# Patient Record
Sex: Male | Born: 1955 | Race: White | Hispanic: No | Marital: Single | Smoking: Former smoker
Health system: Southern US, Community
[De-identification: ages and names within clinical notes are randomized; demographics above are authoritative.]

## PROBLEM LIST (undated history)

## (undated) DIAGNOSIS — I219 Acute myocardial infarction, unspecified: Secondary | ICD-10-CM

## (undated) DIAGNOSIS — I639 Cerebral infarction, unspecified: Secondary | ICD-10-CM

## (undated) DIAGNOSIS — I10 Essential (primary) hypertension: Secondary | ICD-10-CM

## (undated) DIAGNOSIS — I6529 Occlusion and stenosis of unspecified carotid artery: Secondary | ICD-10-CM

## (undated) DIAGNOSIS — E785 Hyperlipidemia, unspecified: Secondary | ICD-10-CM

## (undated) DIAGNOSIS — I2510 Atherosclerotic heart disease of native coronary artery without angina pectoris: Secondary | ICD-10-CM

## (undated) DIAGNOSIS — I5022 Chronic systolic (congestive) heart failure: Secondary | ICD-10-CM

## (undated) DIAGNOSIS — I739 Peripheral vascular disease, unspecified: Secondary | ICD-10-CM

## (undated) DIAGNOSIS — E119 Type 2 diabetes mellitus without complications: Secondary | ICD-10-CM

## (undated) HISTORY — DX: Chronic systolic (congestive) heart failure: I50.22

## (undated) HISTORY — DX: Occlusion and stenosis of unspecified carotid artery: I65.29

## (undated) HISTORY — PX: ANKLE SURGERY: SHX546

## (undated) HISTORY — DX: Essential (primary) hypertension: I10

## (undated) HISTORY — DX: Hyperlipidemia, unspecified: E78.5

## (undated) HISTORY — DX: Type 2 diabetes mellitus without complications: E11.9

## (undated) HISTORY — DX: Atherosclerotic heart disease of native coronary artery without angina pectoris: I25.10

## (undated) HISTORY — DX: Acute myocardial infarction, unspecified: I21.9

## (undated) HISTORY — PX: OTHER SURGICAL HISTORY: SHX169

## (undated) HISTORY — DX: Cerebral infarction, unspecified: I63.9

## (undated) HISTORY — DX: Peripheral vascular disease, unspecified: I73.9

---

## 2000-02-04 ENCOUNTER — Inpatient Hospital Stay (HOSPITAL_COMMUNITY): Admission: AD | Admit: 2000-02-04 | Discharge: 2000-02-07 | Payer: Self-pay | Admitting: Cardiology

## 2000-04-19 ENCOUNTER — Encounter: Payer: Self-pay | Admitting: *Deleted

## 2000-04-19 ENCOUNTER — Inpatient Hospital Stay (HOSPITAL_COMMUNITY): Admission: EM | Admit: 2000-04-19 | Discharge: 2000-04-22 | Payer: Self-pay | Admitting: Emergency Medicine

## 2004-06-01 ENCOUNTER — Ambulatory Visit: Payer: Self-pay | Admitting: Family Medicine

## 2004-08-06 ENCOUNTER — Ambulatory Visit: Payer: Self-pay | Admitting: Family Medicine

## 2004-10-14 ENCOUNTER — Ambulatory Visit: Payer: Self-pay | Admitting: Family Medicine

## 2005-01-21 ENCOUNTER — Ambulatory Visit: Payer: Self-pay | Admitting: Family Medicine

## 2005-05-12 ENCOUNTER — Ambulatory Visit: Payer: Self-pay | Admitting: Family Medicine

## 2006-03-09 ENCOUNTER — Ambulatory Visit: Payer: Self-pay | Admitting: Family Medicine

## 2006-06-22 ENCOUNTER — Ambulatory Visit: Payer: Self-pay | Admitting: Family Medicine

## 2010-04-15 ENCOUNTER — Encounter: Payer: Self-pay | Admitting: Gastroenterology

## 2010-05-11 ENCOUNTER — Encounter (INDEPENDENT_AMBULATORY_CARE_PROVIDER_SITE_OTHER): Payer: Self-pay | Admitting: *Deleted

## 2010-05-13 ENCOUNTER — Ambulatory Visit
Admission: RE | Admit: 2010-05-13 | Discharge: 2010-05-13 | Payer: Self-pay | Source: Home / Self Care | Attending: Gastroenterology | Admitting: Gastroenterology

## 2010-05-13 ENCOUNTER — Encounter (INDEPENDENT_AMBULATORY_CARE_PROVIDER_SITE_OTHER): Payer: Self-pay | Admitting: *Deleted

## 2010-05-25 ENCOUNTER — Telehealth (INDEPENDENT_AMBULATORY_CARE_PROVIDER_SITE_OTHER): Payer: Self-pay | Admitting: *Deleted

## 2010-05-27 ENCOUNTER — Ambulatory Visit: Admit: 2010-05-27 | Payer: Self-pay | Admitting: Gastroenterology

## 2010-05-27 ENCOUNTER — Other Ambulatory Visit: Payer: Self-pay | Admitting: Gastroenterology

## 2010-05-27 ENCOUNTER — Other Ambulatory Visit (AMBULATORY_SURGERY_CENTER): Payer: BC Managed Care – PPO | Admitting: Gastroenterology

## 2010-05-27 DIAGNOSIS — Z1211 Encounter for screening for malignant neoplasm of colon: Secondary | ICD-10-CM

## 2010-05-27 DIAGNOSIS — K648 Other hemorrhoids: Secondary | ICD-10-CM

## 2010-05-27 DIAGNOSIS — D126 Benign neoplasm of colon, unspecified: Secondary | ICD-10-CM

## 2010-05-27 DIAGNOSIS — Z800 Family history of malignant neoplasm of digestive organs: Secondary | ICD-10-CM

## 2010-05-28 NOTE — Miscellaneous (Signed)
Summary: [facility] previsit  Clinical Lists Changes  Medications: Added new medication of MOVIPREP 100 GM  SOLR (PEG-KCL-NACL-NASULF-NA ASC-C) As per prep instructions. - Signed Rx of MOVIPREP 100 GM  SOLR (PEG-KCL-NACL-NASULF-NA ASC-C) As per prep instructions.;  #1 x 0;  Signed;  Entered by: Karl Bales RN;  Authorized by: Meryl Dare MD [facility];  Method used: Electronically to [facility] [id number]*, [address] [place] [place], [place]  [address], Ph: [phone], Fax: [phone] Observations: Added new observation of NKA: T (05/13/2010 11:59)    Prescriptions: MOVIPREP 100 GM  SOLR (PEG-KCL-NACL-NASULF-NA ASC-C) As per prep instructions.  #1 x 0   Entered by:   Karl Bales RN   Authorized by:   Meryl Dare MD [facility]   Signed by:   Karl Bales RN on 05/13/2010   Method used:   Electronically to        [facility] [phone]* (retail)       [address] [place].       [place], [place]  [address]       Ph: [phone]       Fax: [phone]   RxID:   [id number]

## 2010-05-28 NOTE — Letter (Signed)
Summary: [facility] Instructions  [facility]  [address] [place], [place] [address]   Phone: [phone]  Fax: [phone]       DARNELLE CORP    1955-10-30    MRN: [id number]        Procedure Day Dorna Bloom:  Wednesday 05/27/2010     Arrival Time:  9:30 am     Procedure Time: 10:30 am     Location of Procedure:                    _ x_  [facility] (4th Floor)                        PREPARATION FOR COLONOSCOPY WITH MOVIPREP   Starting 5 days prior to your procedure Friday 1/27 do not eat nuts, seeds, popcorn, corn, beans, peas,  salads, or any raw vegetables.  Do not take any fiber supplements (e.g. Metamucil, Citrucel, and Benefiber).  THE DAY BEFORE YOUR PROCEDURE         DATE: Tuesday 1/31  1.  Drink clear liquids the entire day-NO SOLID FOOD  2.  Do not drink anything colored red or purple.  Avoid juices with pulp.  No orange juice.  3.  Drink at least 64 oz. (8 glasses) of fluid/clear liquids during the day to prevent dehydration and help the prep work efficiently.  CLEAR LIQUIDS INCLUDE: Water Jello Ice Popsicles Tea (sugar ok, no milk/cream) Powdered fruit flavored drinks Coffee (sugar ok, no milk/cream) Gatorade Juice: apple, white grape, white cranberry  Lemonade Clear bullion, consomm, broth Carbonated beverages (any kind) Strained chicken noodle soup Hard Candy                             4.  In the morning, mix first dose of MoviPrep solution:    Empty 1 Pouch A and 1 Pouch B into the disposable container    Add lukewarm drinking water to the top line of the container. Mix to dissolve    Refrigerate (mixed solution should be used within 24 hrs)  5.  Begin drinking the prep at 5:00 p.m. The MoviPrep container is divided by 4 marks.   Every 15 minutes drink the solution down to the next mark (approximately 8 oz) until the full liter is complete.   6.  Follow completed prep with 16 oz of clear liquid of your choice (Nothing  red or purple).  Continue to drink clear liquids until bedtime.  7.  Before going to bed, mix second dose of MoviPrep solution:    Empty 1 Pouch A and 1 Pouch B into the disposable container    Add lukewarm drinking water to the top line of the container. Mix to dissolve    Refrigerate  THE DAY OF YOUR PROCEDURE      DATE: Wednesday 2/1  Beginning at 5:30 a.m. (5 hours before procedure):         1. Every 15 minutes, drink the solution down to the next mark (approx 8 oz) until the full liter is complete.  2. Follow completed prep with 16 oz. of clear liquid of your choice.    3. You may drink clear liquids until 8:30 am (2 HOURS BEFORE PROCEDURE).   MEDICATION INSTRUCTIONS  Unless otherwise instructed, you should take regular prescription medications with a small sip of water   as early as possible the morning of  your procedure.  Diabetic patients - see separate instructions.           OTHER INSTRUCTIONS  You will need a responsible adult at least 56 years of age to accompany you and drive you home.   This person must remain in the waiting room during your procedure.  Wear loose fitting clothing that is easily removed.  Leave jewelry and other valuables at home.  However, you may wish to bring a book to read or  an iPod/MP3 player to listen to music as you wait for your procedure to start.  Remove all body piercing jewelry and leave at home.  Total time from sign-in until discharge is approximately 2-3 hours.  You should go home directly after your procedure and rest.  You can resume normal activities the  day after your procedure.  The day of your procedure you should not:   Drive   Make legal decisions   Operate machinery   Drink alcohol   Return to work  You will receive specific instructions about eating, activities and medications before you leave.    The above instructions have been reviewed and explained to me by   Karl Bales RN  May 13, 2010 12:40 PM    I fully understand and can verbalize these instructions _____________________________ Date _________

## 2010-05-28 NOTE — Letter (Signed)
Summary: Diabetic Instructions  [facility]  [address]   [place], [place] [address]   Phone: [phone]  Fax: [phone]    Grant Moran [birth year] MRN: [id number]    x   ORAL DIABETIC MEDICATION INSTRUCTIONS  The day before your procedure:   Take your diabetic pill as you do normally  The day of your procedure:   Do not take your diabetic pill    We will check your blood sugar levels during the admission process and again in Recovery before discharging you home  ________________________________________________________________________

## 2010-05-28 NOTE — Letter (Signed)
Summary: Pre Visit Letter Revised  [facility]  [address] [place], [place] [address]   Phone: [phone]  Fax: [phone]        04/15/2010 MRN: [id number]  Grant Moran [address] [place], [place]  [address]             Procedure Date:  05-27-10 10:30am           Dr Russella Dar - Direct Colon      Welcome to the Gastroenterology Division at [facility].    You are scheduled to see a nurse for your pre-procedure visit on 05-13-2010 at 1pm on the 3rd floor at [facility], [address]  We ask that you try to arrive at our office 15 minutes prior to your appointment time to allow for check-in.  Please take a minute to review the attached form.  If you answer "Yes" to one or more of the questions on the first page, we ask that you call the person listed at your earliest opportunity.  If you answer "No" to all of the questions, please complete the rest of the form and bring it to your appointment.    Your nurse visit will consist of discussing your medical and surgical history, your immediate family medical history, and your medications.   If you are unable to list all of your medications on the form, please bring the medication bottles to your appointment and we will list them.  We will need to be aware of both prescribed and over the counter drugs.  We will need to know exact dosage information as well.    Please be prepared to read and sign documents such as consent forms, a financial agreement, and acknowledgement forms.  If necessary, and with your consent, a friend or relative is welcome to sit-in on the nurse visit with you.  Please bring your insurance card so that we may make a copy of it.  If your insurance requires a referral to see a specialist, please bring your referral form from your primary care physician.  No co-pay is required for this nurse visit.     If you cannot keep your appointment, please call [phone] to cancel or reschedule prior to  your appointment date.  This allows [place] the opportunity to schedule an appointment for another patient in need of care.    Thank you for choosing [facility] for your medical needs.  We appreciate the opportunity to care for you.  Please visit [place] at our website  to learn more about our practice.  Sincerely, The Gastroenterology Division

## 2010-06-01 ENCOUNTER — Encounter: Payer: Self-pay | Admitting: Gastroenterology

## 2010-06-01 LAB — GLUCOSE, CAPILLARY
Glucose-Capillary: 215 mg/dL — ABNORMAL HIGH (ref 70–99)
Glucose-Capillary: 150 mg/dL — ABNORMAL HIGH (ref 70–99)

## 2010-06-03 NOTE — Procedures (Addendum)
Summary: Colonoscopy  Patient: Grant Moran Note: All result statuses are Final unless otherwise noted.  Tests: (1) Colonoscopy (COL)   COL Colonoscopy           DONE (C)     [facility]     [address]     [place], [place]  [address]           COLONOSCOPY PROCEDURE REPORT     PATIENT:  Grant Moran, Grant Moran  MR#:  [id number]     BIRTHDATE:  July 04, 1955, 54 yrs. old  GENDER:  male     ENDOSCOPIST:  Judie Petit T. Russella Dar, MD, [facility]     Referred by:  Joette Catching, M.D.     PROCEDURE DATE:  05/27/2010     PROCEDURE:  Colonoscopy with snare polypectomy     ASA CLASS:  Class II     INDICATIONS:  1) Routine Risk Screening 2) Family history of colon     cancer: mother age 24 and MGM age 52.     MEDICATIONS:   Fentanyl 75 mcg IV, Versed 7 mg IV     DESCRIPTION OF PROCEDURE:   After the risks benefits and     alternatives of the procedure were thoroughly explained, informed     consent was obtained.  Digital rectal exam was performed and     revealed no abnormalities.   The LB [id number]-[id number] endoscope     was introduced through the anus and advanced to the cecum, which     was identified by both the appendix and ileocecal valve, without     limitations.  The quality of the prep was good, using MoviPrep.     The instrument was then slowly withdrawn as the colon was fully     examined.     <<PROCEDUREIMAGES>>     FINDINGS:  A sessile polyp was found in the mid transverse colon.     It was 10 mm in size. Polyp was snared, then cauterized with     monopolar cautery. Retrieval was successful. A normal appearing     cecum, ileocecal valve, and appendiceal orifice were identified.     The ascending, hepatic flexure, splenic flexure, descending,     sigmoid colon, and rectum appeared unremarkable. Retroflexed views     in the rectum revealed internal hemorrhoids, small. The time to     cecum =  1.33  minutes. The scope was then withdrawn (time =  9.5     min) from the patient and the procedure  completed.           COMPLICATIONS:  None           ENDOSCOPIC IMPRESSION:     1) 10 mm sessile polyp in the mid transverse colon     2) Internal hemorrhoids           RECOMMENDATIONS:     1) Avoid all NSAIDS for the next 2 weeks.     2) Await pathology results     3) If the polyp is adenomatous (pre-cancerous), colonoscopy in 3     years. Otherwise follow colorectal cancer screening guidelines for     "elevated risk" patients with a colonoscopy in 5 years.           Venita Lick. Russella Dar, MD, [facility]           n.     REVISED:  05/27/2010 11:35 AM     eSIGNED:   Venita Lick. Russella Dar at 05/27/2010  11:35 AM           Grant Moran, Grant Moran, [id number]  Note: An exclamation mark (!) indicates a result that was not dispersed into the flowsheet. Document Creation Date: 05/27/2010 11:36 AM _______________________________________________________________________  (1) Order result status: Final Collection or observation date-time: 05/27/2010 11:23 Requested date-time:  Receipt date-time:  Reported date-time:  Referring Physician:   Ordering Physician: Claudette Head [id number]) Specimen Source:  Source: Launa Grill Order Number: [id number] Lab site:   Appended Document: Colonoscopy     Procedures Next Due Date:    Colonoscopy: 05/2013

## 2010-06-03 NOTE — Progress Notes (Signed)
Summary: Prep  Phone Note Call from Patient Call back at Home Phone [phone]   Caller: Patient Call For: Dr. Russella Dar Reason for Call: Talk to Nurse Summary of Call: Pt needs [facility] Initial call taken by: [place] Johnson,  May 25, 2010 9:08 AM  Follow-up for Phone Call        [facility] MoviPrep [facility] [place], [place].  Pt. notified. Follow-up by: Ezra Sites RN,  May 25, 2010 9:27 AM

## 2010-06-11 NOTE — Letter (Signed)
Summary: Patient Notice- Polyp Results  [facility]  [address] [place], [place] [address]   Phone: [phone]  Fax: [phone]        June 01, 2010 MRN: [id number]    Grant Moran [address] [place], [place]  [address]    Dear Grant Moran,  I am pleased to inform you that the colon polyp(s) removed during your recent colonoscopy was (were) found to be benign (no cancer detected) upon pathologic examination.  I recommend you have a repeat colonoscopy examination in 3 years to look for recurrent polyps, as having colon polyps increases your risk for having recurrent polyps or even colon cancer in the future.  Should you develop new or worsening symptoms of abdominal pain, bowel habit changes or bleeding from the rectum or bowels, please schedule an evaluation with either your primary care physician or with me.  Continue treatment plan as outlined the day of your exam.  Please call us if you are having persistent problems or have questions about your condition that have not been fully answered at this time.  Sincerely,  Meryl Dare MD [facility]  This letter has been electronically signed by your physician.  Appended Document: Patient Notice- Polyp Results LETTER MAILED

## 2010-09-11 NOTE — Cardiovascular Report (Signed)
[facility]  Patient:    Grant Moran, Grant Moran                        MRN: [id number] Proc. Date: 02/04/00 Adm. Date:  [id number] Attending:  Learta Codding CC:         Luis Abed, M.D. [facility]  Delaney Meigs, M.D.   Cardiac Catheterization  PROCEDURES PERFORMED: 1. Left heart catheterization. 2. Left ventriculogram. 3. Selective coronary angiography. 4. Percutaneous transluminal coronary angioplasty and stenting of the    right coronary artery.  DIAGNOSES: 1. Two-vessel coronary artery disease. 2. Mild left ventricular systolic dysfunction. 3. Acute inferior wall myocardial infarction.  INDICATIONS:  Mr. Choyce is a 55 year old white male with multiple cardiac risks factors who presented to [facility] with substernal chest discomfort.  The patient awoke with discomfort that persisted throughout the day.  He presented to the emergency room early afternoon and was found to have ST elevation of the inferior leads consistent with an acute inferior wall myocardial infarction.  Cardiac enzymes were elevated as well.  The patient was given Integrilin and heparin and transferred to [facility] for urgent left heart catheterization.  TECHNIQUE:  After informed consent was obtained, the patient was brought to the cardiac catheterization lab where both groins were sterilely prepped and draped.  Lidocaine 1% was used to infiltrate the right groin.  The 5 French venous and 7 French arterial sheaths placed using modified Seldinger technique.  The 6 [place] and JR4 catheters were then used to engage the left and right coronary arteries, and selective angiography performed in various projections using manual injections of contrast.  A 6 French pigtail catheter was then advanced to the left ventricle and the left ventriculogram performed using power injections of contrast.  FINDINGS:  Initial findings are as follows: 1. Left main trunk:  The  left main trunk is a large caliber vessel with    mild irregularities. 2. Left anterior descending:  This is a large caliber vessel that provides two    diagonal branches.  The LAD has a high-grade narrowing of 70% in the    proximal segment. 3. Left circumflex artery:  This is a medium caliber vessel that provides    four marginal branches.  The left circumflex system has mild diffuse    disease with narrowings of 30% after the second marginal branch. 4. Ramus intermedius:  A medium caliber vessel with mild irregularities. 5. Right coronary artery:  A large caliber vessel that provides the posterior    descending artery and several posterior ventricular branches in its    terminal segment.  The right coronary artery has a long segment of severe    diffuse disease in the mid section with narrowings up to 95%.  This is an    eccentric and ulcerated looking lesion.  In addition there was further    narrowing of 70% in the distal RCA prior to the bifurcation with the PDA    and posterior ventricular branches of 70% with significant stepdown in the    vessel and possible intrinsic dissection.  LEFT VENTRICULOGRAM:  Normal end-systolic and end-diastolic dimensions. Overall left ventricular function is mildly impaired.  Ejection fraction approximately 40%.  Severe hypokinesis of the inferior wall.  No mitral regurgitation.  LV pressure is 113/10, aortic is 113/70.  LVEDP equals 25.  With these findings we elected to proceed with percutaneous intervention of the right coronary artery.  A 7 [place] JR4 guide catheter was used to engage the right coronary artery and a .014 inch Patriot wire advanced in the distal RCA.  A 3.5 x 20 mm Quantum balloon was introduced.  A single inflation was performed in the distal section of the lesion at 8 atmospheres for 30 seconds. Two additional inflations were performed in the mid and proximal segments of the lesion in the mid RCA at 12 atmospheres for 30  seconds.  Repeat angiography showed some improvement in vessel diameter.  However, there was evidence of dissection along the entire length of the mid RCA.  A 5.0 x 47 mm wall stent was introduced and carefully positioned in the mid RCA to cover the length of the lesion as well as the dissection deployed.  A 4.0 x 30 mm Maverick balloon was introduced and used to dilate the distal edge of the stent at 6 atmospheres for 30 seconds.  The mid section had 10 atmospheres for 30 seconds and the proximal segment had 12 atmospheres for 60 seconds.  There remained waste in the proximal mid section of the stent and a 4.0 x 15 mm Quantum Ranger balloon was introduced.  This was used to dilate the distal sections of the stent at 14 atmospheres x 30 seconds for two inflations in the proximal segment of the stent at 14 atmospheres for 30 seconds and within the mid section of the stent at up to 18 atmospheres for 30 seconds.  Repeat angiography showed an excellent result with only mild residual narrowing of less than 10% and full apposition of the stent.  Attention was then turned to the distal RCA lesion.  A second Patriot wire was advanced into the posterior descending artery in order to protect the bifurcation.  A 3.5 x 20 mm Quantum Ranger balloon was introduced and used to dilate the distal RCA prior to the bifurcation at 6 atmospheres for 120 seconds.  Repeat angiography showed mild improvement in vessel diameter.  However, there is persistence of vessel defect and it is felt that stenting would be necessary.  A 3.5 x 12 mm NIR stent was carefully positioned in the distal RCA and deployed just prior to the bifurcation with the PDA and posterior ventricular branch at 12 atmospheres for 45 seconds.  Repeat angiography showed vessel spasm and a diseased segment of the posterior ventricular branch just distal to the takeoff from the posterior descending artery.  A 3.5 x 20 mm Quantum balloon  was  positioned across the bifurcation into the posterior ventricular branch and used to dilate this segment at 4 atmospheres for 60 seconds.  Repeat angiography after administration of intracoronary nitroglycerin showed an excellent result with no residual stenosis in the distal RCA and excellent flow into the posterior descending artery and posterior ventricular branch with only mild residual narrowing.  Final angiography was performed in various projections showing TIMI-3 flow throughout the right coronary artery.  No residual vessel damage and only mild residual stenosis at the site of severe disease in the mid section of the RCA.  The guide catheter was then removed and the sheaths secured into position.  The patient tolerated the procedure well and was transferred to the ward in stable condition.  The sheath will be removed when the ACT returns to normal.  FINAL RESULTS: 1. Successful percutaneous transluminal coronary angioplasty and stenting    of the mid right coronary artery with reduction of a 95% narrowing    to less than 10% with placement  of a 5.0 x 47 mm wall stent dilated    to 4 mm. 2. Successful percutaneous transluminal coronary angioplasty and stenting    of the right coronary artery with reduction of 70% narrowing to 0%    with placement of a 3.5 x 12 mm NIR Elite stent.  ASSESSMENT AND PLAN:  Mr. Sanor is a 55 year old gentleman with severe two-vessel coronary artery disease, who has suffered an acute inferior wall myocardial infarction.  At this point he has been revascularized in the right coronary artery territory.  Further clinical assessment of the left anterior descending lesion will be made once the patient recovers from this procedure. DD:  02/04/00 TD:  02/05/00 Job: [id number]

## 2010-09-11 NOTE — Cardiovascular Report (Signed)
[facility]  Patient:    Grant Moran, Grant Moran                        MRN: [id number] Proc. Date: 04/20/00 Adm. Date:  [id number] Attending:  Rollene Rotunda CC:         Delaney Meigs, M.D.             Veneda Melter, M.D. [facility]             Rollene Rotunda, M.D. [facility]             [facility]                        Cardiac Catheterization  INDICATIONS:  [name] is a pleasant 55 year old who presents with recurrent unstable angina.  He has previously had a stent placed in his proximal right coronary artery over a long area.  He is also diabetic.  The current study was done to assess recurrent angina.  PROCEDURE 1. Left heart catheterization. 2. Selective coronary arteriography. 3. Selective left ventriculography. 4. Subclavian angiography.  DESCRIPTION OF PROCEDURE:  The procedure was performed from the right femoral artery using 6-French catheters.  He tolerated the procedure without complication.  He was taken to the holding area in satisfactory clinical condition.  Importantly, it was our feeling that he needed repeat dilatation of the right coronary stent because of long in-stent restenosis; however, the patient needs brachytherapy of the right coronary artery, and this is scheduled for Thursday afternoon on scheduled brachytherapy cases.  HEMODYNAMIC DATA:  Central aortic pressure 123/73.  LV pressure 125/2.  No gradient on pullback across the aortic valve.  ANGIOGRAPHIC DATA:  The left main coronary artery is short but free of critical disease.  The LAD demonstrates a long area of narrowing; it is probably from 50% to 70% in terms of luminal reduction.  This was seen on the previous angiographic study.  The distal vessel was a large-caliber vessel and free of critical disease and suitable for grafting.  The circumflex provides two marginal branches and terminates as an A-V circumflex.  There is perhaps 30-40% narrowing in the A-V  circumflex beyond the second marginal takeoff.  There is mild plaquing of the proximal second marginal without significant luminal reduction.  The right coronary artery demonstrates a long stent.  There is a long area of in-stent restenosis.  The stenosis is nearly subtotal and involves the area just proximal to the stent.  There is 80% in-stent restenosis in the proximal half of the stent.  The distal stent is widely patent.  There is also some bifurcational disease with decreased density at the bifurcation of the distal A-V portion of the right coronary artery and the posterior descending takeoff. The PDA takeoff and the A-V takeoff both have about 30% to 30-40% luminal reduction.  There is about 50% mid-PDA disease.  The subclavian and internal mammary appear to be widely patent.  Ventriculography in the RAO projection reveals inferobasal hypokinesis. Ejection fraction was 55%.  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  DISPOSITION:  The patient will be scheduled for repeat balloon dilatation with brachytherapy with Dr. Veneda Melter tomorrow.  We have added Plavix to his regimen. DD:  04/20/00 TD:  04/20/00 Job: [id number]

## 2010-09-11 NOTE — Cardiovascular Report (Signed)
[facility]  Patient:    Grant Moran, Grant Moran                        MRN: [id number] Proc. Date: 04/21/00 Adm. Date:  [id number] Attending:  Rollene Rotunda CC:         Rollene Rotunda, M.D. [facility]  Delaney Meigs, M.D.   Cardiac Catheterization  PROCEDURES PERFORMED: 1. Selective coronary angiography. 2. Percutaneous transluminal coronary angioplasty of the mid    right coronary artery. 3. Administration of brachytherapy of the right coronary artery.  DIAGNOSES: 1. Severe single-vessel coronary artery disease. 2. 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  INDICATIONS:  [name] is a 55 year old white male with advanced coronary artery disease, who had suffered an inferior wall myocardial infarction on February 04, 2000.  At that time he had acute intervention of the right coronary artery with placement of a stent in the mid section.  He recently presented with recurrence of chest discomfort and on catheterization April 20, 2000, was found to have severe narrowings of 97% in the proximal segment of the stent consistent with in-stent re-stenosis.  He presents now for percutaneous intervention.  TECHNIQUE:  After informed consent was obtained from the patient, and he was brought to the cardiac catheterization lab.  An 8 French sheath was placed in the right femoral artery.  An 8 [place] JR4 guide catheter was used as was a long BMW wire.  The patient received heparin and Integrilin on a weight-adjusted basis.  He had previously been started on aspirin and Plavix. A 4.0 x 10 mm Cutting Balloon was then introduced and multiple inflations performed in the mid RCA in the area of severe narrowing and up to 10 atmospheres for 60 seconds.  Repeat angiography showed significant improvement in vessel lumen with only mild residual narrowing of 20% and no evidence of vessel damage.  Preparations were then made for brachytherapy and the delivery catheter positioned across the  stenosis.  Beta-radiation was then delivered using the Novoste system for dual time of four minutes.  Repeat angiography showed no evidence of vessel damage and TIMI-3 flow throughout the right coronary artery.  The guide catheter was then removed and the sheaths secured into position.  The patient tolerated the procedure well and was transferred to the floor in stable condition.  FINAL RESULTS:  Successful percutaneous transluminal coronary angioplasty of the mid right coronary artery with reduction of 90% in-stent re-stenosis to less than 20% with a 4 mm Cutting Balloon and adjuvant administration of beta-radiation brachytherapy. DD:  04/21/00 TD:  04/21/00 Job: [id number]

## 2010-09-11 NOTE — Discharge Summary (Signed)
. [facility]  Patient:    Grant Moran, PAVON                        MRN: [id number] Adm. Date:  [id number] Disc. Date: 04/22/00 Attending:  Rollene Rotunda Dictator:   Joellyn Rued, P.A.-C. CC:         Dr. Sheila Oats - [address], [place], [place] [address]                           Discharge Summary  DATE OF BIRTH:  1956/02/18  HISTORY OF PRESENT ILLNESS:  [name] is a 55 year old white male who presented to [facility] Emergency Room, complaining of chest discomfort.  He developed right-sided anterior chest sharp pains, radiating into the right arm as numbing.  He found it similar to his inferior myocardial infarction in October 2001, but "this is not as bad."  He said that the discomfort started yesterday, and lasted for only 10 minutes.  He had taken Maalox; however, the discomfort recurred at around 5:30 a.m. while eating breakfast.  He took more Maalox and Tums.  Throughout the day it has been "coming and going" all day.  Throughout the day the discomfort has been anywhere from a 0 to a 5 on a scale of 0/10.  The discomfort is worse with exertion, better with rest.  He did use one sublingual nitroglycerin today, but without relief.  He states that he is reluctant to use nitroglycerin. Associated with this is some nausea.  He has had some slight coughing, and some shortness of breath.  He denies any diaphoresis or vomiting.  PAST MEDICAL HISTORY: 1. Notable for remote tobacco abuse, with a prior three-pack-per-day-history    for 20 years.  He has not smoked since 1992. 2. Notable for non-insulin-dependent diabetes mellitus. 3. Hypertension. 4. Hyperlipidemia.  The last cholesterol checked was in October.  Total    cholesterol 132, triglycerides 229, HDL 22, LDL 64. 5. Inferior myocardial infarction in October 2001.  A cardiac    catheterization showed a long mid-95% RCA, a 70% distal PDA, a  70%    proximal LAD, a 30% lesion, post the OM-II, with an ejection fraction of    40%, and inferior hypokinesis.  He underwent an angioplasty and stenting    of the RCA and distal RCA.  LABORATORY DATA:  On presentation to the emergency room the CPK was 169 with MB of 4.5, relative index of 2.7, troponin 0.09.  CPKs rose to a peak of 150, MB 9.3, relative index 6.2, and troponin 0.56, approximately eight hours after admission.  Fasting lipids showed a total cholesterol of 134, triglycerides of 205, HDL 31, LDL 62.  H&H was 14.5 and 39.0, normal MCV.  The MCHC was slightly elevated at 37.2, platelets 268, wbcs 9.0.  PT 11.9, PTT 68.  Sodium in the emergency room was 138, potassium 4.3, BUN 11, creatinine 1.0.  Electrocardiogram on admission showed normal sinus rhythm, some nonspecific ST-T wave changes.  An electrocardiogram on April 20, 2000, showed the development of Q-waves in lead III and aVF.  Lead II had 1.0 to 2.0 mm inferior ST segment elevation.  There was T-wave inversion in lead III and aVF.  Further electrocardiograms showed persistent Q-waves in lead III and aVF and T-wave inversion.  A chest x-ray showed poor inspiration, no active disease.  HOSPITAL COURSE:  Mr. [name] was admitted to the hospital.  He was placed on IV nitroglycerin and heparin.  His home medications were continued except for his GlucoVance.  On April 19, 2000, it was noted that he complained of chest discomfort, and his nitroglycerin was increased, with a resolution of his discomfort.  On April 20, 2000, his enzymes and electrocardiograms were positive for an inferior myocardial infarction.  The cardiac catheterization on April 20, 2000, by Dr. Arturo Morton. Stuckey.  According to his progress note, this showed a 99% in-stent restenosis of the proximal RCA.  He had a distal 30% PDA and a 30%-40% posterolateral, a distal 50% PDA.  His proximal lesion in the LAD was estimated as between 50%-70%,  and the distal circumflex was 30%-50%.  The ejection fraction was 55% with inferior hypokinesis.  Films were reviewed, and it was felt that he should undergo radiation treatment for his RCA stent restenosis.  Radiology/oncology saw him on April 21, 2000. That afternoon Dr. Veneda Melter performed an angioplasty to the RCA in-stent restenosis, reducing this from 90% to 20%.  Beta radiation was also performed with a four-minute dwell time.  DISPOSITION:  By April 22, 2000, he was ambulating without difficulty.  The cardiac catheterization site was intact.  Dr. Chales Abrahams reviewed the chart, and felt that he could be discharged home.  DISCHARGE DIAGNOSES: 1. Inferior myocardial infarction.  He had a small enzyme peak; however,    his electrocardiogram did develop inferior Q-waves, status post    angioplasty of the in-stent restenosis of the right coronary artery,    associated with beta radiation. 2. Hyperlipidemia. 3. History as previously.  DISCHARGE MEDICATIONS: 1. Plavix 75 mg q.d. for six months. 2. Niacin was increased to 1000 mg b.i.d. 3. He was asked to resume his GlucoVance 5/500 mg two tablets q.a.m.,    one tablet q.p.m. on April 23, 2000. 4. He is asked to continue his coated aspirin 325 mg q.d. 5. Monopril 20 mg q.d. 6. Toprol XL 100 mg q.d. 7. Sublingual nitroglycerin p.r.n.  INSTRUCTIONS:  He was advised no lifting, driving, sexual activity, or heavy exertion for two days, and he was given permission to return to work on Monday.  If he has any problems with his cardiac catheterization site, he is instructed to call.  DIET:  He is to maintain a low-salt, low-fat, low-cholesterol, ADA diet.  FOLLOWUP:  He will see Dr. Andee Lineman in the [facility] on May 16, 2000, at 2 p.m. DD:  04/22/00 TD:  04/22/00 Job: [id number]

## 2011-03-26 ENCOUNTER — Encounter: Payer: Self-pay | Admitting: Cardiovascular Disease

## 2011-11-16 ENCOUNTER — Ambulatory Visit (INDEPENDENT_AMBULATORY_CARE_PROVIDER_SITE_OTHER): Payer: BC Managed Care – PPO | Admitting: Cardiovascular Disease

## 2011-11-16 ENCOUNTER — Encounter: Payer: Self-pay | Admitting: Cardiovascular Disease

## 2011-11-16 VITALS — BP 112/74 | HR 84 | Resp 12 | Ht 64.0 in | Wt 203.8 lb

## 2011-11-16 DIAGNOSIS — I2510 Atherosclerotic heart disease of native coronary artery without angina pectoris: Secondary | ICD-10-CM | POA: Insufficient documentation

## 2011-11-16 HISTORY — DX: Atherosclerotic heart disease of native coronary artery without angina pectoris: I25.10

## 2011-11-16 NOTE — Patient Instructions (Addendum)
Your physician wants you to follow-up in: 1 year. You will receive a reminder letter in the mail two months in advance. If you don't receive a letter, please call our office to schedule the follow-up appointment.  

## 2011-11-16 NOTE — Progress Notes (Signed)
History of Present Illness: 56 yo male with history of CAD, HLD, HTN who is here today as a new patient for cardiac evaluation. He was seen by Dr. Antoine Poche 10 years ago but has not been seen by cardiology since. His cardiac history dates back to 2001 when he presented with an inferior MI. His RCA had an ulcerated lesion treated with a bare metal stent. He was also noted to have a 70% proximal LAD lesion, 30% OM2, 70% PDA, 70% PLA, 70% distal RCA. He was readmitted 04/21/2000 with chest pain and had severe restenosis RCA stent treated with angioplasty and brachytherapy. Lost to cardiology follow up since then with no workup. He has not been taking ASA due to ulcers and off of Plavix, statin and Ace-inh.   He feels well. No chest pain, SOB or palpitations. ROS o/w negative. He works in Education officer, environmental and rides horses for fun. Not married. Former smoker. He does not wish to have any cardiac testing.   Primary Care Physician: [facility]   Past Medical History  Diagnosis Date  . Hypertension   . Hyperlipidemia   . Myocardial infarction     OCT 2001-He under went angioplasty  and  stenting  of the RCA and distal RCA   . Diabetes mellitus   . CAD (coronary artery disease)     Past Surgical History  Procedure Date  . Ankle surgery     Trauma    Current Outpatient Prescriptions on File Prior to Visit  Medication Sig Dispense Refill  . glyBURIDE-metformin (GLUCOVANCE) 5-500 MG per tablet Take 1 tablet by mouth 2 (two) times daily with a meal.      . metoprolol succinate (TOPROL-XL) 100 MG 24 hr tablet Take 100 mg by mouth daily. Take with or immediately following a meal.      . nitroGLYCERIN (NITROSTAT) 0.4 MG SL tablet Place 0.4 mg under the tongue every 5 (five) minutes as needed.                                       No Known Allergies  History   Social History  . Marital Status: Single    Spouse Name: N/A    Number of Children: 0  . Years of Education: N/A   Occupational History    . Textile plant    Social History Main Topics  . Smoking status: Former Smoker -- 2.0 packs/day for 20 years    Types: Cigarettes    Quit date: 06/18/1990  . Smokeless tobacco: Not on file  . Alcohol Use: No  . Drug Use: No  . Sexually Active: Not on file   Other Topics Concern  . Not on file   Social History Narrative   Notable for remote tobacco abuse, with a prior three-pack per - day - history  For 20 years . He  Has not smoked since 1992.    Family History  Problem Relation Age of Onset  . Cancer Mother     Colon  . Heart attack Father     CABG  . Heart attack Sister     Review of Systems:  As stated in the HPI and otherwise negative.   BP 112/74  Pulse 84  Resp 12  Ht 5\' 4"  (1.626 m)  Wt 203 lb 12.8 oz (92.443 kg)  BMI 34.98 kg/m2  Physical Examination: General: Well developed, well nourished, NAD HEENT:  OP clear, mucus membranes moist SKIN: warm, dry. No rashes. Neuro: No focal deficits Musculoskeletal: Muscle strength 5/5 all ext Psychiatric: Mood and affect normal Neck: No JVD, no carotid bruits, no thyromegaly, no lymphadenopathy. Lungs:Clear bilaterally, no wheezes, rhonci, crackles Cardiovascular: Regular rate and rhythm. No murmurs, gallops or rubs. Abdomen:Soft. Bowel sounds present. Non-tender.  Extremities: No lower extremity edema. Pulses are 2 + in the bilateral DP/PT.   EKG: NSR, rate 76 bpm.

## 2011-11-16 NOTE — Assessment & Plan Note (Signed)
He has multi-vessel CAD by cath in 2001 with 70% LAD lesion, stent restenosis in mid RCA with 70% lesions PDA and PLA, 30% OM2. He is taking Toprol but not ASA secondary to "ulcers". He does not wish to start a statin. I have discussed arranging a cardiac stress test given his level of disease with no workup in 12 years. He is asymptomatic and refuses any cardiac workup including echo, stress test or cath. I have asked him to f/u in one year or sooner if needed. He says he may not as "doctors are just out to get my money". I have given him my card.

## 2013-05-01 ENCOUNTER — Encounter: Payer: Self-pay | Admitting: Gastroenterology

## 2013-11-08 ENCOUNTER — Encounter: Payer: Self-pay | Admitting: Gastroenterology

## 2015-05-01 ENCOUNTER — Emergency Department (HOSPITAL_COMMUNITY): Payer: BLUE CROSS/BLUE SHIELD

## 2015-05-01 ENCOUNTER — Inpatient Hospital Stay (HOSPITAL_COMMUNITY): Payer: BLUE CROSS/BLUE SHIELD

## 2015-05-01 ENCOUNTER — Encounter (HOSPITAL_COMMUNITY): Payer: Self-pay

## 2015-05-01 ENCOUNTER — Inpatient Hospital Stay (HOSPITAL_COMMUNITY)
Admission: EM | Admit: 2015-05-01 | Discharge: 2015-05-03 | DRG: 065 | Disposition: A | Discharge status: Home / Self Care | Payer: BLUE CROSS/BLUE SHIELD | Attending: Internal Medicine | Admitting: Internal Medicine

## 2015-05-01 DIAGNOSIS — I6340 Cerebral infarction due to embolism of unspecified cerebral artery: Secondary | ICD-10-CM | POA: Diagnosis not present

## 2015-05-01 DIAGNOSIS — Z87891 Personal history of nicotine dependence: Secondary | ICD-10-CM | POA: Diagnosis not present

## 2015-05-01 DIAGNOSIS — I5022 Chronic systolic (congestive) heart failure: Secondary | ICD-10-CM | POA: Diagnosis present

## 2015-05-01 DIAGNOSIS — I25119 Atherosclerotic heart disease of native coronary artery with unspecified angina pectoris: Secondary | ICD-10-CM | POA: Diagnosis not present

## 2015-05-01 DIAGNOSIS — Z79899 Other long term (current) drug therapy: Secondary | ICD-10-CM | POA: Diagnosis not present

## 2015-05-01 DIAGNOSIS — Z7984 Long term (current) use of oral hypoglycemic drugs: Secondary | ICD-10-CM | POA: Diagnosis not present

## 2015-05-01 DIAGNOSIS — E119 Type 2 diabetes mellitus without complications: Secondary | ICD-10-CM

## 2015-05-01 DIAGNOSIS — H4920 Sixth [abducent] nerve palsy, unspecified eye: Secondary | ICD-10-CM | POA: Insufficient documentation

## 2015-05-01 DIAGNOSIS — I63412 Cerebral infarction due to embolism of left middle cerebral artery: Secondary | ICD-10-CM | POA: Diagnosis not present

## 2015-05-01 DIAGNOSIS — E1141 Type 2 diabetes mellitus with diabetic mononeuropathy: Secondary | ICD-10-CM | POA: Diagnosis present

## 2015-05-01 DIAGNOSIS — I252 Old myocardial infarction: Secondary | ICD-10-CM

## 2015-05-01 DIAGNOSIS — I2510 Atherosclerotic heart disease of native coronary artery without angina pectoris: Secondary | ICD-10-CM | POA: Diagnosis present

## 2015-05-01 DIAGNOSIS — I639 Cerebral infarction, unspecified: Secondary | ICD-10-CM | POA: Diagnosis present

## 2015-05-01 DIAGNOSIS — H4921 Sixth [abducent] nerve palsy, right eye: Secondary | ICD-10-CM | POA: Diagnosis present

## 2015-05-01 DIAGNOSIS — I6789 Other cerebrovascular disease: Secondary | ICD-10-CM | POA: Diagnosis not present

## 2015-05-01 DIAGNOSIS — Z955 Presence of coronary angioplasty implant and graft: Secondary | ICD-10-CM | POA: Diagnosis not present

## 2015-05-01 DIAGNOSIS — I10 Essential (primary) hypertension: Secondary | ICD-10-CM

## 2015-05-01 DIAGNOSIS — H532 Diplopia: Secondary | ICD-10-CM | POA: Diagnosis present

## 2015-05-01 DIAGNOSIS — E785 Hyperlipidemia, unspecified: Secondary | ICD-10-CM | POA: Diagnosis present

## 2015-05-01 DIAGNOSIS — Z8673 Personal history of transient ischemic attack (TIA), and cerebral infarction without residual deficits: Secondary | ICD-10-CM | POA: Insufficient documentation

## 2015-05-01 DIAGNOSIS — I6349 Cerebral infarction due to embolism of other cerebral artery: Secondary | ICD-10-CM | POA: Diagnosis not present

## 2015-05-01 DIAGNOSIS — I6300 Cerebral infarction due to thrombosis of unspecified precerebral artery: Secondary | ICD-10-CM | POA: Diagnosis not present

## 2015-05-01 LAB — URINALYSIS, ROUTINE W REFLEX MICROSCOPIC
Bilirubin Urine: NEGATIVE
HGB URINE DIPSTICK: NEGATIVE
KETONES UR: 15 mg/dL — AB
LEUKOCYTES UA: NEGATIVE
Nitrite: NEGATIVE
PROTEIN: NEGATIVE mg/dL
Specific Gravity, Urine: 1.02 (ref 1.005–1.030)
pH: 5 (ref 5.0–8.0)

## 2015-05-01 LAB — PROTIME-INR
INR: 0.93 (ref 0.00–1.49)
Prothrombin Time: 12.7 seconds (ref 11.6–15.2)

## 2015-05-01 LAB — I-STAT CHEM 8, ED
BUN: 15 mg/dL (ref 6–20)
Calcium, Ion: 0.95 mmol/L — ABNORMAL LOW (ref 1.13–1.30)
Chloride: 106 mmol/L (ref 101–111)
Creatinine, Ser: 0.6 mg/dL — ABNORMAL LOW (ref 0.61–1.24)
GLUCOSE: 254 mg/dL — AB (ref 65–99)
HEMATOCRIT: 39 % (ref 39.0–52.0)
Hemoglobin: 13.3 g/dL (ref 13.0–17.0)
POTASSIUM: 3.7 mmol/L (ref 3.5–5.1)
SODIUM: 136 mmol/L (ref 135–145)
TCO2: 18 mmol/L (ref 0–100)

## 2015-05-01 LAB — COMPREHENSIVE METABOLIC PANEL
ALBUMIN: 4 g/dL (ref 3.5–5.0)
ALK PHOS: 104 U/L (ref 38–126)
ALT: 24 U/L (ref 17–63)
AST: 18 U/L (ref 15–41)
Anion gap: 14 (ref 5–15)
BILIRUBIN TOTAL: 0.7 mg/dL (ref 0.3–1.2)
BUN: 15 mg/dL (ref 6–20)
CO2: 21 mmol/L — ABNORMAL LOW (ref 22–32)
CREATININE: 0.68 mg/dL (ref 0.61–1.24)
Calcium: 9.2 mg/dL (ref 8.9–10.3)
Chloride: 103 mmol/L (ref 101–111)
GFR calc Af Amer: >60 mL/min (ref >60)
GLUCOSE: 227 mg/dL — AB (ref 65–99)
POTASSIUM: 3.6 mmol/L (ref 3.5–5.1)
Sodium: 138 mmol/L (ref 135–145)
TOTAL PROTEIN: 7.4 g/dL (ref 6.5–8.1)

## 2015-05-01 LAB — RAPID URINE DRUG SCREEN, HOSP PERFORMED
Amphetamines: NOT DETECTED
BARBITURATES: NOT DETECTED
Benzodiazepines: NOT DETECTED
COCAINE: NOT DETECTED
Opiates: NOT DETECTED
TETRAHYDROCANNABINOL: NOT DETECTED

## 2015-05-01 LAB — URINE MICROSCOPIC-ADD ON: RBC / HPF: NONE SEEN RBC/hpf (ref 0–5)

## 2015-05-01 LAB — DIFFERENTIAL
BASOS ABS: 0 10*3/uL (ref 0.0–0.1)
Basophils Relative: 1 %
EOS ABS: 0.2 10*3/uL (ref 0.0–0.7)
Eosinophils Relative: 3 %
LYMPHS ABS: 2 10*3/uL (ref 0.7–4.0)
LYMPHS PCT: 30 %
MONOS PCT: 12 %
Monocytes Absolute: 0.8 10*3/uL (ref 0.1–1.0)
NEUTROS ABS: 3.6 10*3/uL (ref 1.7–7.7)
NEUTROS PCT: 54 %

## 2015-05-01 LAB — CBC
HEMATOCRIT: 39.6 % (ref 39.0–52.0)
HEMOGLOBIN: 14.1 g/dL (ref 13.0–17.0)
MCH: 31.1 pg (ref 26.0–34.0)
MCHC: 35.6 g/dL (ref 30.0–36.0)
MCV: 87.4 fL (ref 78.0–100.0)
Platelets: 258 10*3/uL (ref 150–400)
RBC: 4.53 MIL/uL (ref 4.22–5.81)
RDW: 13.3 % (ref 11.5–15.5)
WBC: 6.6 10*3/uL (ref 4.0–10.5)

## 2015-05-01 LAB — GLUCOSE, CAPILLARY: GLUCOSE-CAPILLARY: 306 mg/dL — AB (ref 65–99)

## 2015-05-01 LAB — I-STAT TROPONIN, ED: Troponin i, poc: 0.01 ng/mL (ref 0.00–0.08)

## 2015-05-01 LAB — TROPONIN I

## 2015-05-01 LAB — APTT: APTT: 24 s (ref 24–37)

## 2015-05-01 LAB — ETHANOL: Alcohol, Ethyl (B): <5 mg/dL (ref <5)

## 2015-05-01 MED ORDER — METOPROLOL SUCCINATE ER 100 MG PO TB24
100.0000 mg | ORAL_TABLET | Freq: Every day | ORAL | Status: DC
  Administered 2015-05-02 – 2015-05-03 (×2): 100 mg via ORAL
  Filled 2015-05-01 (×2): qty 1

## 2015-05-01 MED ORDER — METFORMIN HCL 500 MG PO TABS: 500.0000 mg | ORAL_TABLET | Freq: Two times a day (BID) | ORAL | Status: DC

## 2015-05-01 MED ORDER — INSULIN ASPART 100 UNIT/ML ~~LOC~~ SOLN
0.0000 [IU] | Freq: Every day | SUBCUTANEOUS | Status: DC
  Administered 2015-05-01: 4 [IU] via SUBCUTANEOUS

## 2015-05-01 MED ORDER — GLYBURIDE-METFORMIN 5-500 MG PO TABS: 1.0000 | ORAL_TABLET | Freq: Two times a day (BID) | ORAL | Status: DC

## 2015-05-01 MED ORDER — ENOXAPARIN SODIUM 40 MG/0.4ML ~~LOC~~ SOLN
40.0000 mg | SUBCUTANEOUS | Status: DC
  Administered 2015-05-02: 40 mg via SUBCUTANEOUS
  Filled 2015-05-01: qty 0.4

## 2015-05-01 MED ORDER — ASPIRIN 81 MG PO CHEW
324.0000 mg | CHEWABLE_TABLET | Freq: Once | ORAL | Status: AC
  Administered 2015-05-01: 324 mg via ORAL
  Filled 2015-05-01: qty 4

## 2015-05-01 MED ORDER — CANAGLIFLOZIN 300 MG PO TABS
300.0000 mg | ORAL_TABLET | Freq: Every day | ORAL | Status: DC
  Filled 2015-05-01: qty 300

## 2015-05-01 MED ORDER — SODIUM CHLORIDE 0.9 % IV SOLN: INTRAVENOUS | Status: DC

## 2015-05-01 MED ORDER — STROKE: EARLY STAGES OF RECOVERY BOOK
Freq: Once | Status: AC
  Administered 2015-05-01: 22:00:00
  Filled 2015-05-01: qty 1

## 2015-05-01 MED ORDER — ASPIRIN 325 MG PO TABS
325.0000 mg | ORAL_TABLET | Freq: Every day | ORAL | Status: DC
  Administered 2015-05-02 – 2015-05-03 (×2): 325 mg via ORAL
  Filled 2015-05-01 (×2): qty 1

## 2015-05-01 MED ORDER — NITROGLYCERIN 0.4 MG SL SUBL: 0.4000 mg | SUBLINGUAL_TABLET | SUBLINGUAL | Status: DC | PRN

## 2015-05-01 MED ORDER — GLYBURIDE 5 MG PO TABS
5.0000 mg | ORAL_TABLET | Freq: Two times a day (BID) | ORAL | Status: DC
  Administered 2015-05-02 – 2015-05-03 (×3): 5 mg via ORAL
  Filled 2015-05-01 (×5): qty 1

## 2015-05-01 MED ORDER — FENOFIBRATE 160 MG PO TABS
160.0000 mg | ORAL_TABLET | Freq: Every day | ORAL | Status: DC
  Administered 2015-05-02 – 2015-05-03 (×2): 160 mg via ORAL
  Filled 2015-05-01 (×2): qty 1

## 2015-05-01 MED ORDER — INSULIN ASPART 100 UNIT/ML ~~LOC~~ SOLN
0.0000 [IU] | Freq: Three times a day (TID) | SUBCUTANEOUS | Status: DC
  Administered 2015-05-02 (×2): 3 [IU] via SUBCUTANEOUS
  Administered 2015-05-02 – 2015-05-03 (×2): 8 [IU] via SUBCUTANEOUS
  Administered 2015-05-03: 5 [IU] via SUBCUTANEOUS

## 2015-05-01 NOTE — ED Provider Notes (Signed)
Discussed patient's MRI results and physical findings with on-call neuro hospitalists at [facility]. That is Dr. Silverio Decamp.   Neuro hospitalists is more comfortable with the patient being admitted to the hospitalist service at [facility]. Discussed with her hospitalists here they will see the patient and arrange the transfer. Patient's symptoms have improved.  MRI also shows evidence of remote strokes. Patient's symptoms were predominantly right eye double vision nurse at work noted drifting of the right eye. Patient had to cover the right eye to drive. With the right eye [place] vision was gone. Patient denies any other symptoms. He symptoms been present since Tuesday morning.  Fredia Sorrow, MD 05/01/15 1731

## 2015-05-01 NOTE — Consult Note (Signed)
Referring Physician: [facility]    Chief Complaint: blurred vision, double vision, stroke on MRI  HPI:                                                                                                                                         Grant Moran is an 60 y.o. male with a past medical history significant for HTN, HLD, DM, CAD s/p stent deployment, MI, transferred to [facility] for further stroke evaluation-management. He initially presented to [facility] with complains of blurred and double vision x 2 days. Never had similar symptoms before. Patient tells me that last Tuesday he woke up feeling well, ate breakfast, and while driving to work developed sudden onset blurred vision as well as " seeing double every time I moved my head, but with disappearance of double vision by closing an eye". Recalls having forehead pain at that time, but denies associated vertigo, difficulty swallowing, slurred speech, unsteadiness, focal weakness or numbness, confusion, or language impairment. Grant Moran said that he took his blood sugar at home and was above 400. Overall, he believes that his vision is improving. MRI brain revealed 2 tiny areas of small infarctions left frontal lobe as well as an acute small linear infarct left occipital lobe. MRA with intracranial atherosclerotic changes of branch vessels posterior and anterior circulation. Reviewed available serologies, UA, UDS, and ETOH level are unremarkable.  Patient started on aspirin.  Date last known well: 04/29/15 Time last known well: 8 am tPA Given: no, out of the window   Past Medical History  Diagnosis Date  . Hypertension   . Hyperlipidemia   . Myocardial infarction ([place])     OCT 2001-He under went angioplasty  and  stenting  of the RCA and distal RCA   . Diabetes mellitus ([place])   . CAD (coronary artery disease)     Past Surgical History  Procedure Laterality Date  . Ankle surgery      Trauma  . Stent sx    . Fx left arm      Family History   Problem Relation Age of Onset  . Cancer Mother     Colon  . Heart attack Father     CABG  . Heart attack Sister    Social History:  reports that he quit smoking about 24 years ago. His smoking use included Cigarettes. He has a 40 pack-year smoking history. He does not have any smokeless tobacco history on file. He reports that he does not drink alcohol or use illicit drugs. Family history: no MS, brain tumor, or brain aneurysm Allergies: No Known Allergies  Medications:  Scheduled: . aspirin  325 mg Oral Daily  . [START ON 05/02/2015] canagliflozin  300 mg Oral Daily  . enoxaparin (LOVENOX) injection  40 mg Subcutaneous Q24H  . [START ON 05/02/2015] fenofibrate  160 mg Oral Daily  . [START ON 05/02/2015] glyBURIDE  5 mg Oral BID WC   And  . [START ON 05/02/2015] metFORMIN  500 mg Oral BID WC  . [START ON 05/02/2015] insulin aspart  0-15 Units Subcutaneous TID WC  . insulin aspart  0-5 Units Subcutaneous QHS  . [START ON 05/02/2015] metoprolol succinate  100 mg Oral Daily    ROS:                                                                                                                                       History obtained from chart review and the patient  General ROS: negative for - chills, fatigue, fever, night sweats, or weight loss Psychological ROS: negative for - behavioral disorder, hallucinations, memory difficulties, mood swings or suicidal ideation Ophthalmic ROS: negative for - eye pain or loss of vision ENT ROS: negative for - epistaxis, nasal discharge, oral lesions, sore throat, tinnitus or vertigo Allergy and Immunology ROS: negative for - hives or itchy/watery eyes Hematological and Lymphatic ROS: negative for - bleeding problems, bruising or swollen lymph nodes Endocrine ROS: negative for - galactorrhea, hair pattern changes, polydipsia/polyuria or  temperature intolerance Respiratory ROS: negative for - cough, hemoptysis, shortness of breath or wheezing Cardiovascular ROS: negative for - chest pain, dyspnea on exertion, edema or irregular heartbeat Gastrointestinal ROS: negative for - abdominal pain, diarrhea, hematemesis, nausea/vomiting or stool incontinence Genito-Urinary ROS: negative for - dysuria, hematuria, incontinence or urinary frequency/urgency Musculoskeletal ROS: negative for - joint swelling or muscular weakness Neurological ROS: as noted in HPI Dermatological ROS: negative for rash and skin lesion changes    Physical exam:  Constitutional: well developed, pleasant male in no apparent distress. Blood pressure 127/80, pulse 88, temperature 97.9 F (36.6 C), temperature source Oral, resp. rate 18, height 5' 3"  (1.6 m), weight 81.194 kg (179 lb), SpO2 97 %. Eyes: no jaundice or exophthalmos.  Head: normocephalic. Neck: supple, no bruits, no JVD. Cardiac: no murmurs. Lungs: clear. Abdomen: soft, no tender, no mass. Extremities: no edema, clubbing, or cyanosis.  Skin: no rash  Neurologic Examination:                                                                                                      General:  NAD Mental Status: Alert, oriented, thought content appropriate.  Speech fluent without evidence of aphasia.  Able to follow 3 step commands without difficulty. Cranial Nerves: II: Discs flat bilaterally; Visual fields grossly normal, pupils equal, round, reactive to light and accommodation III,IV, VI: ptosis not present, extra-ocular motions intact bilaterally V,VII: smile symmetric, facial light touch sensation normal bilaterally VIII: hearing normal bilaterally IX,X: uvula rises symmetrically XI: bilateral shoulder shrug XII: midline tongue extension without atrophy or fasciculations Motor: Right : Upper extremity   5/5    Left:     Upper extremity   5/5  Lower extremity   5/5     Lower extremity   5/5 Tone  and bulk:normal tone throughout; no atrophy noted Sensory: Pinprick and light touch intact throughout, bilaterally Deep Tendon Reflexes:  Right: Upper Extremity   Left: Upper extremity   biceps (C-5 to C-6) 2/4   biceps (C-5 to C-6) 2/4 tricep (C7) 2/4    triceps (C7) 2/4 Brachioradialis (C6) 2/4  Brachioradialis (C6) 2/4  Lower Extremity Lower Extremity  quadriceps (L-2 to L-4) 2/4   quadriceps (L-2 to L-4) 2/4 Achilles (S1) 2/4   Achilles (S1) 2/4  Plantars: Right: downgoing   Left: downgoing Cerebellar: normal finger-to-nose,  normal heel-to-shin test Gait:  No tested due to multiple leads      Results for orders placed or performed during the hospital encounter of 05/01/15 (from the past 48 hour(s))  I-stat chem 8, ed     Status: Abnormal   Collection Time: 05/01/15  3:43 PM  Result Value Ref Range   Sodium 136 135 - 145 mmol/L   Potassium 3.7 3.5 - 5.1 mmol/L   Chloride 106 101 - 111 mmol/L   BUN 15 6 - 20 mg/dL   Creatinine, Ser 0.60 (L) 0.61 - 1.24 mg/dL   Glucose, Bld 254 (H) 65 - 99 mg/dL   Calcium, Ion 0.95 (L) 1.13 - 1.30 mmol/L   TCO2 18 0 - 100 mmol/L   Hemoglobin 13.3 13.0 - 17.0 g/dL   HCT 39.0 39.0 - 52.0 %  Protime-INR     Status: None   Collection Time: 05/01/15  3:47 PM  Result Value Ref Range   Prothrombin Time 12.7 11.6 - 15.2 seconds   INR 0.93 0.00 - 1.49  APTT     Status: None   Collection Time: 05/01/15  3:47 PM  Result Value Ref Range   aPTT 24 24 - 37 seconds  CBC     Status: None   Collection Time: 05/01/15  3:47 PM  Result Value Ref Range   WBC 6.6 4.0 - 10.5 K/uL   RBC 4.53 4.22 - 5.81 MIL/uL   Hemoglobin 14.1 13.0 - 17.0 g/dL   HCT 39.6 39.0 - 52.0 %   MCV 87.4 78.0 - 100.0 fL   MCH 31.1 26.0 - 34.0 pg   MCHC 35.6 30.0 - 36.0 g/dL   RDW 13.3 11.5 - 15.5 %   Platelets 258 150 - 400 K/uL  Differential     Status: None   Collection Time: 05/01/15  3:47 PM  Result Value Ref Range   Neutrophils Relative % 54 %   Neutro Abs 3.6  1.7 - 7.7 K/uL   Lymphocytes Relative 30 %   Lymphs Abs 2.0 0.7 - 4.0 K/uL   Monocytes Relative 12 %   Monocytes Absolute 0.8 0.1 - 1.0 K/uL   Eosinophils Relative 3 %   Eosinophils Absolute 0.2 0.0 - 0.7 K/uL  Basophils Relative 1 %   Basophils Absolute 0.0 0.0 - 0.1 K/uL  Comprehensive metabolic panel     Status: Abnormal   Collection Time: 05/01/15  3:47 PM  Result Value Ref Range   Sodium 138 135 - 145 mmol/L   Potassium 3.6 3.5 - 5.1 mmol/L   Chloride 103 101 - 111 mmol/L   CO2 21 (L) 22 - 32 mmol/L   Glucose, Bld 227 (H) 65 - 99 mg/dL   BUN 15 6 - 20 mg/dL   Creatinine, Ser 0.68 0.61 - 1.24 mg/dL   Calcium 9.2 8.9 - 10.3 mg/dL   Total Protein 7.4 6.5 - 8.1 g/dL   Albumin 4.0 3.5 - 5.0 g/dL   AST 18 15 - 41 U/L   ALT 24 17 - 63 U/L   Alkaline Phosphatase 104 38 - 126 U/L   Total Bilirubin 0.7 0.3 - 1.2 mg/dL   GFR calc non Af Amer >60 >60 mL/min   GFR calc Af Amer >60 >60 mL/min    Comment: (NOTE) The eGFR has been calculated using the CKD EPI equation. This calculation has not been validated in all clinical situations. eGFR's persistently <60 mL/min signify possible Chronic Kidney Disease.    Anion gap 14 5 - 15  Troponin I     Status: None   Collection Time: 05/01/15  3:47 PM  Result Value Ref Range   Troponin I <0.03 <0.031 ng/mL    Comment:        NO INDICATION OF MYOCARDIAL INJURY.   Ethanol     Status: None   Collection Time: 05/01/15  3:57 PM  Result Value Ref Range   Alcohol, Ethyl (B) <5 <5 mg/dL    Comment:        LOWEST DETECTABLE LIMIT FOR SERUM ALCOHOL IS 5 mg/dL FOR MEDICAL PURPOSES ONLY   Urine rapid drug screen (hosp performed)not at [facility]     Status: None   Collection Time: 05/01/15  4:36 PM  Result Value Ref Range   Opiates NONE DETECTED NONE DETECTED   Cocaine NONE DETECTED NONE DETECTED   Benzodiazepines NONE DETECTED NONE DETECTED   Amphetamines NONE DETECTED NONE DETECTED   Tetrahydrocannabinol NONE DETECTED NONE DETECTED    Barbiturates NONE DETECTED NONE DETECTED    Comment:        DRUG SCREEN FOR MEDICAL PURPOSES ONLY.  IF CONFIRMATION IS NEEDED FOR ANY PURPOSE, NOTIFY LAB WITHIN 5 DAYS.        LOWEST DETECTABLE LIMITS FOR URINE DRUG SCREEN Drug Class       Cutoff (ng/mL) Amphetamine      1000 Barbiturate      200 Benzodiazepine   329 Tricyclics       518 Opiates          300 Cocaine          300 THC              50   Urinalysis, Routine w reflex microscopic (not at [facility])     Status: Abnormal   Collection Time: 05/01/15  4:36 PM  Result Value Ref Range   Color, Urine YELLOW YELLOW   APPearance CLEAR CLEAR   Specific Gravity, Urine 1.020 1.005 - 1.030   pH 5.0 5.0 - 8.0   Glucose, UA >1000 (A) NEGATIVE mg/dL   Hgb urine dipstick NEGATIVE NEGATIVE   Bilirubin Urine NEGATIVE NEGATIVE   Ketones, ur 15 (A) NEGATIVE mg/dL   Protein, ur NEGATIVE NEGATIVE mg/dL   Nitrite  NEGATIVE NEGATIVE   Leukocytes, UA NEGATIVE NEGATIVE  Urine microscopic-add on     Status: Abnormal   Collection Time: 05/01/15  4:36 PM  Result Value Ref Range   Squamous Epithelial / LPF 0-5 (A) NONE SEEN   WBC, UA 0-5 0 - 5 WBC/hpf   RBC / HPF NONE SEEN 0 - 5 RBC/hpf   Bacteria, UA RARE (A) NONE SEEN  I-stat troponin, ED (not at [facility])     Status: None   Collection Time: 05/01/15  4:40 PM  Result Value Ref Range   Troponin i, poc 0.01 0.00 - 0.08 ng/mL   Comment 3            Comment: Due to the release kinetics of cTnI, a negative result within the first hours of the onset of symptoms does not rule out myocardial infarction with certainty. If myocardial infarction is still suspected, repeat the test at appropriate intervals.   Glucose, capillary     Status: Abnormal   Collection Time: 05/01/15  9:36 PM  Result Value Ref Range   Glucose-Capillary 306 (H) 65 - 99 mg/dL   Comment 1 Notify RN    Comment 2 Document in Chart    Dg Chest 2 View  05/01/2015  CLINICAL DATA:  History of hypertension and diabetes. Blurred  vision. EXAM: CHEST  2 VIEW COMPARISON:  Patient's prior chest x-ray from December 2001 is not available for comparison FINDINGS: The heart size and mediastinal contours are within normal limits. There is no focal infiltrate, pulmonary edema, or pleural effusion. There is chronic elevation of right hemi The visualized skeletal structures are unremarkable. IMPRESSION: No active cardiopulmonary disease. Electronically Signed   By: Abelardo Diesel M.D.   On: 05/01/2015 18:32   Mr Jodene Nam Head Wo Contrast  05/01/2015  CLINICAL DATA:  60 year old hypertensive diabetic male with visual changes. Subsequent encounter. EXAM: MRA HEAD WITHOUT CONTRAST TECHNIQUE: Angiographic images of the Circle of Willis were obtained using MRA technique without intravenous contrast. COMPARISON:  05/01/2015 brain MR. FINDINGS: Minimal narrowing of the cavernous segment of the internal carotid artery bilaterally. No significant stenosis of the carotid terminus. No significant stenosis M1 segment or middle cerebral artery bifurcation either side. Middle cerebral artery branch vessel irregularity with regions of mild to moderate narrowing bilaterally. Hypoplastic A1 segment left anterior cerebral artery. Fetal contribution to the left posterior cerebral artery. Ectatic vertebral arteries bilaterally with ectasia more notable on the right. Mild narrowing irregularity of portions of the distal left vertebral artery. Posterior cerebral artery distal branch vessel narrowing and irregularity bilaterally. Mildly ectatic basilar artery without significant stenosis. Nonvisualized anterior inferior cerebellar arteries. Narrowing of the right superior cerebellar artery. Posterior cerebral artery distal branches with moderate narrowing and irregularity bilaterally. No aneurysm noted. IMPRESSION: Intracranial atherosclerotic type changes most notable involving branch vessels as detailed above. No proximal major vessel significant stenosis or occlusion.  Hypoplastic A1 segment left anterior cerebral artery. Prominent ectasia vertebral arteries with mild ectasia and basilar artery. Electronically Signed   By: Genia Del M.D.   On: 05/01/2015 21:08   Mr Brain Wo Contrast  05/01/2015  CLINICAL DATA:  60 year old hypertensive diabetic male with diplopia and right-sided headache. No known injury. Initial encounter. EXAM: MRI HEAD WITHOUT CONTRAST TECHNIQUE: Multiplanar, multiecho pulse sequences of the brain and surrounding structures were obtained without intravenous contrast. COMPARISON:  None. FINDINGS: Small linear infarct medial aspect left occipital lobe may explain the patient's visual disturbance. Two tiny infarcts left frontal lobe. Although the  left frontal lobe infarcts appear nodular, in the present clinical setting, metastatic lesions causing restricted motion is felt unlikely. Remote left lenticular nucleus and right caudate head infarct. Mild chronic small vessel disease type changes. No intracranial hemorrhage. No intracranial mass lesion noted on this unenhanced exam. No hydrocephalus. Major intracranial vascular structures are patent. Cervical medullary junction, pituitary region, pineal region and orbital structures unremarkable. Minimal partial opacification right mastoid air cells. IMPRESSION: Small linear infarct medial aspect left occipital lobe may explain the patient's visual disturbance. Two tiny infarcts left frontal lobe. Remote left lenticular nucleus and right caudate head infarct. Mild chronic small vessel disease type changes. No intracranial hemorrhage. Electronically Signed   By: Genia Del M.D.   On: 05/01/2015 15:38    Assessment: 61 y.o. male with visual complains x 2 days and MRI revealing tiny acute infarctions left frontal and left occipital lobe respectively that could be embolic. Neuro-exam is unimpressive. Patient did not received iv tPA due to late presentation. Complete stroke work up. Aspirin pending results  stroke work up. Stroke team will resume care tomorrow.  Stroke Risk Factors -  HTN, HLD, DM, CAD   Plan: 1. HgbA1c, fasting lipid panel 2. MRI, MRA  of the brain without contrast 3. Echocardiogram 4. Carotid dopplers 5. Prophylactic therapy-aspirin 6. Risk factor modification 7. Telemetry monitoring 8. Frequent neuro checks 9. PT/OT SLP 10. NPO   Dorian Pod, MD [facility] [phone]  05/01/2015, 10:12 PM

## 2015-05-01 NOTE — Progress Notes (Signed)
Pt arrived to room 5M07 via [facility]link. Pt is alert and oriented.  Safety measures in place.  Will continue to monitor.  Fredrich Romans, RN

## 2015-05-01 NOTE — ED Provider Notes (Signed)
CSN: [id number]     Arrival date & time 05/01/15  1414 History   First MD Initiated Contact with Patient 05/01/15 1431     Chief Complaint  Patient presents with  . Blurred Vision     (Consider location/radiation/quality/duration/timing/severity/associated sxs/prior Treatment) HPI Complains of mildly blurred vision onset 2 days ago and reports diplopia when looking from side to side onset 2 days ago. He had a mild ache over right eyebrow earlier today, no headache presently. Denies pain presently. His vision normalized since driving here. He is presently asymptomatic. No treatment prior to coming Past Medical History  Diagnosis Date  . Hypertension   . Hyperlipidemia   . Myocardial infarction ([place])     OCT 2001-He under went angioplasty  and  stenting  of the RCA and distal RCA   . Diabetes mellitus ([place])   . CAD (coronary artery disease)    Past Surgical History  Procedure Laterality Date  . Ankle surgery      Trauma  . Stent sx    . Fx left arm     Family History  Problem Relation Age of Onset  . Cancer Mother     Colon  . Heart attack Father     CABG  . Heart attack Sister    Social History  Substance Use Topics  . Smoking status: Former Smoker -- 2.00 packs/day for 20 years    Types: Cigarettes    Quit date: 06/18/1990  . Smokeless tobacco: None  . Alcohol Use: No    Review of Systems  Constitutional: Negative.   Eyes: Positive for visual disturbance.  Respiratory: Negative.   Cardiovascular: Negative.   Gastrointestinal: Negative.   Musculoskeletal: Negative.   Skin: Negative.   Allergic/Immunologic: Positive for immunocompromised state.       Diabetic  Neurological: Positive for headaches.       Mild headache at right eyebrow which has since resolved  Psychiatric/Behavioral: Negative.   All other systems reviewed and are negative.     Allergies  Review of patient's allergies indicates no known allergies.  Home Medications   Prior to Admission  medications   Medication Sig Start Date End Date Taking? Authorizing Provider  aspirin 325 MG tablet Take 325 mg by mouth daily.    Historical Provider, MD  clopidogrel (PLAVIX) 75 MG tablet Take 75 mg by mouth daily.    Historical Provider, MD  fosinopril (MONOPRIL) 20 MG tablet Take 20 mg by mouth daily.    Historical Provider, MD  glyBURIDE-metformin (GLUCOVANCE) 5-500 MG per tablet Take 1 tablet by mouth 2 (two) times daily with a meal.    Historical Provider, MD  metoprolol succinate (TOPROL-XL) 100 MG 24 hr tablet Take 100 mg by mouth daily. Take with or immediately following a meal.    Historical Provider, MD  niacin (NIASPAN) 1000 MG CR tablet Take 1,000 mg by mouth at bedtime.    Historical Provider, MD  nitroGLYCERIN (NITROSTAT) 0.4 MG SL tablet Place 0.4 mg under the tongue every 5 (five) minutes as needed.    Historical Provider, MD   BP 160/76 mmHg  Pulse 93  Temp(Src) 97.8 F (36.6 C)  Resp 20  Ht 5\' 3"  (1.6 m)  Wt 179 lb (81.194 kg)  BMI 31.72 kg/m2  SpO2 98% Physical Exam  Constitutional: He is oriented to person, place, and time. He appears well-developed and well-nourished.  HENT:  Head: Normocephalic and atraumatic.  Eyes: Conjunctivae are normal. Pupils are equal, round, and reactive to  light.  No some conjunctival erythema  Neck: Neck supple. No tracheal deviation present. No thyromegaly present.  Cardiovascular: Normal rate and regular rhythm.   No murmur heard. Pulmonary/Chest: Effort normal and breath sounds normal.  Abdominal: Soft. Bowel sounds are normal. He exhibits no distension. There is no tenderness.  Musculoskeletal: Normal range of motion. He exhibits no edema or tenderness.  Neurological: He is alert and oriented to person, place, and time. He displays normal reflexes. No cranial nerve deficit. Coordination normal.  Gait normal motor strength 5 over 5 overall  Skin: Skin is warm and dry. No rash noted.  Psychiatric: He has a normal mood and affect.   Nursing note and vitals reviewed.   ED Course  Procedures (including critical care time) Labs Review Labs Reviewed - No data to display  Imaging Review No results found. I have personally reviewed and evaluated these images and lab results as part of my medical decision-making.   EKG Interpretation   Date/Time:  Thursday May 01 2015 16:11:46 EST Ventricular Rate:  83 PR Interval:  167 QRS Duration: 110 QT Interval:  373 QTC Calculation: 438 R Axis:   37 Text Interpretation:  Sinus rhythm Borderline T abnormalities, lateral  leads Minimal ST elevation, anterior leads lateral twave chenges New since  previous tracing Confirmed by Winfred Leeds  MD, Satoya Feeley [phone]) on 05/01/2015  4:14:12 PM     Results for orders placed or performed during the hospital encounter of 05/01/15  CBC  Result Value Ref Range   WBC 6.6 4.0 - 10.5 K/uL   RBC 4.53 4.22 - 5.81 MIL/uL   Hemoglobin 14.1 13.0 - 17.0 g/dL   HCT 39.6 39.0 - 52.0 %   MCV 87.4 78.0 - 100.0 fL   MCH 31.1 26.0 - 34.0 pg   MCHC 35.6 30.0 - 36.0 g/dL   RDW 13.3 11.5 - 15.5 %   Platelets 258 150 - 400 K/uL  Differential  Result Value Ref Range   Neutrophils Relative % 54 %   Neutro Abs 3.6 1.7 - 7.7 K/uL   Lymphocytes Relative 30 %   Lymphs Abs 2.0 0.7 - 4.0 K/uL   Monocytes Relative 12 %   Monocytes Absolute 0.8 0.1 - 1.0 K/uL   Eosinophils Relative 3 %   Eosinophils Absolute 0.2 0.0 - 0.7 K/uL   Basophils Relative 1 %   Basophils Absolute 0.0 0.0 - 0.1 K/uL  I-stat chem 8, ed  Result Value Ref Range   Sodium 136 135 - 145 mmol/L   Potassium 3.7 3.5 - 5.1 mmol/L   Chloride 106 101 - 111 mmol/L   BUN 15 6 - 20 mg/dL   Creatinine, Ser 0.60 (L) 0.61 - 1.24 mg/dL   Glucose, Bld 254 (H) 65 - 99 mg/dL   Calcium, Ion 0.95 (L) 1.13 - 1.30 mmol/L   TCO2 18 0 - 100 mmol/L   Hemoglobin 13.3 13.0 - 17.0 g/dL   HCT 39.0 39.0 - 52.0 %   Mr Brain Wo Contrast  05/01/2015  CLINICAL DATA:  60 year old hypertensive diabetic male  with diplopia and right-sided headache. No known injury. Initial encounter. EXAM: MRI HEAD WITHOUT CONTRAST TECHNIQUE: Multiplanar, multiecho pulse sequences of the brain and surrounding structures were obtained without intravenous contrast. COMPARISON:  None. FINDINGS: Small linear infarct medial aspect left occipital lobe may explain the patient's visual disturbance. Two tiny infarcts left frontal lobe. Although the left frontal lobe infarcts appear nodular, in the present clinical setting, metastatic lesions causing restricted motion is  felt unlikely. Remote left lenticular nucleus and right caudate head infarct. Mild chronic small vessel disease type changes. No intracranial hemorrhage. No intracranial mass lesion noted on this unenhanced exam. No hydrocephalus. Major intracranial vascular structures are patent. Cervical medullary junction, pituitary region, pineal region and orbital structures unremarkable. Minimal partial opacification right mastoid air cells. IMPRESSION: Small linear infarct medial aspect left occipital lobe may explain the patient's visual disturbance. Two tiny infarcts left frontal lobe. Remote left lenticular nucleus and right caudate head infarct. Mild chronic small vessel disease type changes. No intracranial hemorrhage. Electronically Signed   By: Genia Del M.D.   On: 05/01/2015 15:38    MDM  Patient likely with subacute stroke taste on physical complaint and MRI scan. Aspirin ordered  patient's passes swallow screen patient signed out to Dr.Zackowski 4 PM Final diagnoses:  None  Dx#1 subacute stroke #2 hyperglycemia      Orlie Dakin, MD 05/01/15 1614

## 2015-05-01 NOTE — H&P (Signed)
[facility] History and Physical  Grant Moran [id number] DOB: 06-Mar-1956 DOA: 05/01/2015  Referring physician: ER PCP: Sherrie Mustache, MD   Chief Complaint: Blurred vision  HPI: Grant Moran is a 60 y.o. male  This is a 60 year old man who has a history of coronary artery disease, diabetes who now presents with a 2 day history of episodes of blurred vision and double vision. No headache or nausea or vomiting.Difficulties. He did not have any limb difficulties or gait difficulties either. He also has hyperlipidemia and hypertension. Evaluation in the emergency room shows him to have multiple strokes on the MRI, in particular the left septal lobe, which may explain his symptoms. He is now being admitted for further investigation and management.   Review of Systems:  Apart from symptoms above, all systems are negative.  Past Medical History  Diagnosis Date  . Hypertension   . Hyperlipidemia   . Myocardial infarction ([place])     OCT 2001-He under went angioplasty  and  stenting  of the RCA and distal RCA   . Diabetes mellitus ([place])   . CAD (coronary artery disease)    Past Surgical History  Procedure Laterality Date  . Ankle surgery      Trauma  . Stent sx    . Fx left arm     Social History:  reports that he quit smoking about 24 years ago. His smoking use included Cigarettes. He has a 40 pack-year smoking history. He does not have any smokeless tobacco history on file. He reports that he does not drink alcohol or use illicit drugs.  No Known Allergies  Family History  Problem Relation Age of Onset  . Cancer Mother     Colon  . Heart attack Father     CABG  . Heart attack Sister     Prior to Admission medications   Medication Sig Start Date End Date Taking? Authorizing Provider  fenofibrate 160 MG tablet Take 160 mg by mouth daily. 04/02/15 04/01/16 Yes Historical Provider, MD  glyBURIDE-metformin (GLUCOVANCE) 5-500 MG tablet Take 1 tablet by mouth 2 (two)  times daily.   Yes Historical Provider, MD  INVOKANA 300 MG TABS tablet Take 300 mg by mouth daily. 04/16/15  Yes Historical Provider, MD  metoprolol succinate (TOPROL-XL) 100 MG 24 hr tablet Take 100 mg by mouth daily. Take with or immediately following a meal.   Yes Historical Provider, MD  nitroGLYCERIN (NITROSTAT) 0.4 MG SL tablet Place 0.4 mg under the tongue every 5 (five) minutes as needed for chest pain.     Historical Provider, MD   Physical Exam: Filed Vitals:   05/01/15 1612 05/01/15 1624 05/01/15 1700 05/01/15 1730  BP:  133/84 135/82 146/78  Pulse:  85 83 87  Temp: 97.8 F (36.6 C) 97.8 F (36.6 C)    TempSrc:  Oral    Resp:  18 14 15   Height:      Weight:      SpO2:  96% 95% 96%    Wt Readings from Last 3 Encounters:  05/01/15 81.194 kg (179 lb)  05/01/15 81.194 kg (179 lb)  11/16/11 92.443 kg (203 lb 12.8 oz)    General:  Appears calm and comfortable. He is alert and oriented. Eyes: PERRL, normal lids, irises & conjunctiva ENT: grossly normal hearing, lips & tongue Neck: no LAD, masses or thyromegaly Cardiovascular: RRR, no m/r/g. No LE edema. Telemetry: SR, no arrhythmias  Respiratory: CTA bilaterally, no w/r/r. Normal respiratory effort. Abdomen: soft,  ntnd Skin: no rash or induration seen on limited exam Musculoskeletal: grossly normal tone BUE/BLE Psychiatric: grossly normal mood and affect, speech fluent and appropriate Neurologic: grossly non-focal. no obvious neurological signs except the left eye does not seem to be center.           Labs on Admission:  Basic Metabolic Panel:  Recent Labs Lab 05/01/15 1543 05/01/15 1547  NA 136 138  K 3.7 3.6  CL 106 103  CO2  --  21*  GLUCOSE 254* 227*  BUN 15 15  CREATININE 0.60* 0.68  CALCIUM  --  9.2   Liver Function Tests:  Recent Labs Lab 05/01/15 1547  AST 18  ALT 24  ALKPHOS 104  BILITOT 0.7  PROT 7.4  ALBUMIN 4.0   No results for input(s): LIPASE, AMYLASE in the last 168 hours. No  results for input(s): AMMONIA in the last 168 hours. CBC:  Recent Labs Lab 05/01/15 1543 05/01/15 1547  WBC  --  6.6  NEUTROABS  --  3.6  HGB 13.3 14.1  HCT 39.0 39.6  MCV  --  87.4  PLT  --  258   Cardiac Enzymes:  Recent Labs Lab 05/01/15 1547  TROPONINI <0.03    BNP (last 3 results) No results for input(s): BNP in the last 8760 hours.  ProBNP (last 3 results) No results for input(s): PROBNP in the last 8760 hours.  CBG: No results for input(s): GLUCAP in the last 168 hours.  Radiological Exams on Admission: Mr Herby Abraham Contrast  05/01/2015  CLINICAL DATA:  60 year old hypertensive diabetic male with diplopia and right-sided headache. No known injury. Initial encounter. EXAM: MRI HEAD WITHOUT CONTRAST TECHNIQUE: Multiplanar, multiecho pulse sequences of the brain and surrounding structures were obtained without intravenous contrast. COMPARISON:  None. FINDINGS: Small linear infarct medial aspect left occipital lobe may explain the patient's visual disturbance. Two tiny infarcts left frontal lobe. Although the left frontal lobe infarcts appear nodular, in the present clinical setting, metastatic lesions causing restricted motion is felt unlikely. Remote left lenticular nucleus and right caudate head infarct. Mild chronic small vessel disease type changes. No intracranial hemorrhage. No intracranial mass lesion noted on this unenhanced exam. No hydrocephalus. Major intracranial vascular structures are patent. Cervical medullary junction, pituitary region, pineal region and orbital structures unremarkable. Minimal partial opacification right mastoid air cells. IMPRESSION: Small linear infarct medial aspect left occipital lobe may explain the patient's visual disturbance. Two tiny infarcts left frontal lobe. Remote left lenticular nucleus and right caudate head infarct. Mild chronic small vessel disease type changes. No intracranial hemorrhage. Electronically Signed   By: Genia Del  M.D.   On: 05/01/2015 15:38    EKG:  Sinus rhythm without any acute ST-T wave changes.  Assessment/Plan   1. CVA. He appears to have multiple infarcts, as seen on the MRI above. We will obtain a stroke workup. Neurology consultation. Neurologist felt that the patient should be transferred to [facility] for further investigation there and we will arrange this. 2. Diabetes. Continue with home medications and sliding scale insulin. 3. Hypertension. Stable. Continue with home medications.  He will be admitted to [facility] in the telemetry bed. Further recommendations will depend on patient's hospital progress.   Code Status: Full code.  DVT Prophylaxis: Lovenox.  Family Communication: I discussed the plan with the patient at the bedside.   Disposition Plan: Home when medically stable.  Time spent: 60 minutes.  Doree Albee [facility] Pager [phone].

## 2015-05-01 NOTE — ED Notes (Signed)
Pt taken to MR before i stat could be drawn.

## 2015-05-01 NOTE — ED Notes (Signed)
Pt reports that he noticed blurred vision and double vision on Tuesday prior to going to work. Slight HA over right eye. Nurse at his employment noticed that right eye was crossed so he was sent here

## 2015-05-02 ENCOUNTER — Inpatient Hospital Stay (HOSPITAL_COMMUNITY): Payer: BLUE CROSS/BLUE SHIELD

## 2015-05-02 ENCOUNTER — Other Ambulatory Visit: Payer: Self-pay | Admitting: Cardiology

## 2015-05-02 DIAGNOSIS — I639 Cerebral infarction, unspecified: Secondary | ICD-10-CM

## 2015-05-02 DIAGNOSIS — I2510 Atherosclerotic heart disease of native coronary artery without angina pectoris: Secondary | ICD-10-CM

## 2015-05-02 DIAGNOSIS — I63412 Cerebral infarction due to embolism of left middle cerebral artery: Secondary | ICD-10-CM

## 2015-05-02 DIAGNOSIS — I6789 Other cerebrovascular disease: Secondary | ICD-10-CM

## 2015-05-02 DIAGNOSIS — H4920 Sixth [abducent] nerve palsy, unspecified eye: Secondary | ICD-10-CM | POA: Insufficient documentation

## 2015-05-02 DIAGNOSIS — I25119 Atherosclerotic heart disease of native coronary artery with unspecified angina pectoris: Secondary | ICD-10-CM

## 2015-05-02 DIAGNOSIS — I5022 Chronic systolic (congestive) heart failure: Secondary | ICD-10-CM

## 2015-05-02 LAB — GLUCOSE, CAPILLARY
GLUCOSE-CAPILLARY: 188 mg/dL — AB (ref 65–99)
Glucose-Capillary: 195 mg/dL — ABNORMAL HIGH (ref 65–99)
Glucose-Capillary: 264 mg/dL — ABNORMAL HIGH (ref 65–99)
Glucose-Capillary: 174 mg/dL — ABNORMAL HIGH (ref 65–99)

## 2015-05-02 MED ORDER — PERFLUTREN LIPID MICROSPHERE
1.0000 mL | INTRAVENOUS | Status: AC | PRN
  Administered 2015-05-02: 3 mL via INTRAVENOUS
  Filled 2015-05-02: qty 10

## 2015-05-02 MED ORDER — LOSARTAN POTASSIUM 50 MG PO TABS
50.0000 mg | ORAL_TABLET | Freq: Every day | ORAL | Status: DC
  Administered 2015-05-02 – 2015-05-03 (×2): 50 mg via ORAL
  Filled 2015-05-02: qty 1

## 2015-05-02 NOTE — Progress Notes (Signed)
  Echocardiogram 2D Echocardiogram has been performed.  Grant Moran 05/02/2015, 10:18 AM

## 2015-05-02 NOTE — Progress Notes (Addendum)
STROKE TEAM PROGRESS NOTE   HISTORY YUNIOR BOXX is an 60 y.o. male with a past medical history significant for HTN, HLD, DM, CAD s/p stent deployment, MI, transferred to [facility] for further stroke evaluation-management. He initially presented to [facility] with complains of blurred and double vision x 2 days. Never had similar symptoms before. Patient tells me that last Tuesday he woke up feeling well, ate breakfast, and while driving to work developed sudden onset blurred vision as well as " seeing double every time I moved my head, but with disappearance of double vision by closing an eye". Recalls having forehead pain at that time, but denies associated vertigo, difficulty swallowing, slurred speech, unsteadiness, focal weakness or numbness, confusion, or language impairment. Mr. Wragg said that he took his blood sugar at home and was above 400. Overall, he believes that his vision is improving. MRI brain revealed 2 tiny areas of small infarctions left frontal lobe as well as an acute small linear infarct left occipital lobe. MRA with intracranial atherosclerotic changes of branch vessels posterior and anterior circulation. Reviewed available serologies, UA, UDS, and ETOH level are unremarkable. Patient started on aspirin. He was last known well 04/29/15 at 8 am. Patient was not administered TPA secondary to out of the window. He was admitted for further evaluation and treatment.   SUBJECTIVE (INTERVAL HISTORY) His son is at the bedside.  Overall he feels his condition is gradually improving. Denies previous hx of the same. 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   OBJECTIVE Temp:  [97.7 F (36.5 C)-98.5 F (36.9 C)] 97.7 F (36.5 C) (01/06 1020) Pulse Rate:  [80-93] 80 (01/06 1020) Cardiac Rhythm:  [-] Normal sinus rhythm (01/06 0806) Resp:  [9-20] 16 (01/06 1020) BP: (116-160)/(68-94) 142/70 mmHg (01/06 1020) SpO2:  [94 %-98 %] 94 % (01/06 1020) Weight:  [81.194  kg (179 lb)] 81.194 kg (179 lb) (01/05 1459)  CBC:   Recent Labs Lab 05/01/15 1543 05/01/15 1547  WBC  --  6.6  NEUTROABS  --  3.6  HGB 13.3 14.1  HCT 39.0 39.6  MCV  --  87.4  PLT  --  0000000    Basic Metabolic Panel:   Recent Labs Lab 05/01/15 1543 05/01/15 1547  NA 136 138  K 3.7 3.6  CL 106 103  CO2  --  21*  GLUCOSE 254* 227*  BUN 15 15  CREATININE 0.60* 0.68  CALCIUM  --  9.2    Lipid Panel: No results found for: CHOL, TRIG, HDL, CHOLHDL, VLDL, LDLCALC HgbA1c: No results found for: HGBA1C Urine Drug Screen:     Component Value Date/Time   LABOPIA NONE DETECTED 05/01/2015 1636   COCAINSCRNUR NONE DETECTED 05/01/2015 1636   LABBENZ NONE DETECTED 05/01/2015 1636   AMPHETMU NONE DETECTED 05/01/2015 1636   THCU NONE DETECTED 05/01/2015 1636   LABBARB NONE DETECTED 05/01/2015 1636      IMAGING I have personally reviewed the radiological images below and agree with the radiology interpretations.  Dg Chest 2 View 05/01/2015  No active cardiopulmonary disease.   Mr Jodene Nam Head Wo Contrast 05/01/2015  Intracranial atherosclerotic type changes most notable involving branch vessels as detailed above. No proximal major vessel significant stenosis or occlusion. Hypoplastic A1 segment left anterior cerebral artery. Prominent ectasia vertebral arteries with mild ectasia and basilar artery.   Mr Brain Wo Contrast 05/01/2015  Small linear infarct medial aspect left occipital lobe may explain the patient's visual disturbance.  Two tiny infarcts left frontal lobe. Remote left lenticular nucleus and right caudate head infarct. Mild chronic small vessel disease type changes. No intracranial hemorrhage.   Carotid Doppler   There is 1-39% bilateral ICA stenosis. Vertebral artery flow is antegrade.    2D Echocardiogram  - Left ventricle: Inferior and posterior lateral hypokinesis Cannotr/o posterior lateral wall aneurysm. Recommend f/u echo withcontrast or cardiac MRI to  furtherevaluate. The cavity size was moderately dilated. Wall thicknesswas normal. Systolic function was moderately reduced. Theestimated ejection fraction was in the range of 35% to 40%.Doppler parameters are consistent with abnormal left ventricularrelaxation (grade 1 diastolic dysfunction). - Left atrium: The atrium was mildly dilated. - Atrial septum: No defect or patent foramen ovale was identified.   PHYSICAL EXAM  Temp:  [97.4 F (36.3 C)-98.5 F (36.9 C)] 97.4 F (36.3 C) (01/06 1458) Pulse Rate:  [80-89] 83 (01/06 1458) Resp:  [9-18] 18 (01/06 1458) BP: (116-160)/(68-94) 148/71 mmHg (01/06 1458) SpO2:  [94 %-98 %] 98 % (01/06 1458)  General - Well nourished, well developed, in no apparent distress.  Ophthalmologic - Fundi not visualized due to eye movement.  Cardiovascular - Regular rate and rhythm with no murmur.  Mental Status -  Level of arousal and orientation to time, place, and person were intact. Language including expression, naming, repetition, comprehension was assessed and found intact. Fund of Knowledge was assessed and was intact.  Cranial Nerves II - XII - II - Visual field intact OU. III, IV, VI - Extraocular movements intact except right CN VI palsy. V - Facial sensation intact bilaterally. VII - Facial movement intact bilaterally. VIII - Hearing & vestibular intact bilaterally. X - Palate elevates symmetrically. XI - Chin turning & shoulder shrug intact bilaterally. XII - Tongue protrusion intact.  Motor Strength - The patient's strength was normal in all extremities and pronator drift was absent.  Bulk was normal and fasciculations were absent.   Motor Tone - Muscle tone was assessed at the neck and appendages and was normal.  Reflexes - The patient's reflexes were 1+ in all extremities and he had no pathological reflexes.  Sensory - Light touch, temperature/pinprick were assessed and were symmetrical.    Coordination - The patient had  normal movements in the hands and feet with no ataxia or dysmetria.  Tremor was absent.  Gait and Station - deferred due to safety concerns.   ASSESSMENT/PLAN Mr. YIFAN BRANCACCIO is a 60 y.o. male with history of HTN, HLD, DM, CAD s/p stent deployment, MI presenting to [facility] with blurred vision, double vision x 2 days. He did not receive IV t-PA due to delay in arrival.   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 DWI-negative pontine stroke felt less likely as no other brainstem deficit  etiology of double vision  Likely will resolve in 1-3 months  Eye patch if needed  DM and HTN control   Stroke: Incidental findings, small linear left occipital and 2 tiny L frontal lobe infarcts, likely embolic secondary to unknown source  Resultant  Double vision not related to infarcts seen on MRI.   MRI  Small left linear occipital infarct, 2 tiny L frontal lobe infarcts.   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   Patient counseled to be compliant with his antithrombotic medications  Ongoing aggressive stroke risk factor management  Therapy recommendations:  pending   Disposition:  Anticipate return home  Follow up at [facility] with Dr. Erlinda Hong in about 2 months.  Cardiomyopathy  EF 35-40%, no prior echo to compare  Concerning for posterior wall aneurysm  Cardiology on board  Agree with TEE and MRI heart  Recommend 30 day cardiac event monitoring to rule out afib  Hypertension  Stable  Permissive  hypertension (OK if < 220/120) but gradually normalize in 5-7 days  Diabetes  HgbA1c pending, goal < 7.0  Uncontrolled  Resumed home meds with glyburide and metformin  Needs close PCP follow up for better glucose control  SSI  Other Stroke Risk Factors  Former Cigarette smoker, quit smoking years ago   Obesity, Body mass index is 31.72 kg/(m^2).   Coronary artery disease - MI w/ stents [facility] day # 1    Rosalin Hawking, MD PhD Stroke Neurology 05/02/2015 5:14 PM      To contact Stroke Continuity provider, please refer to [email or web address] After hours, contact General Neurology

## 2015-05-02 NOTE — Progress Notes (Signed)
*  PRELIMINARY RESULTS* Vascular Ultrasound Carotid Duplex (Doppler) has been completed.   Findings suggest upper range 1-39% internal carotid artery stenosis bilaterally. Vertebral arteries are patent with antegrade flow.  05/02/2015 10:04 AM Maudry Mayhew, RVT, RDCS, RDMS

## 2015-05-02 NOTE — Care Management Note (Signed)
Case Management Note  Patient Details  Name: Grant Moran MRN: [id number] Date of Birth: 1955-07-05  Subjective/Objective:    Patient admitted with CVA. Patient is from home alone.                 Action/Plan: Awaiting PT/OT recommendations. CM will continue to follow for discharge needs.    Expected Discharge Date:                  Expected Discharge Plan:     In-House Referral:     Discharge planning Services     Post Acute Care Choice:    Choice offered to:     DME Arranged:    DME Agency:     HH Arranged:    HH Agency:     Status of Service:  In process, will continue to follow  Medicare Important Message Given:    Date Medicare IM Given:    Medicare IM give by:    Date Additional Medicare IM Given:    Additional Medicare Important Message give by:     If discussed at [place] of Stay Meetings, dates discussed:    Additional Comments:  Pollie Friar, RN 05/02/2015, 10:54 AM

## 2015-05-02 NOTE — Progress Notes (Signed)
  An order for a 30 day monitor to r/o atrial arhythmias has been placed. Our office will call the patient next week to arrange time for pick-up.   Conswella Bruney

## 2015-05-02 NOTE — Consult Note (Signed)
Patient ID: KIRKLIN BARNO MRN: [id number], DOB/AGE: [birth year]   Admit date: 05/01/2015   Primary Physician: Sherrie Mustache, MD Primary Cardiologist: Dr. Angelena Form  Pt. 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 Also with incidental finding of small L occipital and 2 tiny L frontal lobe infarcts on brain MRI, with unknown embolic source. Carotid dopplers with 1-39% ICA stenosis. 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 Cardiology consulted for recommendations regarding TEE vs cardiac MRI to assess for cardiac embolic source/ further investigate questionable aneurysm and to assist with arranging OP cardiac monitoring to assess for atrial fibrillation/flutter.     Problem List  Past Medical History  Diagnosis Date  . Hypertension   . Hyperlipidemia   . Myocardial infarction ([place])     OCT 2001-He under went angioplasty  and  stenting  of the RCA and distal RCA   . Diabetes mellitus ([place])   . CAD (coronary artery disease)     Past Surgical History  Procedure Laterality Date  . Ankle surgery      Trauma  . Stent sx    . Fx left arm       Allergies  No Known Allergies  HPI  60 yo male with history of CAD, HLD, DM and HTN. He was followed in the past by Dr. Percival Spanish but lost to follow-up for 10 years. He re-established cardiac care with Dr. Angelena Form in 2013 but was only seen once and again lost to follow-up. Per records, he has a h/o inferior MI. His RCA had an ulcerated lesion treated with a bare metal stent. He was also noted to have a 70% proximal LAD lesion, 30% OM2, 70% PDA, 70% PLA, 70% distal RCA. He was readmitted 04/21/2000 with chest pain and had severe restenosis within the RCA stent which was treated with angioplasty and brachytherapy. Per office note by  Dr. Angelena Form 11/16/11, the patient admitted to being noncompliant with medications. He was taking his metoprolol but was not taking ASA due to a reported h/o ulcers and refused to take statin medications. Dr. Angelena Form had recommended he undergo a cardiac stress test given his level of disease with no w/u in a 12 year period, however the patient refused as he was asymptomatic. He was instructed to f/u in 1 year but failed to do so.   He presented to [facility] on 04/[facility]14 with a 2 day h/o blurred and double vision. No other neurological deficits. Brain MRI revealed 2 tiny areas of small infarctions in the left frontal lobe as well as an acute small linear infarct in the left occipital lobe. MRA with intracranial atherosclerotic changes of branch vessels posterior and anterior circulation. He was last known well 04/29/15 at 8 am. Patient was not administered TPA secondary to out of the window time frame. He was admitted by internal medicine. Carotid doppler showed 1-39% bilateral ICA stenosis. 2D echo showed reduced LV systolic dysfunction. EF 35-40%. Inferior and posterior lateral hypokinesis was noted with ? Posterior lateral wall aneurysm. Cardiology consulted for recommendations regarding TEE vs cardiac MRI to evaluate for cardiac source of embolus. ? If posterior lateral wall aneurysm is a potential source. 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 Outpatient cardiac monitor also requested.  The patient reports that his vision has improved. He has no other complaints. He denies CP or dyspnea. No palpitations, syncope/ near syncope.     Home Medications  Prior to Admission medications   Medication Sig Start Date End Date Taking? Authorizing Provider  fenofibrate 160 MG tablet Take 160 mg by mouth daily. 04/02/15 04/01/16 Yes Historical Provider, MD  glyBURIDE-metformin (GLUCOVANCE)  5-500 MG tablet Take 1 tablet by mouth 2 (two) times daily.   Yes Historical Provider, MD  INVOKANA 300 MG TABS tablet Take 300 mg by mouth daily. 04/16/15  Yes Historical Provider, MD  metoprolol succinate (TOPROL-XL) 100 MG 24 hr tablet Take 100 mg by mouth daily. Take with or immediately following a meal.   Yes Historical Provider, MD  nitroGLYCERIN (NITROSTAT) 0.4 MG SL tablet Place 0.4 mg under the tongue every 5 (five) minutes as needed for chest pain.     Historical Provider, MD    Family History  Family History  Problem Relation Age of Onset  . Cancer Mother     Colon  . Heart attack Father     CABG  . Heart attack Sister     Social History  Social History   Social History  . Marital Status: Single    Spouse Name: N/A  . Number of Children: 0  . Years of Education: N/A   Occupational History  . Textile plant    Social History Main Topics  . Smoking status: Former Smoker -- 2.00 packs/day for 20 years    Types: Cigarettes    Quit date: 06/18/1990  . Smokeless tobacco: Not on file  . Alcohol Use: No  . Drug Use: No  . Sexual Activity: Not on file   Other Topics Concern  . Not on file   Social History Narrative   Notable for remote tobacco abuse, with a prior three-pack per - day - history  For 20 years . He  Has not smoked since 1992.     Review of Systems General:  No chills, fever, night sweats or weight changes.  Cardiovascular:  No chest pain, dyspnea on exertion, edema, orthopnea, palpitations, paroxysmal nocturnal dyspnea. Dermatological: No rash, lesions/masses Respiratory: No cough, dyspnea Urologic: No hematuria, dysuria Abdominal:   No nausea, vomiting, diarrhea, bright red blood per rectum, melena, or hematemesis Neurologic:  No visual changes, wkns, changes in mental status. All other systems reviewed and are otherwise negative except as noted above.  Physical Exam  Blood pressure 142/70, pulse 80, temperature 97.7 F (36.5 C), temperature  source Oral, resp. rate 16, height 5\' 3"  (1.6 m), weight 179 lb (81.194 kg), SpO2 94 %.  General: Pleasant, NAD Psych: Normal affect. Neuro: Alert and oriented X 3. Moves all extremities spontaneously. HEENT: Normal  Neck: Supple without bruits or JVD. Lungs:  Resp regular and unlabored, CTA. Heart: RRR no s3, s4, or murmurs. Abdomen: Soft, non-tender, non-distended, BS + x 4.  Extremities: No clubbing, cyanosis or edema. DP/PT/Radials 2+ and equal bilaterally.  Labs  Troponin [facility] of Care Test)  Recent Labs  05/01/15 1640  TROPIPOC 0.01    Recent Labs  05/01/15 1547  TROPONINI <0.03   Lab Results  Component Value Date   WBC 6.6 05/01/2015   HGB 14.1 05/01/2015   HCT 39.6 05/01/2015   MCV 87.4 05/01/2015   PLT 258 05/01/2015    Recent Labs Lab 05/01/15 1547  NA 138  K 3.6  CL 103  CO2 21*  BUN 15  CREATININE 0.68  CALCIUM 9.2  PROT 7.4  BILITOT 0.7  ALKPHOS 104  ALT 24  AST 18  GLUCOSE 227*   No results found for: CHOL, HDL, LDLCALC, TRIG No results found for: DDIMER   Radiology/Studies  Dg Chest 2 View  05/01/2015  CLINICAL DATA:  History of hypertension and diabetes. Blurred vision. EXAM: CHEST  2 VIEW COMPARISON:  Patient's prior chest x-ray from December 2001 is not available for comparison FINDINGS: The heart size and mediastinal contours are within normal limits. There is no focal infiltrate, pulmonary edema, or pleural effusion. There is chronic elevation of right hemi The visualized skeletal structures are unremarkable. IMPRESSION: No active cardiopulmonary disease. Electronically Signed   By: Abelardo Diesel M.D.   On: 05/01/2015 18:32   Mr Jodene Nam Head Wo Contrast  05/01/2015  CLINICAL DATA:  60 year old hypertensive diabetic male with visual changes. Subsequent encounter. EXAM: MRA HEAD WITHOUT CONTRAST TECHNIQUE: Angiographic images of the Circle of Willis were obtained using MRA technique without intravenous contrast. COMPARISON:  05/01/2015 brain  MR. FINDINGS: Minimal narrowing of the cavernous segment of the internal carotid artery bilaterally. No significant stenosis of the carotid terminus. No significant stenosis M1 segment or middle cerebral artery bifurcation either side. Middle cerebral artery branch vessel irregularity with regions of mild to moderate narrowing bilaterally. Hypoplastic A1 segment left anterior cerebral artery. Fetal contribution to the left posterior cerebral artery. Ectatic vertebral arteries bilaterally with ectasia more notable on the right. Mild narrowing irregularity of portions of the distal left vertebral artery. Posterior cerebral artery distal branch vessel narrowing and irregularity bilaterally. Mildly ectatic basilar artery without significant stenosis. Nonvisualized anterior inferior cerebellar arteries. Narrowing of the right superior cerebellar artery. Posterior cerebral artery distal branches with moderate narrowing and irregularity bilaterally. No aneurysm noted. IMPRESSION: Intracranial atherosclerotic type changes most notable involving branch vessels as detailed above. No proximal major vessel significant stenosis or occlusion. Hypoplastic A1 segment left anterior cerebral artery. Prominent ectasia vertebral arteries with mild ectasia and basilar artery. Electronically Signed   By: Genia Del M.D.   On: 05/01/2015 21:08   Mr Brain Wo Contrast  05/01/2015  CLINICAL DATA:  60 year old hypertensive diabetic male with diplopia and right-sided headache. No known injury. Initial encounter. EXAM: MRI HEAD WITHOUT CONTRAST TECHNIQUE: Multiplanar, multiecho pulse sequences of the brain and surrounding structures were obtained without intravenous contrast. COMPARISON:  None. FINDINGS: Small linear infarct medial aspect left occipital lobe may explain the patient's visual disturbance. Two tiny infarcts left frontal lobe. Although the left frontal lobe infarcts appear nodular, in the present clinical setting, metastatic  lesions causing restricted motion is felt unlikely. Remote left lenticular nucleus and right caudate head infarct. Mild chronic small vessel disease type changes. No intracranial hemorrhage. No intracranial mass lesion noted on this unenhanced exam. No hydrocephalus. Major intracranial vascular structures are patent. Cervical medullary junction, pituitary region, pineal region and orbital structures unremarkable. Minimal partial opacification right mastoid air cells. IMPRESSION: Small linear infarct medial aspect left occipital lobe may explain the patient's visual disturbance. Two tiny infarcts left frontal lobe. Remote left lenticular nucleus and right caudate head infarct. Mild chronic small vessel disease type changes. No intracranial hemorrhage. Electronically Signed   By: Genia Del M.D.   On: 05/01/2015 15:38    Echocardiogram 05/02/15  Study Conclusions  - Left ventricle: Inferior and posterior lateral hypokinesis Cannot r/o posterior lateral wall aneurysm. Recommend f/u echo with contrast or cardiac MRI to further evaluate. The  cavity size was moderately dilated. Wall thickness was normal. Systolic function was moderately reduced. The estimated ejection fraction was in the range of 35% to 40%. Doppler parameters are consistent with abnormal left ventricular relaxation (grade 1 diastolic dysfunction). - Left atrium: The atrium was mildly dilated. - Atrial septum: No defect or patent foramen ovale was identified.     ASSESSMENT AND PLAN  Active Problems:   CAD (coronary artery disease)   CVA (cerebral infarction)   Diabetes ([place])   Hypertension   1. CVA: ? Cardiac source. 2D echo showed ? Posterior lateral wall aneurysm. It is unlikely that this can result in clot. We will however need to r/o an ASD and PFO, which would be best assessed by TEE. This will also better assess the LA to r/o LAA thrombus. He has been in NSR on telemetry. Agree with  cardiac monitor to  further assess for atrial arrhthymias in the outpatient setting.    2. Reduced LVF: EF 35-40%. Unsure if this is new. No previous documentation of past EF estimates. Given incidental finding of stroke, will treat medically for now. He will likely need f/u stress test vs cath at some point.   3. CAD: 70% proximal LAD lesion, 30% OM2, 70% PDA, 70% PLA, 70% distal RCA noted at time of last cath. Lost to follow-up for many years. He denies any chest pain or dyspnea, however given the severity of his disease and length of time since his last ischemic assessment, he will likely need f/u stress test vs cath at some point.    Signed, Lyda Jester, PA-C 05/02/2015, 2:17 PM   Attending Note:   The patient was seen and examined.  Agree with assessment and plan as noted above.  Changes made to the above note as needed.   1. CVA  - appears to have had a CVA -  He would need a TEE to rule out PCO and ASD. Will get a limited echo with definity today .   I would suggest a cardiac MRI to evaluate this aneurismal area in the posterior base .   Dr. Aundra Dubin would be willing to come in to read the cardiac MRI if we need it.   Will wait and see what the repeat echo images show.   Aneurisms in this area are not particularly prone to forming thrombi unless they are very large ( which this is not )  Would also get a 30 day monitor to look for atrial fib.  2.  Chronic systolic CHF:   EF is now 35-40%.   We do not have an old echo to compare.   He is on Metoprolol.   Will add Losartan 50  Would suggest a Lexiscan myoview as OP  The TEE and MRI can be done as OP if the patient is otherwise ready to go home.    Thayer Headings, Brooke Bonito., MD, [facility] 05/02/2015, 3:51 PM [id number] N. [address]  [place] Pager [phone]

## 2015-05-02 NOTE — Progress Notes (Signed)
Occupational Therapy Evaluation Patient Details Name: Grant Moran MRN: [id number] DOB: 24-Feb-1956 Today's Date: 05/02/2015    History of Present Illness 60 y.o. male admitted with diplopia and blurry vision x2 days. MRI + for Small linear infarct L occipital lobe medial aspect, 2 tiny infarcts L frontal lobe, and remote L lenticular nucleus and R caudate head infarcts. PMH significant for HTN, hyperlipidemia, MI (2001), DM, and CAD.    Clinical Impression   PTA, pt independent with all ADLs and mobility. Pt currently presents with episodes of diplopia and blurry vision, balance impairments, and decreased safety/deficit awareness. Unable to elicit diplopia during session, but pt reports dysconjugate gaze, diplopia with near gaze and nystagmus. Recommend Neuro Outpatient OT for diplopia.     Follow Up Recommendations  Outpatient OT (Neuro)    Equipment Recommendations  None recommended by OT    Recommendations for Other Services       Precautions / Restrictions Precautions Precautions: None Restrictions Weight Bearing Restrictions: No      Mobility Bed Mobility Overal bed mobility: Modified Independent             General bed mobility comments: HOB, no use of bedrails to simulate home environment  Transfers Overall transfer level: Needs assistance Equipment used: None Transfers: Sit to/from Stand Sit to Stand: Supervision         General transfer comment: No physical assist required. Supervision for safety due to impulsivity and decreased safety awareness.    Balance Overall balance assessment: Needs assistance Sitting-balance support: No upper extremity supported;Feet supported Sitting balance-Leahy Scale: Normal     Standing balance support: No upper extremity supported;During functional activity Standing balance-Leahy Scale: Good                              ADL Overall ADL's : Needs assistance/impaired     Grooming: Wash/dry  hands;Wash/dry face;Supervision/safety               Lower Body Dressing: Supervision/safety;Sit to/from stand   Toilet Transfer: Supervision/safety;Ambulation;Regular Toilet   Toileting- Water quality scientist and Hygiene: Supervision/safety;Sit to/from stand       Functional mobility during ADLs: Supervision/safety General ADL Comments: Supervision for ADLs and mobility for safety - pt a bit impulsive. No LOB but pt has no safety awareness.      Vision Vision Assessment?: Yes Eye Alignment: Impaired (comment) Ocular Range of Motion: Restricted on the right Alignment/Gaze Preference: Other (comment) (dysconjugate gaze - R eye) Tracking/Visual Pursuits: Right eye does not track laterally;Decreased smoothness of horizontal tracking;Decreased smoothness of vertical tracking;Unable to hold eye position out of midline (R eye) Saccades: Decreased speed of saccadic movement;Additional head turns occurred during testing;Additional eye shifts occurred during testing Convergence: Impaired (comment) Visual Fields: No apparent deficits Diplopia Assessment: Disappears with one eye closed;Objects split side to side;Present in near gaze Additional Comments: Unable to elicit any double vision during session.    Perception     Praxis      Pertinent Vitals/Pain Pain Assessment: No/denies pain     Hand Dominance Right   Extremity/Trunk Assessment Upper Extremity Assessment Upper Extremity Assessment: Overall WFL for tasks assessed   Lower Extremity Assessment Lower Extremity Assessment: Overall WFL for tasks assessed   Cervical / Trunk Assessment Cervical / Trunk Assessment: Normal   Communication Communication Communication: No difficulties   Cognition Arousal/Alertness: Awake/alert Behavior During Therapy: WFL for tasks assessed/performed Overall Cognitive Status: No family/caregiver present to determine baseline cognitive  functioning                     General  Comments       Exercises       Shoulder Instructions      Home Living Family/patient expects to be discharged to:: Private residence Living Arrangements: Alone Available Help at Discharge: Family;Available PRN/intermittently Type of Home: Mobile home Home Access: Stairs to enter Entrance Stairs-Number of Steps: 1 Entrance Stairs-Rails: Right;Left;Can reach both Home Layout: One level     Bathroom Shower/Tub: Curtain;Walk-in shower   Bathroom Toilet: Standard     Home Equipment: Environmental consultant - 2 wheels;Bedside commode;Grab bars - tub/shower;Shower seat   Additional Comments: Pt's brother, niece and nephew live on the same property.       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    OT Diagnosis: Disturbance of vision   OT Problem List: Impaired balance (sitting and/or standing);Impaired vision/perception;Decreased safety awareness;Decreased knowledge of use of DME or AE   OT Treatment/Interventions: Self-care/ADL training;Therapeutic exercise;DME and/or AE instruction;Visual/perceptual remediation/compensation;Therapeutic activities;Patient/family education;Balance training    OT Goals(Current goals can be found in the care plan section) Acute Rehab OT Goals Patient Stated Goal: to go home OT Goal Formulation: With patient Time For Goal Achievement: 05/16/15 Potential to Achieve Goals: Good ADL Goals Pt/caregiver will Perform Home Exercise Program: Increased strength;Increased ROM;Independently;With written HEP provided (eye exercises) Additional ADL Goal #1: Pt will identify 3 visual compensatory strategies to increase safety with ADLs and mobility.  OT Frequency: Min 2X/week   Barriers to D/C:            Co-evaluation              End of Session Equipment Utilized During Treatment: Gait belt Nurse Communication: Mobility  status  Activity Tolerance: Patient tolerated treatment well Patient left: in chair;with call bell/phone within reach   Time: 1552-1611 OT Time Calculation (min): 19 min Charges:  OT General Charges $OT Visit: 1 Procedure OT Evaluation $OT Eval Low Complexity: 1 Procedure G-Codes:    Redmond Baseman, OTR/L [phone] 05/02/2015, 4:36 PM

## 2015-05-02 NOTE — Progress Notes (Signed)
Echocardiogram 2D Echocardiogram limited with Definity has been performed.  Tresa Res 05/02/2015, 4:58 PM

## 2015-05-02 NOTE — Progress Notes (Signed)
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  Acute embolic CVA:  Non focal exam-vision changes due to above per Neuro  Carotid Doppler with no significant stenosis  Echo shows 35-40% with inf/lat hypokinesis and possible lat wall aneurysm-?source for emboli  Await lipid panel and A1C  Continue ASA 325 and fenofibrate  Await recommendations from cards/neuro-may need a TEE or Cardiac MRI  Monitor on telemetry - plan for outpatient monitor  PT/OT/SLP evaluation  Chronic Systolic Heart Failure:  See above  Clinically compensated  Continue Metoprolol-ACE/ARB next few days   CAD (coronary artery disease) s/p stent placement 2001  Continue ASA, metoprolol; PRN nitrostat    Diabetes Mellitus Type 2:  Sugars slightly elevated, await hgb a1c  Continue Glyburide and insulin sliding scale    Hypertension  BP controlled, continue metoprolol  Code Status: FULL Family Communication:  Disposition Plan: Pending Cardiology recommendations    Consultants:  Neurology  Cardiology  Procedures:  None  Antibiotics:  None    Objective: Filed Vitals:   05/02/15 0558 05/02/15 1020  BP: 118/73 142/70  Pulse: 82 80  Temp: 98.1 F (36.7 C) 97.7 F (36.5 C)  Resp: 16 16    Intake/Output Summary (Last 24 hours) at 05/02/15 1110 Last data filed at 05/02/15 0806  Gross per 24 hour  Intake      0 ml  Output    200 ml  Net   -200 ml   Filed Weights   05/01/15 1420  Weight: 81.194 kg (179 lb)    Exam:   General:  Appears comfortable sitting up in bed, no acute distress, speech clear, A&Ox3  HEENT: EOMI  Cardiovascular: Regular rate and rhythm  Respiratory: non labored  Abdomen:   Musculoskeletal: 5/5 strength bilateral  upper and lower extremities  Data Reviewed: Basic Metabolic Panel:  Recent Labs Lab 05/01/15 1543 05/01/15 1547  NA 136 138  K 3.7 3.6  CL 106 103  CO2  --  21*  GLUCOSE 254* 227*  BUN 15 15  CREATININE 0.60* 0.68  CALCIUM  --  9.2   Liver Function Tests:  Recent Labs Lab 05/01/15 1547  AST 18  ALT 24  ALKPHOS 104  BILITOT 0.7  PROT 7.4  ALBUMIN 4.0   No results for input(s): LIPASE, AMYLASE in the last 168 hours. No results for input(s): AMMONIA in the last 168 hours. CBC:  Recent Labs Lab 05/01/15 1543 05/01/15 1547  WBC  --  6.6  NEUTROABS  --  3.6  HGB 13.3 14.1  HCT 39.0 39.6  MCV  --  87.4  PLT  --  258   Cardiac Enzymes:  Recent Labs Lab 05/01/15 1547  TROPONINI <0.03   BNP (last 3 results) No results for input(s): BNP in the last 8760 hours.  ProBNP (last 3 results) No results for input(s): PROBNP in the last 8760 hours.  CBG:  Recent Labs Lab 05/01/15 2136 05/02/15 0552  GLUCAP 306* 195*    No results found for this or any previous visit (from the past 240 hour(s)).   Studies: Dg Chest 2 View  05/01/2015  CLINICAL DATA:  History of hypertension and diabetes. Blurred vision. EXAM: CHEST  2 VIEW COMPARISON:  Patient's prior chest x-ray from December 2001 is not available for comparison FINDINGS: The heart  size and mediastinal contours are within normal limits. There is no focal infiltrate, pulmonary edema, or pleural effusion. There is chronic elevation of right hemi The visualized skeletal structures are unremarkable. IMPRESSION: No active cardiopulmonary disease. Electronically Signed   By: Abelardo Diesel M.D.   On: 05/01/2015 18:32   Mr Jodene Nam Head Wo Contrast  05/01/2015  CLINICAL DATA:  60 year old hypertensive diabetic male with visual changes. Subsequent encounter. EXAM: MRA HEAD WITHOUT CONTRAST TECHNIQUE: Angiographic images of the Circle of Willis were obtained using MRA technique without intravenous contrast. COMPARISON:   05/01/2015 brain MR. FINDINGS: Minimal narrowing of the cavernous segment of the internal carotid artery bilaterally. No significant stenosis of the carotid terminus. No significant stenosis M1 segment or middle cerebral artery bifurcation either side. Middle cerebral artery branch vessel irregularity with regions of mild to moderate narrowing bilaterally. Hypoplastic A1 segment left anterior cerebral artery. Fetal contribution to the left posterior cerebral artery. Ectatic vertebral arteries bilaterally with ectasia more notable on the right. Mild narrowing irregularity of portions of the distal left vertebral artery. Posterior cerebral artery distal branch vessel narrowing and irregularity bilaterally. Mildly ectatic basilar artery without significant stenosis. Nonvisualized anterior inferior cerebellar arteries. Narrowing of the right superior cerebellar artery. Posterior cerebral artery distal branches with moderate narrowing and irregularity bilaterally. No aneurysm noted. IMPRESSION: Intracranial atherosclerotic type changes most notable involving branch vessels as detailed above. No proximal major vessel significant stenosis or occlusion. Hypoplastic A1 segment left anterior cerebral artery. Prominent ectasia vertebral arteries with mild ectasia and basilar artery. Electronically Signed   By: Genia Del M.D.   On: 05/01/2015 21:08   Mr Brain Wo Contrast  05/01/2015  CLINICAL DATA:  60 year old hypertensive diabetic male with diplopia and right-sided headache. No known injury. Initial encounter. EXAM: MRI HEAD WITHOUT CONTRAST TECHNIQUE: Multiplanar, multiecho pulse sequences of the brain and surrounding structures were obtained without intravenous contrast. COMPARISON:  None. FINDINGS: Small linear infarct medial aspect left occipital lobe may explain the patient's visual disturbance. Two tiny infarcts left frontal lobe. Although the left frontal lobe infarcts appear nodular, in the present clinical  setting, metastatic lesions causing restricted motion is felt unlikely. Remote left lenticular nucleus and right caudate head infarct. Mild chronic small vessel disease type changes. No intracranial hemorrhage. No intracranial mass lesion noted on this unenhanced exam. No hydrocephalus. Major intracranial vascular structures are patent. Cervical medullary junction, pituitary region, pineal region and orbital structures unremarkable. Minimal partial opacification right mastoid air cells. IMPRESSION: Small linear infarct medial aspect left occipital lobe may explain the patient's visual disturbance. Two tiny infarcts left frontal lobe. Remote left lenticular nucleus and right caudate head infarct. Mild chronic small vessel disease type changes. No intracranial hemorrhage. Electronically Signed   By: Genia Del M.D.   On: 05/01/2015 15:38    Scheduled Meds: . aspirin  325 mg Oral Daily  . enoxaparin (LOVENOX) injection  40 mg Subcutaneous Q24H  . fenofibrate  160 mg Oral Daily  . glyBURIDE  5 mg Oral BID WC  . insulin aspart  0-15 Units Subcutaneous TID WC  . insulin aspart  0-5 Units Subcutaneous QHS  . metoprolol succinate  100 mg Oral Daily   Continuous Infusions:     Time spent: 35 minutes    S Ghimire  [facility]  If 7PM-7AM, please contact night-coverage at [email or web address], password [facility] 05/02/2015, 11:10 AM  LOS: 1 day

## 2015-05-03 ENCOUNTER — Other Ambulatory Visit: Payer: Self-pay | Admitting: Cardiology

## 2015-05-03 DIAGNOSIS — H4921 Sixth [abducent] nerve palsy, right eye: Secondary | ICD-10-CM

## 2015-05-03 DIAGNOSIS — I6300 Cerebral infarction due to thrombosis of unspecified precerebral artery: Secondary | ICD-10-CM

## 2015-05-03 DIAGNOSIS — I6349 Cerebral infarction due to embolism of other cerebral artery: Secondary | ICD-10-CM

## 2015-05-03 DIAGNOSIS — I639 Cerebral infarction, unspecified: Secondary | ICD-10-CM

## 2015-05-03 DIAGNOSIS — H4920 Sixth [abducent] nerve palsy, unspecified eye: Secondary | ICD-10-CM

## 2015-05-03 DIAGNOSIS — Z8673 Personal history of transient ischemic attack (TIA), and cerebral infarction without residual deficits: Secondary | ICD-10-CM | POA: Insufficient documentation

## 2015-05-03 DIAGNOSIS — E1141 Type 2 diabetes mellitus with diabetic mononeuropathy: Secondary | ICD-10-CM

## 2015-05-03 DIAGNOSIS — I10 Essential (primary) hypertension: Secondary | ICD-10-CM

## 2015-05-03 LAB — LIPID PANEL
CHOLESTEROL: 187 mg/dL (ref 0–200)
HDL: 23 mg/dL — ABNORMAL LOW (ref >40)
LDL Cholesterol: UNDETERMINED mg/dL (ref 0–99)
TRIGLYCERIDES: 675 mg/dL — AB (ref <150)
Total CHOL/HDL Ratio: 8.1 RATIO
VLDL: UNDETERMINED mg/dL (ref 0–40)

## 2015-05-03 LAB — GLUCOSE, CAPILLARY
Glucose-Capillary: 202 mg/dL — ABNORMAL HIGH (ref 65–99)
Glucose-Capillary: 257 mg/dL — ABNORMAL HIGH (ref 65–99)

## 2015-05-03 LAB — ANTITHROMBIN III: AntiThromb III Func: 88 % (ref 75–120)

## 2015-05-03 MED ORDER — ASPIRIN 325 MG PO TABS: 325.0000 mg | ORAL_TABLET | Freq: Every day | ORAL | Status: DC

## 2015-05-03 MED ORDER — LOSARTAN POTASSIUM 50 MG PO TABS: 50.0000 mg | ORAL_TABLET | Freq: Every day | ORAL | Status: AC

## 2015-05-03 NOTE — Evaluation (Signed)
Physical Therapy Evaluation and Discharge Patient Details Name: Grant Moran MRN: [id number] DOB: [birth year] Today's Date: 05/03/2015   History of Present Illness  60 y.o. male admitted with diplopia and blurry vision x2 days. MRI + for Small linear infarct L occipital lobe medial aspect, 2 tiny infarcts L frontal lobe, and remote L lenticular nucleus and R caudate head infarcts. PMH significant for HTN, hyperlipidemia, MI (2001), DM, and CAD.   Clinical Impression  Patient evaluated by Physical Therapy with no further acute PT needs identified. All education has been completed and the patient has no further questions. At the time of PT eval pt was able to perform transfers and ambulation with modified independence to independence. See below for any follow-up Physical Therapy or equipment needs. PT is signing off. Thank you for this referral.     Follow Up Recommendations No PT follow up    Equipment Recommendations  None recommended by PT    Recommendations for Other Services       Precautions / Restrictions Precautions Precautions: None Restrictions Weight Bearing Restrictions: No      Mobility  Bed Mobility Overal bed mobility: Independent             General bed mobility comments: HOB flat, no use of bedrails to simulate home environment  Transfers Overall transfer level: Modified independent Equipment used: None Transfers: Sit to/from Stand           General transfer comment: No physical assist required. Unsure if pt is impulsive or just moves quickly at baseline. No unsteadiness noted.   Ambulation/Gait Ambulation/Gait assistance: Modified independent (Device/Increase time) Ambulation Distance (Feet): 500 Feet Assistive device: None Gait Pattern/deviations: WFL(Within Functional Limits)   Gait velocity interpretation: at or above normal speed for age/gender General Gait Details: No unsteadiness or LOB noted even with higher level balance  activity  Stairs Stairs: Yes Stairs assistance: Modified independent (Device/Increase time) Stair Management: One rail Right;Forwards;Alternating pattern Number of Stairs: 5 General stair comments: No assist required. Pt has 1 step to enter home.   Wheelchair Mobility    Modified Rankin (Stroke Patients Only)       Balance Overall balance assessment: No apparent balance deficits (not formally assessed) Sitting-balance support: Feet supported;No upper extremity supported Sitting balance-Leahy Scale: Normal     Standing balance support: No upper extremity supported;During functional activity Standing balance-Leahy Scale: Good                               Pertinent Vitals/Pain      Home Living Family/patient expects to be discharged to:: Private residence Living Arrangements: Alone Available Help at Discharge: Family;Available PRN/intermittently Type of Home: Mobile home Home Access: Stairs to enter Entrance Stairs-Rails: Right;Left;Can reach both Entrance Stairs-Number of Steps: 1 Home Layout: One level Home Equipment: Walker - 2 wheels;Bedside commode;Grab bars - tub/shower;Shower seat Additional Comments: Pt's brother, niece and nephew live on the same property.     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     Hand Dominance   Dominant Hand: Right    Extremity/Trunk Assessment   Upper Extremity Assessment: Overall WFL for tasks assessed           Lower Extremity Assessment: Overall WFL for tasks assessed      Cervical / Trunk Assessment:  Normal  Communication   Communication: No difficulties  Cognition Arousal/Alertness: Awake/alert Behavior During Therapy: WFL for tasks assessed/performed Overall Cognitive Status: No family/caregiver present to determine baseline cognitive functioning                       General Comments      Exercises        Assessment/Plan    PT Assessment Patent does not need any further PT services  PT Diagnosis Difficulty walking   PT Problem List    PT Treatment Interventions     PT Goals (Current goals can be found in the Care Plan section) Acute Rehab PT Goals Patient Stated Goal: to go home today PT Goal Formulation: All assessment and education complete, DC therapy    Frequency     Barriers to discharge        Co-evaluation               End of Session Equipment Utilized During Treatment: Gait belt Activity Tolerance: Patient tolerated treatment well Patient left: in chair;with call bell/phone within reach Nurse Communication: Mobility status         Time: 1120-1135 PT Time Calculation (min) (ACUTE ONLY): 15 min   Charges:   PT Evaluation $PT Eval Low Complexity: 1 Procedure     PT G Codes:        Rolinda Roan 05/17/15, 12:56 PM   Rolinda Roan, PT, DPT Acute Rehabilitation Services Pager: [phone]

## 2015-05-03 NOTE — Progress Notes (Signed)
STROKE TEAM PROGRESS NOTE   HISTORY Grant Moran is an 60 y.o. male with a past medical history significant for HTN, HLD, DM, CAD s/p stent deployment, MI, transferred to [facility] for further stroke evaluation-management. He initially presented to [facility] with complains of blurred and double vision x 2 days. Never had similar symptoms before. Patient tells me that last Tuesday he woke up feeling well, ate breakfast, and while driving to work developed sudden onset blurred vision as well as " seeing double every time I moved my head, but with disappearance of double vision by closing an eye". Recalls having forehead pain at that time, but denies associated vertigo, difficulty swallowing, slurred speech, unsteadiness, focal weakness or numbness, confusion, or language impairment. Grant Moran said that he took his blood sugar at home and it was above 400. Overall, he believes that his vision is improving. MRI brain revealed 2 tiny areas of small infarctions left frontal lobe as well as an acute small linear infarct left occipital lobe. MRA with intracranial atherosclerotic changes of branch vessels posterior and anterior circulation. Reviewed available serologies, UA, UDS, and ETOH level are unremarkable. Patient started on aspirin. He was last known well 04/29/15 at 8 am. Patient was not administered TPA secondary to out of the window. He was admitted for further evaluation and treatment.   SUBJECTIVE (INTERVAL HISTORY) Patient reports feeling better.  He states that he rarely has double vision now.  Denies any other complaints  OBJECTIVE Temp:  [97.4 F (36.3 C)-97.8 F (36.6 C)] 97.5 F (36.4 C) (01/07 0622) Pulse Rate:  [74-83] 82 (01/07 0622) Cardiac Rhythm:  [-] Normal sinus rhythm (01/07 0708) Resp:  [16-20] 18 (01/07 0622) BP: (117-148)/(61-83) 117/81 mmHg (01/07 0622) SpO2:  [94 %-98 %] 95 % (01/07 0622)  CBC:   Recent Labs Lab 05/01/15 1543 05/01/15 1547  WBC  --  6.6  NEUTROABS  --  3.6   HGB 13.3 14.1  HCT 39.0 39.6  MCV  --  87.4  PLT  --  0000000    Basic Metabolic Panel:   Recent Labs Lab 05/01/15 1543 05/01/15 1547  NA 136 138  K 3.7 3.6  CL 106 103  CO2  --  21*  GLUCOSE 254* 227*  BUN 15 15  CREATININE 0.60* 0.68  CALCIUM  --  9.2    Lipid Panel: No results found for: CHOL, TRIG, HDL, CHOLHDL, VLDL, LDLCALC HgbA1c: No results found for: HGBA1C Urine Drug Screen:     Component Value Date/Time   LABOPIA NONE DETECTED 05/01/2015 1636   COCAINSCRNUR NONE DETECTED 05/01/2015 1636   LABBENZ NONE DETECTED 05/01/2015 1636   AMPHETMU NONE DETECTED 05/01/2015 1636   THCU NONE DETECTED 05/01/2015 1636   LABBARB NONE DETECTED 05/01/2015 1636      IMAGING I have personally reviewed the radiological images below and agree with the radiology interpretations.  Dg Chest 2 View 05/01/2015  No active cardiopulmonary disease.   [name] Head Wo Contrast 05/01/2015  Intracranial atherosclerotic type changes most notable involving branch vessels as detailed above. No proximal major vessel significant stenosis or occlusion. Hypoplastic A1 segment left anterior cerebral artery. Prominent ectasia vertebral arteries with mild ectasia and basilar artery.    Mr Brain Wo Contrast 05/01/2015   Small linear infarct medial aspect left occipital lobe may explain the patient's visual disturbance. Two tiny infarcts left frontal lobe. Remote left lenticular nucleus and right caudate head infarct. Mild chronic small vessel disease type changes. No intracranial hemorrhage.  Carotid Doppler   There is 1-39% bilateral ICA stenosis. Vertebral artery flow is antegrade.      2D Echocardiogram  - Left ventricle: Inferior and posterior lateral hypokinesis Cannotr/o posterior lateral wall aneurysm. Recommend f/u echo withcontrast or cardiac MRI to furtherevaluate. The cavity size was moderately dilated. Wall thicknesswas normal. Systolic function was moderately reduced. Theestimated  ejection fraction was in the range of 35% to 40%.Doppler parameters are consistent with abnormal left ventricularrelaxation (grade 1 diastolic dysfunction). - Left atrium: The atrium was mildly dilated. - Atrial septum: No defect or patent foramen ovale was identified.   PHYSICAL EXAM  Temp:  [97.4 F (36.3 C)-97.8 F (36.6 C)] 97.5 F (36.4 C) (01/07 0622) Pulse Rate:  [74-83] 82 (01/07 0622) Resp:  [16-20] 18 (01/07 0622) BP: (117-148)/(61-83) 117/81 mmHg (01/07 0622) SpO2:  [94 %-98 %] 95 % (01/07 0622)  General - Well nourished, well developed, in no apparent distress. HEENT:  NCAT; PERRL; nose and throat clear; right 6th nerve palsy Cardiovascular - Regular rate and rhythm with no murmur. Abd:  Benign Extr:  No C/C/E  Mental Status -  Level of arousal and orientation to time, place, and person were intact. Language including expression, naming, repetition, comprehension was assessed and found intact. Fund of Knowledge was assessed and was intact.  Cranial Nerves II - XII - II - Visual field intact OU. III, IV, VI - Extraocular movements intact except right CN VI palsy. V - Facial sensation intact bilaterally. VII - Facial movement intact bilaterally. VIII - Hearing & vestibular intact bilaterally. X - Palate elevates symmetrically. XI - Chin turning & shoulder shrug intact bilaterally. XII - Tongue protrusion intact.  Motor Strength - The patient's strength was normal in all extremities and pronator drift was absent.  Bulk was normal and fasciculations were absent.   Motor Tone - Muscle tone was assessed at the neck and appendages and was normal.  Sensory - Light touch, temperature/pinprick were assessed and were symmetrical.    Coordination - The patient had normal movements in the hands and feet with no ataxia or dysmetria.  Tremor was absent.  Gait and Station - deferred due to safety concerns.   ASSESSMENT/PLAN [name] is a 60 y.o. male with  history of HTN, HLD, DM, CAD s/p stent deployment, MI presenting to [facility] with blurred vision, double vision x 2 days. He did not receive IV t-PA due to delay in arrival.   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 DWI-negative pontine stroke felt less likely as no other brainstem deficit  Etiology of double vision  Likely will resolve in 1-3 months  Eye patch if needed  DM and HTN control   Stroke: Incidental findings, small linear left occipital and 2 tiny L frontal lobe infarcts, likely embolic secondary to unknown source  Resultant  Double vision not related to infarcts seen on MRI.   MRI  Small left linear occipital infarct, 2 tiny L frontal lobe infarcts.   MRA  Intracranial atherosclerosis without significant stenosis/occlusion  Carotid Doppler  unremarkable  2D Echo  EF 35-40%, questionable posterior lateral wall aneurysm  Cardiology consulted and agree with TEE as outpt  Recommend 30 day cardiac event monitoring to rule out afib  LDL pending   HgbA1c pending  Lovenox 40 mg sq daily for VTE prophylaxis Diet Carb Modified Fluid consistency:: Thin; Room service appropriate?: Yes  No antithrombotic prior to admission, now on aspirin 325 mg daily.   Patient counseled to  be compliant with his antithrombotic medications  Ongoing aggressive stroke risk factor management  Therapy recommendations:  Outpatient OT  Disposition:  Anticipate return home  Follow up at [facility] with Dr. Erlinda Hong in about 2 months.  Cardiomyopathy  EF 35-40%, no prior echo to compare  Concerning for posterior wall aneurysm  Cardiology on board  Agree with TEE and MRI heart  Recommend 30 day cardiac event monitoring to rule out afib  Hypertension  Stable  Permissive hypertension (OK if < 220/120) but gradually normalize in 5-7 days  Diabetes  HgbA1c pending, goal < 7.0  Uncontrolled  Resumed home meds with glyburide and metformin  Needs close PCP follow up for  better glucose control  SSI  Other Stroke Risk Factors  Former Cigarette smoker, quit smoking years ago   Obesity, Body mass index is 31.72 kg/(m^2).   Coronary artery disease - MI w/ stents 2001  ATTENDING NOTE: Patient was seen and examined by me personally. Documentation above The laboratory and radiographic studies reviewed by me. ROS completed by me personally and pertinent positives fully documented  Assessment and plan completed by me personally  PLAN  Per Cardiology Consult - TEE and Cardiac MRI can be done outpatient.  Primary team will call to ensure that work up can be done ASAP in OP setting despite issues with inclement weather  Check hypercoagulable panel.  Ordered today  Reordered lipids and hemoglobin A1c - they were not drawn  Antiplatelet therapy for now as work-up thus far has not indicated the need for anticoagulation.  Close follow-up of hypercoagulable studies and Cards w/u to determine if this will change.  ASA 325mg  daily.  Please note that patient has remote (1975) history of ulcers.  Blood Count, stool cards and symptoms should be followed.  SIGNED BY: Dr. Laddie Aquas day # 2         To contact Stroke Continuity provider, please refer to [email or web address] After hours, contact General Neurology

## 2015-05-03 NOTE — Progress Notes (Signed)
Patient ready for discharge to home; discharge instructions given and reviewed; patient refused EMMI [facility] to follow up with him; discharged home accompanied by his family; instructed to call for his f/u appointments.

## 2015-05-03 NOTE — Discharge Instructions (Signed)
Follow with Primary MD Sherrie Mustache, MD in 7 days   Follow A1c, lipid panel and hypercoagulable panel results which are pending checked  by Primary MD next visit.    Activity: As tolerated with Full fall precautions use walker/cane & assistance as needed   Disposition Home     Diet:   Heart Healthy Low carb   For Heart failure patients - Check your Weight same time everyday, if you gain over 2 pounds, or you develop in leg swelling, experience more shortness of breath or chest pain, call your Primary MD immediately. Follow Cardiac Low Salt Diet and 1.5 lit/day fluid restriction.   On your next visit with your primary care physician please Get Medicines reviewed and adjusted.   Please request your Prim.MD to go over all Hospital Tests and Procedure/Radiological results at the follow up, please get all Hospital records sent to your Prim MD by signing hospital release before you go home.   If you experience worsening of your admission symptoms, develop shortness of breath, life threatening emergency, suicidal or homicidal thoughts you must seek medical attention immediately by calling 911 or calling your MD immediately  if symptoms less severe.  You Must read complete instructions/literature along with all the possible adverse reactions/side effects for all the Medicines you take and that have been prescribed to you. Take any new Medicines after you have completely understood and accpet all the possible adverse reactions/side effects.   Do not drive, operating heavy machinery, perform activities at heights, swimming or participation in water activities or provide baby sitting services if your were admitted for syncope or siezures until you have seen by Primary MD or a Neurologist and advised to do so again.  Do not drive when taking Pain medications.    Do not take more than prescribed Pain, Sleep and Anxiety Medications  Special Instructions: If you have smoked or chewed  Tobacco  in the last 2 yrs please stop smoking, stop any regular Alcohol  and or any Recreational drug use.  Wear Seat belts while driving.   Please note  You were cared for by a hospitalist during your hospital stay. If you have any questions about your discharge medications or the care you received while you were in the hospital after you are discharged, you can call the unit and asked to speak with the hospitalist on call if the hospitalist that took care of you is not available. Once you are discharged, your primary care physician will handle any further medical issues. Please note that NO REFILLS for any discharge medications will be authorized once you are discharged, as it is imperative that you return to your primary care physician (or establish a relationship with a primary care physician if you do not have one) for your aftercare needs so that they can reassess your need for medications and monitor your lab values.

## 2015-05-03 NOTE — Discharge Summary (Signed)
Grant Moran, is a 60 y.o. male  DOB 1956-03-21  MRN [id number].  Admission date:  05/01/2015  Admitting Physician  Doree Albee, MD  Discharge Date:  05/03/2015   Primary MD  Sherrie Mustache, MD  Recommendations for primary care physician for things to follow:   Kindly follow on results of hypercoagulable panel, A1c and lipid panel  which are pending  Follow with cardiology for timely TEE, cardiac MRI and 30 day event monitor  [place] with neurology closely Admission Diagnosis  CVA (cerebral infarction) [I63.9] Cerebral infarction due to unspecified mechanism [I63.9]   Discharge Diagnosis  CVA (cerebral infarction) [I63.9] Cerebral infarction due to unspecified mechanism [I63.9]     Active Problems:   CAD (coronary artery disease)   CVA (cerebral infarction)   Diabetes ([place])   Hypertension   6Th nerve palsy   Stroke [facility])      Past Medical History  Diagnosis Date  . Hypertension   . Hyperlipidemia   . Myocardial infarction ([place])     OCT 2001-He under went angioplasty  and  stenting  of the RCA and distal RCA   . Diabetes mellitus ([place])   . CAD (coronary artery disease)     Past Surgical History  Procedure Laterality Date  . Ankle surgery      Trauma  . Stent sx    . Fx left arm         HPI  from the history and physical done on the day of admission:    Grant Moran is a 60 y.o. male  This is a 60 year old man who has a history of coronary artery disease, diabetes who now presents with a 2 day history of episodes of blurred vision and double vision. No headache or nausea or vomiting.Difficulties. He did not have any limb difficulties or gait difficulties either. He also has hyperlipidemia and hypertension. Evaluation in the emergency room shows him to have multiple strokes on the  MRI, in particular the left septal lobe, which may explain his symptoms. He is now being admitted for further investigation and management.      Hospital Course:    1. 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 There was a question whether he had a sixth nerve palsy due to diabetic neuropathy causing his diplopia and that the strokes were incidentally found. He was seen by neurology. Her entry placed on full dose aspirin. His A1c and lipid panel are pending. He underwent echogram which showed wall motion abnormality and depressed EF for which he has to undergo outpatient EEG and cardiac MRI along with 30 day event monitor. He has been seen by cardiology, I discussed his case with both cardiologist Dr. Radford Pax and neurologist on call for the stroke team Dr. Holland Commons, he will be discharged with outpatient cardiac follow-up. This has been arranged by cardiology.    Request PCP to follow on pending results of A1c, lipid panel and hypercoagulable panel. If LDL is over 17 E. to be on a statin,  if he is hypercoagulable then consider anticoagulation instead of aspirin.    2. Chronic systolic heart failure EF of around 40% - 1 stated this admission, placed on beta blocker and ARB. Instructed on fluid and salt restriction. Outpatient cardiology follow-up.  3. CAD with stent placement in 2001. No acute issues, seen by cardiology, continue aspirin, beta blocker for secondary prevention. Monitor lipid panel. Follow with cardiology postdischarge.   4. Essential hypertension. Placed on beta blocker and ARB.   5. DM type II. Continue home regimen, A1c is pending. Request PCP to monitor.     Discharge Condition: Stable  Follow UP  Follow-up Information    Follow up with Sherrie Mustache, MD. Schedule an appointment as soon as possible for a visit in 1 week.   Specialty:  Family Medicine   Contact information:   [place] [address] [phone]       Follow up with  Nahser, Wonda Cheng, MD. Schedule an appointment as soon as possible for a visit in 1 week.   Specialty:  Cardiology   Contact information:   [address] [place] [address] [phone]       Follow up with [facility]. Schedule an appointment as soon as possible for a visit in 1 week.   Contact information:   [address]     [place] [phone]       Consults obtained - Neuro, cards  Diet and Activity recommendation: See Discharge Instructions below  Discharge Instructions         Discharge Instructions    Discharge instructions    Complete by:  As directed   Follow with Primary MD Sherrie Mustache, MD in 7 days   Follow results of hypercoagulable panel, lipid panel and A1c which are pending by Primary MD next visit.    Activity: As tolerated with Full fall precautions use walker/cane & assistance as needed   Disposition Home     Diet:   Heart Healthy Low carb   For Heart failure patients - Check your Weight same time everyday, if you gain over 2 pounds, or you develop in leg swelling, experience more shortness of breath or chest pain, call your Primary MD immediately. Follow Cardiac Low Salt Diet and 1.5 lit/day fluid restriction.   On your next visit with your primary care physician please Get Medicines reviewed and adjusted.   Please request your Prim.MD to go over all Hospital Tests and Procedure/Radiological results at the follow up, please get all Hospital records sent to your Prim MD by signing hospital release before you go home.   If you experience worsening of your admission symptoms, develop shortness of breath, life threatening emergency, suicidal or homicidal thoughts you must seek medical attention immediately by calling 911 or calling your MD immediately  if symptoms less severe.  You Must read complete instructions/literature along with all the possible adverse  reactions/side effects for all the Medicines you take and that have been prescribed to you. Take any new Medicines after you have completely understood and accpet all the possible adverse reactions/side effects.   Do not drive, operating heavy machinery, perform activities at heights, swimming or participation in water activities or provide baby sitting services if your were admitted for syncope or siezures until you have seen by Primary MD or a Neurologist and advised to do so again.  Do not drive when taking Pain medications.    Do not take  more than prescribed Pain, Sleep and Anxiety Medications  Special Instructions: If you have smoked or chewed Tobacco  in the last 2 yrs please stop smoking, stop any regular Alcohol  and or any Recreational drug use.  Wear Seat belts while driving.   Please note  You were cared for by a hospitalist during your hospital stay. If you have any questions about your discharge medications or the care you received while you were in the hospital after you are discharged, you can call the unit and asked to speak with the hospitalist on call if the hospitalist that took care of you is not available. Once you are discharged, your primary care physician will handle any further medical issues. Please note that NO REFILLS for any discharge medications will be authorized once you are discharged, as it is imperative that you return to your primary care physician (or establish a relationship with a primary care physician if you do not have one) for your aftercare needs so that they can reassess your need for medications and monitor your lab values.     Increase activity slowly    Complete by:  As directed              Discharge Medications       Medication List    TAKE these medications        aspirin 325 MG tablet  Take 1 tablet (325 mg total) by mouth daily.     fenofibrate 160 MG tablet  Take 160 mg by mouth daily.     glyBURIDE-metformin 5-500 MG  tablet  Commonly known as:  GLUCOVANCE  Take 1 tablet by mouth 2 (two) times daily.     INVOKANA 300 MG Tabs tablet  Generic drug:  canagliflozin  Take 300 mg by mouth daily.     losartan 50 MG tablet  Commonly known as:  COZAAR  Take 1 tablet (50 mg total) by mouth daily.     metoprolol succinate 100 MG 24 hr tablet  Commonly known as:  TOPROL-XL  Take 100 mg by mouth daily. Take with or immediately following a meal.     nitroGLYCERIN 0.4 MG SL tablet  Commonly known as:  NITROSTAT  Place 0.4 mg under the tongue every 5 (five) minutes as needed for chest pain.        Major procedures and Radiology Reports - PLEASE review detailed and final reports for all details, in brief -    TTE  Study Conclusions  - Impressions: Limited study for wall motion and EF With contrast septal, inferior and posterior lateral hypokinesis EF 40-45% No pericardial effusion RV mildly dilated.  Impressions:  - Limited study for wall motion and EF With contrast septal, inferior and posterior lateral hypokinesis EF 40-45% No pericardial effusion RV mildly dilated.   TTE  - Left ventricle: Inferior and posterior lateral hypokinesis Cannot r/o posterior lateral wall aneurysm. Recommend f/u echo with contrast or cardiac MRI to further evaluate. The cavity size was moderately dilated. Wall thickness was normal. Systolic function was moderately reduced. The estimated ejection fraction was in the range of 35% to 40%. Doppler parameters are consistent with abnormal left ventricular relaxation (grade 1 diastolic dysfunction). - Left atrium: The atrium was mildly dilated. - Atrial septum: No defect or patent foramen ovale was identified.   Dg Chest 2 View  05/01/2015  CLINICAL DATA:  History of hypertension and diabetes. Blurred vision. EXAM: CHEST  2 VIEW COMPARISON:  Patient's prior chest x-ray from  December 2001 is not available for comparison FINDINGS: The  heart size and mediastinal contours are within normal limits. There is no focal infiltrate, pulmonary edema, or pleural effusion. There is chronic elevation of right hemi The visualized skeletal structures are unremarkable. IMPRESSION: No active cardiopulmonary disease. Electronically Signed   By: Abelardo Diesel M.D.   On: 05/01/2015 18:32   Mr Jodene Nam Head Wo Contrast  05/01/2015  CLINICAL DATA:  60 year old hypertensive diabetic male with visual changes. Subsequent encounter. EXAM: MRA HEAD WITHOUT CONTRAST TECHNIQUE: Angiographic images of the Circle of Willis were obtained using MRA technique without intravenous contrast. COMPARISON:  05/01/2015 brain MR. FINDINGS: Minimal narrowing of the cavernous segment of the internal carotid artery bilaterally. No significant stenosis of the carotid terminus. No significant stenosis M1 segment or middle cerebral artery bifurcation either side. Middle cerebral artery branch vessel irregularity with regions of mild to moderate narrowing bilaterally. Hypoplastic A1 segment left anterior cerebral artery. Fetal contribution to the left posterior cerebral artery. Ectatic vertebral arteries bilaterally with ectasia more notable on the right. Mild narrowing irregularity of portions of the distal left vertebral artery. Posterior cerebral artery distal branch vessel narrowing and irregularity bilaterally. Mildly ectatic basilar artery without significant stenosis. Nonvisualized anterior inferior cerebellar arteries. Narrowing of the right superior cerebellar artery. Posterior cerebral artery distal branches with moderate narrowing and irregularity bilaterally. No aneurysm noted. IMPRESSION: Intracranial atherosclerotic type changes most notable involving branch vessels as detailed above. No proximal major vessel significant stenosis or occlusion. Hypoplastic A1 segment left anterior cerebral artery. Prominent ectasia vertebral arteries with mild ectasia and basilar artery.  Electronically Signed   By: Genia Del M.D.   On: 05/01/2015 21:08   Mr Brain Wo Contrast  05/01/2015  CLINICAL DATA:  60 year old hypertensive diabetic male with diplopia and right-sided headache. No known injury. Initial encounter. EXAM: MRI HEAD WITHOUT CONTRAST TECHNIQUE: Multiplanar, multiecho pulse sequences of the brain and surrounding structures were obtained without intravenous contrast. COMPARISON:  None. FINDINGS: Small linear infarct medial aspect left occipital lobe may explain the patient's visual disturbance. Two tiny infarcts left frontal lobe. Although the left frontal lobe infarcts appear nodular, in the present clinical setting, metastatic lesions causing restricted motion is felt unlikely. Remote left lenticular nucleus and right caudate head infarct. Mild chronic small vessel disease type changes. No intracranial hemorrhage. No intracranial mass lesion noted on this unenhanced exam. No hydrocephalus. Major intracranial vascular structures are patent. Cervical medullary junction, pituitary region, pineal region and orbital structures unremarkable. Minimal partial opacification right mastoid air cells. IMPRESSION: Small linear infarct medial aspect left occipital lobe may explain the patient's visual disturbance. Two tiny infarcts left frontal lobe. Remote left lenticular nucleus and right caudate head infarct. Mild chronic small vessel disease type changes. No intracranial hemorrhage. Electronically Signed   By: Genia Del M.D.   On: 05/01/2015 15:38    Micro Results      No results found for this or any previous visit (from the past 240 hour(s)).     Today   Subjective    Infant Schulze today has no headache,no chest abdominal pain,no new weakness tingling or numbness, feels much better wants to go home today.     Objective   Blood pressure 133/63, pulse 87, temperature 98 F (36.7 C), temperature source Oral, resp. rate 18, height 5\' 3"  (1.6 m), weight 81.194 kg (179  lb), SpO2 95 %.  No intake or output data in the 24 hours ending 05/03/15 1215  Exam Awake Alert, Oriented  x 3, No new F.N deficits, Normal affect [place].AT,PERRAL Supple Neck,No JVD, No cervical lymphadenopathy appriciated.  Symmetrical Chest wall movement, Good air movement bilaterally, CTAB RRR,No Gallops,Rubs or new Murmurs, No Parasternal Heave +ve B.Sounds, Abd Soft, Non tender, No organomegaly appriciated, No rebound -guarding or rigidity. No Cyanosis, Clubbing or edema, No new Rash or bruise   Data Review   CBC w Diff:  Lab Results  Component Value Date   WBC 6.6 05/01/2015   HGB 14.1 05/01/2015   HCT 39.6 05/01/2015   PLT 258 05/01/2015   LYMPHOPCT 30 05/01/2015   MONOPCT 12 05/01/2015   EOSPCT 3 05/01/2015   BASOPCT 1 05/01/2015    CMP:  Lab Results  Component Value Date   NA 138 05/01/2015   K 3.6 05/01/2015   CL 103 05/01/2015   CO2 21* 05/01/2015   BUN 15 05/01/2015   CREATININE 0.68 05/01/2015   PROT 7.4 05/01/2015   ALBUMIN 4.0 05/01/2015   BILITOT 0.7 05/01/2015   ALKPHOS 104 05/01/2015   AST 18 05/01/2015   ALT 24 05/01/2015   No results found for: HGBA1C  CBG (last 3)   Recent Labs  05/02/15 2214 05/03/15 0703 05/03/15 1106  GLUCAP 174* 202* 257*   No results found for: CHOL, HDL, LDLCALC, LDLDIRECT, TRIG, CHOLHDL   Total Time in preparing paper work, data evaluation and todays exam - 35 minutes  Lala Lund K M.D on 05/03/2015 at 12:15 PM  [facility]   Office  [phone]

## 2015-05-05 ENCOUNTER — Telehealth: Payer: Self-pay | Admitting: Cardiovascular Disease

## 2015-05-05 ENCOUNTER — Encounter: Payer: Self-pay | Admitting: *Deleted

## 2015-05-05 ENCOUNTER — Other Ambulatory Visit: Payer: Self-pay | Admitting: Cardiovascular Disease

## 2015-05-05 DIAGNOSIS — I6310 Cerebral infarction due to embolism of unspecified precerebral artery: Secondary | ICD-10-CM

## 2015-05-05 LAB — HEMOGLOBIN A1C
Hgb A1c MFr Bld: 12.2 % — ABNORMAL HIGH (ref 4.8–5.6)
Mean Plasma Glucose: 303 mg/dL

## 2015-05-05 LAB — HOMOCYSTEINE: HOMOCYSTEINE-NORM: 9.2 umol/L (ref 0.0–15.0)

## 2015-05-05 NOTE — Telephone Encounter (Signed)
New Message  Pt called in to schedule the TEE. Please call

## 2015-05-05 NOTE — Telephone Encounter (Signed)
Pt states he was recently hospitalized with a stroke. Pt states he was told to call our office to schedule a TEE.

## 2015-05-05 NOTE — Telephone Encounter (Signed)
Dr Acie Fredrickson to review.

## 2015-05-05 NOTE — Telephone Encounter (Signed)
Per Dr Terie Purser TEE then follow up with Dr Acie Fredrickson in 2-3 weeks.   TEE scheduled for 05/07/15 11AM Dr Acie Fredrickson.  I reviewed TEE instructions with pt, pt verbalized understanding.  Pt scheduled to see Dr Acie Fredrickson in the [facility] 05/27/15, pt aware.

## 2015-05-07 ENCOUNTER — Encounter (HOSPITAL_COMMUNITY): Payer: Self-pay

## 2015-05-07 ENCOUNTER — Encounter: Payer: Self-pay | Admitting: Cardiovascular Disease

## 2015-05-07 ENCOUNTER — Ambulatory Visit (HOSPITAL_COMMUNITY)
Admission: RE | Admit: 2015-05-07 | Discharge: 2015-05-07 | Disposition: A | Discharge status: Home / Self Care | Payer: BLUE CROSS/BLUE SHIELD | Source: Ambulatory Visit | Attending: Cardiovascular Disease | Admitting: Cardiovascular Disease

## 2015-05-07 ENCOUNTER — Ambulatory Visit (HOSPITAL_BASED_OUTPATIENT_CLINIC_OR_DEPARTMENT_OTHER): Payer: BLUE CROSS/BLUE SHIELD

## 2015-05-07 ENCOUNTER — Encounter (HOSPITAL_COMMUNITY)
Admission: RE | Disposition: A | Discharge status: Home / Self Care | Payer: Self-pay | Source: Ambulatory Visit | Attending: Cardiovascular Disease

## 2015-05-07 DIAGNOSIS — Z87891 Personal history of nicotine dependence: Secondary | ICD-10-CM | POA: Insufficient documentation

## 2015-05-07 DIAGNOSIS — I252 Old myocardial infarction: Secondary | ICD-10-CM | POA: Diagnosis not present

## 2015-05-07 DIAGNOSIS — I5022 Chronic systolic (congestive) heart failure: Secondary | ICD-10-CM | POA: Diagnosis not present

## 2015-05-07 DIAGNOSIS — I639 Cerebral infarction, unspecified: Secondary | ICD-10-CM | POA: Diagnosis not present

## 2015-05-07 DIAGNOSIS — Z955 Presence of coronary angioplasty implant and graft: Secondary | ICD-10-CM | POA: Insufficient documentation

## 2015-05-07 DIAGNOSIS — I6310 Cerebral infarction due to embolism of unspecified precerebral artery: Secondary | ICD-10-CM

## 2015-05-07 DIAGNOSIS — Z8673 Personal history of transient ischemic attack (TIA), and cerebral infarction without residual deficits: Secondary | ICD-10-CM | POA: Diagnosis not present

## 2015-05-07 DIAGNOSIS — Z9114 Patient's other noncompliance with medication regimen: Secondary | ICD-10-CM | POA: Insufficient documentation

## 2015-05-07 DIAGNOSIS — E785 Hyperlipidemia, unspecified: Secondary | ICD-10-CM | POA: Insufficient documentation

## 2015-05-07 DIAGNOSIS — E1141 Type 2 diabetes mellitus with diabetic mononeuropathy: Secondary | ICD-10-CM | POA: Insufficient documentation

## 2015-05-07 DIAGNOSIS — I110 Hypertensive heart disease with heart failure: Secondary | ICD-10-CM | POA: Insufficient documentation

## 2015-05-07 DIAGNOSIS — Z79899 Other long term (current) drug therapy: Secondary | ICD-10-CM | POA: Diagnosis not present

## 2015-05-07 DIAGNOSIS — I2510 Atherosclerotic heart disease of native coronary artery without angina pectoris: Secondary | ICD-10-CM | POA: Diagnosis not present

## 2015-05-07 DIAGNOSIS — Z7984 Long term (current) use of oral hypoglycemic drugs: Secondary | ICD-10-CM | POA: Insufficient documentation

## 2015-05-07 DIAGNOSIS — H4920 Sixth [abducent] nerve palsy, unspecified eye: Secondary | ICD-10-CM | POA: Insufficient documentation

## 2015-05-07 HISTORY — PX: TEE WITHOUT CARDIOVERSION: SHX5443

## 2015-05-07 LAB — BETA-2-GLYCOPROTEIN I ABS, IGG/M/A: Beta-2 Glyco I IgG: <9 GPI IgG units (ref 0–20)

## 2015-05-07 LAB — PROTEIN C, TOTAL: PROTEIN C, TOTAL: 101 % (ref 60–150)

## 2015-05-07 LAB — LUPUS ANTICOAGULANT PANEL
DRVVT: 42.5 s (ref 0.0–44.0)
PTT Lupus Anticoagulant: 31.9 s (ref 0.0–40.6)

## 2015-05-07 LAB — PROTEIN S ACTIVITY: PROTEIN S ACTIVITY: 121 % (ref 63–140)

## 2015-05-07 LAB — PROTEIN S, TOTAL: Protein S Ag, Total: 218 % — ABNORMAL HIGH (ref 60–150)

## 2015-05-07 LAB — PROTEIN C ACTIVITY: Protein C Activity: 149 % (ref 73–180)

## 2015-05-07 LAB — GLUCOSE, CAPILLARY: Glucose-Capillary: 214 mg/dL — ABNORMAL HIGH (ref 65–99)

## 2015-05-07 SURGERY — ECHOCARDIOGRAM, TRANSESOPHAGEAL
Anesthesia: Moderate Sedation

## 2015-05-07 MED ORDER — PERFLUTREN LIPID MICROSPHERE
INTRAVENOUS | Status: DC | PRN
  Administered 2015-05-07: 2 mL via INTRAVENOUS

## 2015-05-07 MED ORDER — MIDAZOLAM HCL 5 MG/ML IJ SOLN
INTRAMUSCULAR | Status: AC
  Filled 2015-05-07: qty 2

## 2015-05-07 MED ORDER — SODIUM CHLORIDE 0.9 % IV SOLN: INTRAVENOUS | Status: DC

## 2015-05-07 MED ORDER — BUTAMBEN-TETRACAINE-BENZOCAINE 2-2-14 % EX AERO
INHALATION_SPRAY | CUTANEOUS | Status: DC | PRN
  Administered 2015-05-07: 2 via TOPICAL

## 2015-05-07 MED ORDER — MIDAZOLAM HCL 10 MG/2ML IJ SOLN
INTRAMUSCULAR | Status: DC | PRN
  Administered 2015-05-07 (×2): 2 mg via INTRAVENOUS

## 2015-05-07 MED ORDER — FENTANYL CITRATE (PF) 100 MCG/2ML IJ SOLN
INTRAMUSCULAR | Status: DC | PRN
  Administered 2015-05-07: 50 ug via INTRAVENOUS

## 2015-05-07 MED ORDER — PERFLUTREN LIPID MICROSPHERE
INTRAVENOUS | Status: AC
  Filled 2015-05-07: qty 10

## 2015-05-07 MED ORDER — FENTANYL CITRATE (PF) 100 MCG/2ML IJ SOLN
INTRAMUSCULAR | Status: AC
  Filled 2015-05-07: qty 2

## 2015-05-07 NOTE — Discharge Instructions (Signed)
Moderate Conscious Sedation, Adult °Sedation is the use of medicines to promote relaxation and relieve discomfort and anxiety. Moderate conscious sedation is a type of sedation. Under moderate conscious sedation you are less alert than normal but are still able to respond to instructions or stimulation. Moderate conscious sedation is used during short medical and dental procedures. It is milder than deep sedation or general anesthesia and allows you to return to your regular activities sooner. °LET YOUR HEALTH CARE PROVIDER KNOW ABOUT:  °· Any allergies you have. °· All medicines you are taking, including vitamins, herbs, eye drops, creams, and over-the-counter medicines. °· Use of steroids (by mouth or creams). °· Previous problems you or members of your family have had with the use of anesthetics. °· Any blood disorders you have. °· Previous surgeries you have had. °· Medical conditions you have. °· Possibility of pregnancy, if this applies. °· Use of cigarettes, alcohol, or illegal drugs. °RISKS AND COMPLICATIONS °Generally, this is a safe procedure. However, as with any procedure, problems can occur. Possible problems include: °· Oversedation. °· Trouble breathing on your own. You may need to have a breathing tube until you are awake and breathing on your own. °· Allergic reaction to any of the medicines used for the procedure. °BEFORE THE PROCEDURE °· You may have blood tests done. These tests can help show how well your kidneys and liver are working. They can also show how well your blood clots. °· A physical exam will be done.   °· Only take medicines as directed by your health care provider. You may need to stop taking medicines (such as blood thinners, aspirin, or nonsteroidal anti-inflammatory drugs) before the procedure.   °· Do not eat or drink at least 6 hours before the procedure or as directed by your health care provider. °· Arrange for a responsible adult, family member, or friend to take you home  after the procedure. He or she should stay with you for at least 24 hours after the procedure, until the medicine has worn off. °PROCEDURE  °· An intravenous (IV) catheter will be inserted into one of your veins. Medicine will be able to flow directly into your body through this catheter. You may be given medicine through this tube to help prevent pain and help you relax. °· The medical or dental procedure will be done. °AFTER THE PROCEDURE °· You will stay in a recovery area until the medicine has worn off. Your blood pressure and pulse will be checked.   °·  Depending on the procedure you had, you may be allowed to go home when you can tolerate liquids and your pain is under control. °  °This information is not intended to replace advice given to you by your health care provider. Make sure you discuss any questions you have with your health care provider. °  °Document Released: 01/05/2001 Document Revised: 05/03/2014 Document Reviewed: 12/18/2012 °Elsevier Interactive Patient Education ©2016 [employer] °Transesophageal Echocardiogram °Transesophageal echocardiography (TEE) is a special type of test that produces images of the heart by using sound waves (echocardiogram). This type of echocardiography can obtain better images of the heart than standard echocardiography. TEE is done by passing a flexible tube down the esophagus. The heart is located in front of the esophagus. Because the heart and esophagus are close to one another, your health care provider can take very clear, detailed pictures of the heart via ultrasound waves. °TEE may be done: °· If your health care provider needs more information based on standard echocardiography findings. °·   If you had a stroke. This might have happened because a clot formed in your heart. TEE can visualize different areas of the heart and check for clots. °· To check valve anatomy and function. °· To check for infection on the inside of your heart (endocarditis). °· To  evaluate the dividing wall (septum) of the heart and presence of a hole that did not close after birth (patent foramen ovale or atrial septal defect). °· To help diagnose a tear in the wall of the aorta (aortic dissection). °· During cardiac valve surgery. This allows the surgeon to assess the valve repair before closing the chest. °· During a variety of other cardiac procedures to guide positioning of catheters. °· Sometimes before a cardioversion, which is a shock to convert heart rhythm back to normal. °LET YOUR HEALTH CARE PROVIDER KNOW ABOUT:  °· Any allergies you have. °· All medicines you are taking, including vitamins, herbs, eye drops, creams, and over-the-counter medicines. °· Previous problems you or members of your family have had with the use of anesthetics. °· Any blood disorders you have. °· Previous surgeries you have had. °· Medical conditions you have. °· Swallowing difficulties. °· An esophageal obstruction. °RISKS AND COMPLICATIONS  °Generally, TEE is a safe procedure. However, as with any procedure, complications can occur. Possible complications include an esophageal tear (rupture). °BEFORE THE PROCEDURE  °· Do not eat or drink for 6 hours before the procedure or as directed by your health care provider. °· Arrange for someone to drive you home after the procedure. Do not drive yourself home. During the procedure, you will be given medicines that can continue to make you feel drowsy and can impair your reflexes. °· An IV access tube will be started in the arm. °PROCEDURE  °· A medicine to help you relax (sedative) will be given through the IV access tube. °· A medicine may be sprayed or gargled to numb the back of the throat. °· Your blood pressure, heart rate, and breathing (vital signs) will be monitored during the procedure. °· The TEE probe is a long, flexible tube. The tip of the probe is placed into the back of the mouth, and you will be asked to swallow. This helps to pass the tip of the  probe into the esophagus. Once the tip of the probe is in the correct area, your health care provider can take pictures of the heart. °· TEE is usually not a painful procedure. You may feel the probe press against the back of the throat. The probe does not enter the trachea and does not affect your breathing. °AFTER THE PROCEDURE  °· You will be in bed, resting, until you have fully returned to consciousness. °· When you first awaken, your throat may feel slightly sore and will probably still feel numb. This will improve slowly over time. °· You will not be allowed to eat or drink until it is clear that the numbness has improved. °· Once you have been able to drink, urinate, and sit on the edge of the bed without feeling sick to your stomach (nausea) or dizzy, you may be cleared to go home. °· You should have a friend or family member with you for the next 24 hours after your procedure. °  °This information is not intended to replace advice given to you by your health care provider. Make sure you discuss any questions you have with your health care provider. °  °Document Released: 07/03/2002 Document Revised: 04/17/2013 Document Reviewed:   10/12/2012 °Elsevier Interactive Patient Education ©2016 Elsevier Inc. ° °

## 2015-05-07 NOTE — CV Procedure (Signed)
    Transesophageal Echocardiogram Note  Grant Moran [id number] 01/07/1956  Procedure: Transesophageal Echocardiogram Indications: CVA  Procedure Details Consent: Obtained Time Out: Verified patient identification, verified procedure, site/side was marked, verified correct patient position, special equipment/implants available, Radiology Safety Procedures followed,  medications/allergies/relevent history reviewed, required imaging and test results available.  Performed  Medications: Fentanyl: 50 mcg IV  Versed: 4 mg IV   Left Ventrical:  Mild reduction of EF - inferior posterior area of akinesis.  No thrombi   Mitral Valve: normal   Aortic Valve: normal   Tricuspid Valve: normal   Pulmonic Valve: normal  Left Atrium/ Left atrial appendage: small, no thrombi   Atrial septum: no ASD or PFO  Aorta: normal    Complications: No apparent complications Patient did tolerate procedure well.   Thayer Headings, Brooke Bonito., MD, [facility] 05/07/2015, 11:39 AM

## 2015-05-07 NOTE — Progress Notes (Signed)
*  PRELIMINARY RESULTS* Echocardiogram Echocardiogram Transesophageal has been performed.  Leavy Cella 05/07/2015, 12:04 PM

## 2015-05-07 NOTE — Interval H&P Note (Signed)
History and Physical Interval Note:  05/07/2015 9:20 AM  Grant Moran  has presented today for surgery, with the diagnosis of STROKE  The various methods of treatment have been discussed with the patient and family. After consideration of risks, benefits and other options for treatment, the patient has consented to  Procedure(s): TRANSESOPHAGEAL ECHOCARDIOGRAM (TEE) (N/A) as a surgical intervention .  The patient's history has been reviewed, patient examined, no change in status, stable for surgery.  I have reviewed the patient's chart and labs.  Questions were answered to the patient's satisfaction.     Nahser, Wonda Cheng

## 2015-05-07 NOTE — H&P (View-Only) (Signed)
Patient ID: Grant Moran MRN: [id number], DOB/AGE: Mar 08, 1956   Admit date: 05/01/2015   Primary Physician: Sherrie Mustache, MD Primary Cardiologist: Dr. Angelena Form  Pt. 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 Also with incidental finding of small L occipital and 2 tiny L frontal lobe infarcts on brain MRI, with unknown embolic source. Carotid dopplers with 1-39% ICA stenosis. 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 Cardiology consulted for recommendations regarding TEE vs cardiac MRI to assess for cardiac embolic source/ further investigate questionable aneurysm and to assist with arranging OP cardiac monitoring to assess for atrial fibrillation/flutter.     Problem List  Past Medical History  Diagnosis Date  . Hypertension   . Hyperlipidemia   . Myocardial infarction ([place])     OCT 2001-He under went angioplasty  and  stenting  of the RCA and distal RCA   . Diabetes mellitus ([place])   . CAD (coronary artery disease)     Past Surgical History  Procedure Laterality Date  . Ankle surgery      Trauma  . Stent sx    . Fx left arm       Allergies  No Known Allergies  HPI  60 yo male with history of CAD, HLD, DM and HTN. He was followed in the past by Dr. Percival Spanish but lost to follow-up for 10 years. He re-established cardiac care with Dr. Angelena Form in 2013 but was only seen once and again lost to follow-up. Per records, he has a h/o inferior MI. His RCA had an ulcerated lesion treated with a bare metal stent. He was also noted to have a 70% proximal LAD lesion, 30% OM2, 70% PDA, 70% PLA, 70% distal RCA. He was readmitted 04/21/2000 with chest pain and had severe restenosis within the RCA stent which was treated with angioplasty and brachytherapy. Per office note by  Dr. Angelena Form 11/16/11, the patient admitted to being noncompliant with medications. He was taking his metoprolol but was not taking ASA due to a reported h/o ulcers and refused to take statin medications. Dr. Angelena Form had recommended he undergo a cardiac stress test given his level of disease with no w/u in a 12 year period, however the patient refused as he was asymptomatic. He was instructed to f/u in 1 year but failed to do so.   He presented to [facility] on 04/30/14 with a 2 day h/o blurred and double vision. No other neurological deficits. Brain MRI revealed 2 tiny areas of small infarctions in the left frontal lobe as well as an acute small linear infarct in the left occipital lobe. MRA with intracranial atherosclerotic changes of branch vessels posterior and anterior circulation. He was last known well 04/29/15 at 8 am. Patient was not administered TPA secondary to out of the window time frame. He was admitted by internal medicine. Carotid doppler showed 1-39% bilateral ICA stenosis. 2D echo showed reduced LV systolic dysfunction. EF 35-40%. Inferior and posterior lateral hypokinesis was noted with ? Posterior lateral wall aneurysm. Cardiology consulted for recommendations regarding TEE vs cardiac MRI to evaluate for cardiac source of embolus. ? If posterior lateral wall aneurysm is a potential source. 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 Outpatient cardiac monitor also requested.  The patient reports that his vision has improved. He has no other complaints. He denies CP or dyspnea. No palpitations, syncope/ near syncope.     Home Medications  Prior to Admission medications   Medication Sig Start Date End Date Taking? Authorizing Provider  fenofibrate 160 MG tablet Take 160 mg by mouth daily. 04/02/15 04/01/16 Yes Historical Provider, MD  glyBURIDE-metformin (GLUCOVANCE)  5-500 MG tablet Take 1 tablet by mouth 2 (two) times daily.   Yes Historical Provider, MD  INVOKANA 300 MG TABS tablet Take 300 mg by mouth daily. 04/16/15  Yes Historical Provider, MD  metoprolol succinate (TOPROL-XL) 100 MG 24 hr tablet Take 100 mg by mouth daily. Take with or immediately following a meal.   Yes Historical Provider, MD  nitroGLYCERIN (NITROSTAT) 0.4 MG SL tablet Place 0.4 mg under the tongue every 5 (five) minutes as needed for chest pain.     Historical Provider, MD    Family History  Family History  Problem Relation Age of Onset  . Cancer Mother     Colon  . Heart attack Father     CABG  . Heart attack Sister     Social History  Social History   Social History  . Marital Status: Single    Spouse Name: N/A  . Number of Children: 0  . Years of Education: N/A   Occupational History  . Textile plant    Social History Main Topics  . Smoking status: Former Smoker -- 2.00 packs/day for 20 years    Types: Cigarettes    Quit date: 06/18/1990  . Smokeless tobacco: Not on file  . Alcohol Use: No  . Drug Use: No  . Sexual Activity: Not on file   Other Topics Concern  . Not on file   Social History Narrative   Notable for remote tobacco abuse, with a prior three-pack per - day - history  For 20 years . He  Has not smoked since 1992.     Review of Systems General:  No chills, fever, night sweats or weight changes.  Cardiovascular:  No chest pain, dyspnea on exertion, edema, orthopnea, palpitations, paroxysmal nocturnal dyspnea. Dermatological: No rash, lesions/masses Respiratory: No cough, dyspnea Urologic: No hematuria, dysuria Abdominal:   No nausea, vomiting, diarrhea, bright red blood per rectum, melena, or hematemesis Neurologic:  No visual changes, wkns, changes in mental status. All other systems reviewed and are otherwise negative except as noted above.  Physical Exam  Blood pressure 142/70, pulse 80, temperature 97.7 F (36.5 C), temperature  source Oral, resp. rate 16, height 5\' 3"  (1.6 m), weight 179 lb (81.194 kg), SpO2 94 %.  General: Pleasant, NAD Psych: Normal affect. Neuro: Alert and oriented X 3. Moves all extremities spontaneously. HEENT: Normal  Neck: Supple without bruits or JVD. Lungs:  Resp regular and unlabored, CTA. Heart: RRR no s3, s4, or murmurs. Abdomen: Soft, non-tender, non-distended, BS + x 4.  Extremities: No clubbing, cyanosis or edema. DP/PT/Radials 2+ and equal bilaterally.  Labs  Troponin [facility] Test)  Recent Labs  05/01/15 1640  TROPIPOC 0.01    Recent Labs  05/01/15 1547  TROPONINI <0.03   Lab Results  Component Value Date   WBC 6.6 05/01/2015   HGB 14.1 05/01/2015   HCT 39.6 05/01/2015   MCV 87.4 05/01/2015   PLT 258 05/01/2015    Recent Labs Lab 05/01/15 1547  NA 138  K 3.6  CL 103  CO2 21*  BUN 15  CREATININE 0.68  CALCIUM 9.2  PROT 7.4  BILITOT 0.7  ALKPHOS 104  ALT 24  AST 18  GLUCOSE 227*   No results found for: CHOL, HDL, LDLCALC, TRIG No results found for: DDIMER   Radiology/Studies  Dg Chest 2 View  05/01/2015  CLINICAL DATA:  History of hypertension and diabetes. Blurred vision. EXAM: CHEST  2 VIEW COMPARISON:  Patient's prior chest x-ray from December 2001 is not available for comparison FINDINGS: The heart size and mediastinal contours are within normal limits. There is no focal infiltrate, pulmonary edema, or pleural effusion. There is chronic elevation of right hemi The visualized skeletal structures are unremarkable. IMPRESSION: No active cardiopulmonary disease. Electronically Signed   By: Abelardo Diesel M.D.   On: 05/01/2015 18:32   Mr Jodene Nam Head Wo Contrast  05/01/2015  CLINICAL DATA:  60 year old hypertensive diabetic male with visual changes. Subsequent encounter. EXAM: MRA HEAD WITHOUT CONTRAST TECHNIQUE: Angiographic images of the Circle of Willis were obtained using MRA technique without intravenous contrast. COMPARISON:  05/01/2015 brain  MR. FINDINGS: Minimal narrowing of the cavernous segment of the internal carotid artery bilaterally. No significant stenosis of the carotid terminus. No significant stenosis M1 segment or middle cerebral artery bifurcation either side. Middle cerebral artery branch vessel irregularity with regions of mild to moderate narrowing bilaterally. Hypoplastic A1 segment left anterior cerebral artery. Fetal contribution to the left posterior cerebral artery. Ectatic vertebral arteries bilaterally with ectasia more notable on the right. Mild narrowing irregularity of portions of the distal left vertebral artery. Posterior cerebral artery distal branch vessel narrowing and irregularity bilaterally. Mildly ectatic basilar artery without significant stenosis. Nonvisualized anterior inferior cerebellar arteries. Narrowing of the right superior cerebellar artery. Posterior cerebral artery distal branches with moderate narrowing and irregularity bilaterally. No aneurysm noted. IMPRESSION: Intracranial atherosclerotic type changes most notable involving branch vessels as detailed above. No proximal major vessel significant stenosis or occlusion. Hypoplastic A1 segment left anterior cerebral artery. Prominent ectasia vertebral arteries with mild ectasia and basilar artery. Electronically Signed   By: Genia Del M.D.   On: 05/01/2015 21:08   Mr Brain Wo Contrast  05/01/2015  CLINICAL DATA:  60 year old hypertensive diabetic male with diplopia and right-sided headache. No known injury. Initial encounter. EXAM: MRI HEAD WITHOUT CONTRAST TECHNIQUE: Multiplanar, multiecho pulse sequences of the brain and surrounding structures were obtained without intravenous contrast. COMPARISON:  None. FINDINGS: Small linear infarct medial aspect left occipital lobe may explain the patient's visual disturbance. Two tiny infarcts left frontal lobe. Although the left frontal lobe infarcts appear nodular, in the present clinical setting, metastatic  lesions causing restricted motion is felt unlikely. Remote left lenticular nucleus and right caudate head infarct. Mild chronic small vessel disease type changes. No intracranial hemorrhage. No intracranial mass lesion noted on this unenhanced exam. No hydrocephalus. Major intracranial vascular structures are patent. Cervical medullary junction, pituitary region, pineal region and orbital structures unremarkable. Minimal partial opacification right mastoid air cells. IMPRESSION: Small linear infarct medial aspect left occipital lobe may explain the patient's visual disturbance. Two tiny infarcts left frontal lobe. Remote left lenticular nucleus and right caudate head infarct. Mild chronic small vessel disease type changes. No intracranial hemorrhage. Electronically Signed   By: Genia Del M.D.   On: 05/01/2015 15:38    Echocardiogram 05/02/15  Study Conclusions  - Left ventricle: Inferior and posterior lateral hypokinesis Cannot r/o posterior lateral wall aneurysm. Recommend f/u echo with contrast or cardiac MRI to further evaluate. The  cavity size was moderately dilated. Wall thickness was normal. Systolic function was moderately reduced. The estimated ejection fraction was in the range of 35% to 40%. Doppler parameters are consistent with abnormal left ventricular relaxation (grade 1 diastolic dysfunction). - Left atrium: The atrium was mildly dilated. - Atrial septum: No defect or patent foramen ovale was identified.     ASSESSMENT AND PLAN  Active Problems:   CAD (coronary artery disease)   CVA (cerebral infarction)   Diabetes ([place])   Hypertension   1. CVA: ? Cardiac source. 2D echo showed ? Posterior lateral wall aneurysm. It is unlikely that this can result in clot. We will however need to r/o an ASD and PFO, which would be best assessed by TEE. This will also better assess the LA to r/o LAA thrombus. He has been in NSR on telemetry. Agree with  cardiac monitor to  further assess for atrial arrhthymias in the outpatient setting.    2. Reduced LVF: EF 35-40%. Unsure if this is new. No previous documentation of past EF estimates. Given incidental finding of stroke, will treat medically for now. He will likely need f/u stress test vs cath at some point.   3. CAD: 70% proximal LAD lesion, 30% OM2, 70% PDA, 70% PLA, 70% distal RCA noted at time of last cath. Lost to follow-up for many years. He denies any chest pain or dyspnea, however given the severity of his disease and length of time since his last ischemic assessment, he will likely need f/u stress test vs cath at some point.    Signed, Lyda Jester, PA-C 05/02/2015, 2:17 PM   Attending Note:   The patient was seen and examined.  Agree with assessment and plan as noted above.  Changes made to the above note as needed.   1. CVA  - appears to have had a CVA -  He would need a TEE to rule out PCO and ASD. Will get a limited echo with definity today .   I would suggest a cardiac MRI to evaluate this aneurismal area in the posterior base .   Dr. Aundra Dubin would be willing to come in to read the cardiac MRI if we need it.   Will wait and see what the repeat echo images show.   Aneurisms in this area are not particularly prone to forming thrombi unless they are very large ( which this is not )  Would also get a 30 day monitor to look for atrial fib.  2.  Chronic systolic CHF:   EF is now 35-40%.   We do not have an old echo to compare.   He is on Metoprolol.   Will add Losartan 50  Would suggest a Lexiscan myoview as OP  The TEE and MRI can be done as OP if the patient is otherwise ready to go home.    Thayer Headings, Brooke Bonito., MD, [facility] 05/02/2015, 3:51 PM [id number] [address]. [address],  [place] Pager [phone]

## 2015-05-08 ENCOUNTER — Encounter (HOSPITAL_COMMUNITY): Payer: Self-pay | Admitting: Cardiovascular Disease

## 2015-05-08 LAB — PROTHROMBIN GENE MUTATION

## 2015-05-08 LAB — CARDIOLIPIN ANTIBODIES, IGG, IGM, IGA: Anticardiolipin IgM: <9 MPL U/mL (ref 0–12)

## 2015-05-09 LAB — FACTOR 5 LEIDEN

## 2015-05-13 ENCOUNTER — Other Ambulatory Visit: Payer: Self-pay | Admitting: Cardiology

## 2015-05-13 DIAGNOSIS — I4891 Unspecified atrial fibrillation: Secondary | ICD-10-CM

## 2015-05-13 DIAGNOSIS — I639 Cerebral infarction, unspecified: Secondary | ICD-10-CM

## 2015-05-14 ENCOUNTER — Ambulatory Visit (INDEPENDENT_AMBULATORY_CARE_PROVIDER_SITE_OTHER): Payer: BLUE CROSS/BLUE SHIELD

## 2015-05-14 DIAGNOSIS — I639 Cerebral infarction, unspecified: Secondary | ICD-10-CM | POA: Diagnosis not present

## 2015-05-14 DIAGNOSIS — I4891 Unspecified atrial fibrillation: Secondary | ICD-10-CM | POA: Diagnosis not present

## 2015-05-21 ENCOUNTER — Other Ambulatory Visit (HOSPITAL_COMMUNITY): Payer: BLUE CROSS/BLUE SHIELD

## 2015-05-27 ENCOUNTER — Ambulatory Visit (INDEPENDENT_AMBULATORY_CARE_PROVIDER_SITE_OTHER): Payer: BLUE CROSS/BLUE SHIELD | Admitting: Cardiovascular Disease

## 2015-05-27 ENCOUNTER — Encounter: Payer: Self-pay | Admitting: Cardiovascular Disease

## 2015-05-27 VITALS — BP 128/80 | HR 68 | Ht 63.0 in | Wt 176.0 lb

## 2015-05-27 DIAGNOSIS — I2583 Coronary atherosclerosis due to lipid rich plaque: Principal | ICD-10-CM

## 2015-05-27 DIAGNOSIS — I2510 Atherosclerotic heart disease of native coronary artery without angina pectoris: Secondary | ICD-10-CM | POA: Diagnosis not present

## 2015-05-27 DIAGNOSIS — I639 Cerebral infarction, unspecified: Secondary | ICD-10-CM

## 2015-05-27 DIAGNOSIS — I10 Essential (primary) hypertension: Secondary | ICD-10-CM

## 2015-05-27 NOTE — Progress Notes (Signed)
Grant Moran:[id number] 1956/01/13     Problem list 1. Coronary artery disease-status post stenting 2. Hypertension 3. Hyperlipidemia 4. 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 History of stroke    History of Present Illness: 60 y.o.  yo male with history of CAD, HLD, HTN who is here today  For follow up for a recent hospitalization for CVA. He had an echocardiogram that revealed inferior basilar akinesis. There was no evidence of thrombus by Definity contrast. Had a transesophageal that confirmed mild to moderate LV dysfunction. Was no ASD or PFO seen.   Marland Kitchen He was seen by Dr. Percival Spanish 10 years ago adn by Dr. Angelena Form 4 years ago .  His cardiac history dates back to 2001 when he presented with an inferior MI. His RCA had an ulcerated lesion treated with a bare metal stent. He was also noted to have a 70% proximal LAD lesion, 30% OM2, 70% PDA, 70% PLA, 70% distal RCA. He was readmitted 04/21/2000 with chest pain and had severe restenosis RCA stent treated with angioplasty and brachytherapy. Lost to cardiology follow up since then with no workup. He has not been taking ASA due to ulcers and off of Plavix, statin and Ace-inh.   He has not seen neurology for follow up of his CVA .- he forgot to call . No CP or dyspnea Is wearing a 30 day event monitor -    Primary Care Physician: [place]   Past Medical History  Diagnosis Date  . Hypertension   . Hyperlipidemia   . Myocardial infarction ([place])     OCT 2001-He under went angioplasty  and  stenting  of the RCA and distal RCA   . Diabetes mellitus ([place])   . CAD (coronary artery disease)     Past Surgical History  Procedure Laterality Date  . Ankle surgery      Trauma  . Stent sx    . Fx left arm    . Tee without cardioversion N/A 05/07/2015    Procedure: TRANSESOPHAGEAL ECHOCARDIOGRAM (TEE);  Surgeon: Thayer Headings, MD;  Location: [facility] ENDOSCOPY;  Service: Cardiovascular;  Laterality: N/A;    Current  Outpatient Prescriptions on File Prior to Visit  Medication Sig Dispense Refill  . glyBURIDE-metformin (GLUCOVANCE) 5-500 MG per tablet Take 1 tablet by mouth 2 (two) times daily with a meal.      . metoprolol succinate (TOPROL-XL) 100 MG 24 hr tablet Take 100 mg by mouth daily. Take with or immediately following a meal.      . nitroGLYCERIN (NITROSTAT) 0.4 MG SL tablet Place 0.4 mg under the tongue every 5 (five) minutes as needed.                                       No Known Allergies  Social History   Social History  . Marital Status: Single    Spouse Name: N/A  . Number of Children: 0  . Years of Education: N/A   Occupational History  . Textile plant    Social History Main Topics  . Smoking status: Former Smoker -- 2.00 packs/day for 20 years    Types: Cigarettes    Quit date: 06/18/1990  . Smokeless tobacco: Not on file  . Alcohol Use: No  . Drug Use: No  . Sexual Activity: Not on file   Other Topics Concern  . Not on file  Social History Narrative   Notable for remote tobacco abuse, with a prior three-pack per - day - history  For 20 years . He  Has not smoked since 1992.    Family History  Problem Relation Age of Onset  . Cancer Mother     Colon  . Heart attack Father     CABG  . Heart attack Sister     Review of Systems:  As stated in the HPI and otherwise negative.   BP 128/80 mmHg  Pulse 68  Ht 5\' 3"  (1.6 m)  Wt 176 lb (79.833 kg)  BMI 31.18 kg/m2  Physical Examination: General: Well developed, well nourished, NAD HEENT: OP clear, mucus membranes moist SKIN: warm, dry. No rashes. Neuro: No focal deficits Musculoskeletal: Muscle strength 5/5 all ext Psychiatric: Mood and affect normal Neck: No JVD, no carotid bruits, no thyromegaly, no lymphadenopathy. Lungs: Clear bilaterally, no wheezes, rhonci, crackles Cardiovascular: Regular rate and rhythm. No murmurs, gallops or rubs. Abdomen:Soft. Bowel sounds present. Non-tender.  Extremities: No  lower extremity edema. Pulses are 2 + in the bilateral DP/PT.   EKG:     Assessment and Plan   1. Coronary artery disease-status post stenting - no  angina  2. Hypertension - BP is well controlled 3. Hyperlipidemia -  4. 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 No dyspnea. Active,  Continue current meds   5. History of stroke - doing ok Vision is still not as good as he would like,  Wants to go back to work .   He has no limitations from my standpoint.    Has an appt to see Dr. Edrick Oh today    I'll see him in 6 months for follow-up visit.   Alica Shellhammer, Wonda Cheng, MD  05/27/2015 6:04 PM    [address],  [place], [place]  [address] Pager [phone] Phone: [phone]; Fax: [phone]

## 2015-05-27 NOTE — Patient Instructions (Signed)
Medication Instructions:  Your physician recommends that you continue on your current medications as directed. Please refer to the Current Medication list given to you today.   Labwork: None Ordered   Testing/Procedures: None Ordered   Follow-Up: Your physician wants you to follow-up in: 6 months with Dr. Nahser.  You will receive a reminder letter in the mail two months in advance. If you don't receive a letter, please call our office to schedule the follow-up appointment.   If you need a refill on your cardiac medications before your next appointment, please call your pharmacy.   Thank you for choosing [facility]! Denaja Verhoeven, RN [phone]    

## 2015-06-03 ENCOUNTER — Encounter (INDEPENDENT_AMBULATORY_CARE_PROVIDER_SITE_OTHER): Payer: Self-pay

## 2015-06-03 ENCOUNTER — Ambulatory Visit (INDEPENDENT_AMBULATORY_CARE_PROVIDER_SITE_OTHER): Payer: BLUE CROSS/BLUE SHIELD | Admitting: Neurology

## 2015-06-03 ENCOUNTER — Telehealth: Payer: Self-pay | Admitting: *Deleted

## 2015-06-03 ENCOUNTER — Encounter: Payer: Self-pay | Admitting: Neurology

## 2015-06-03 VITALS — BP 130/88 | HR 82 | Ht 63.0 in | Wt 176.8 lb

## 2015-06-03 DIAGNOSIS — I6340 Cerebral infarction due to embolism of unspecified cerebral artery: Secondary | ICD-10-CM | POA: Diagnosis not present

## 2015-06-03 DIAGNOSIS — H4921 Sixth [abducent] nerve palsy, right eye: Secondary | ICD-10-CM | POA: Diagnosis not present

## 2015-06-03 DIAGNOSIS — E785 Hyperlipidemia, unspecified: Secondary | ICD-10-CM | POA: Insufficient documentation

## 2015-06-03 DIAGNOSIS — E1141 Type 2 diabetes mellitus with diabetic mononeuropathy: Secondary | ICD-10-CM | POA: Insufficient documentation

## 2015-06-03 DIAGNOSIS — I10 Essential (primary) hypertension: Secondary | ICD-10-CM

## 2015-06-03 DIAGNOSIS — E1159 Type 2 diabetes mellitus with other circulatory complications: Secondary | ICD-10-CM | POA: Diagnosis not present

## 2015-06-03 DIAGNOSIS — H4920 Sixth [abducent] nerve palsy, unspecified eye: Secondary | ICD-10-CM | POA: Insufficient documentation

## 2015-06-03 MED ORDER — CLOPIDOGREL BISULFATE 75 MG PO TABS: 75.0000 mg | ORAL_TABLET | Freq: Every day | ORAL | Status: AC

## 2015-06-03 MED ORDER — ATORVASTATIN CALCIUM 20 MG PO TABS: 20.0000 mg | ORAL_TABLET | Freq: Every morning | ORAL | Status: DC

## 2015-06-03 NOTE — Progress Notes (Signed)
STROKE NEUROLOGY FOLLOW UP NOTE  NAME: Grant Moran DOB: 11/12/1955  REASON FOR VISIT: stroke follow up HISTORY FROM: pt and chart  Today we had the pleasure of seeing Grant Moran in follow-up at our [facility]rology [facility]. Pt was accompanied by no one.   History Summary Grant Moran is a 60 y.o. male with history of HTN, HLD, DM, MI and CAD s/p stent admitted on 05/01/15 for blurred vision, double vision for 2 days. Denies any other brainstem deficit. Exam showed right CN VI palsy, DWI negative for pontine infarct, consistent with diabetic CN VI mononeuropathy. However, MRI did show small linear left occipital and 2 tiny L frontal lobe infarcts, likely embolic. MRA showed intracranial athero but no significant stenosis or occlusion. CUS unremarkable, but TTE EF 35-40% with questionable posterior lateral wall aneurysm. Cardiology consulted and recommended outpt TEE. LDL not able to calculate due to high TG, and A1C 12.2. His hypercoagulable work up negative (ordered by Dr. Belenda Cruise). He was discharged with ASA and arranged for outpt TEE and 30 day monitoring.   Interval History During the interval time, the patient has been doing well. Double vision much improved but still has mild right CN VI palsy. Had TEE which showed EF 40-45% and no aneurysm or clot seen. 30 day monitoring on going. Checked glucose at home, better controlled. BP [id number], but stated at home usually lower than that. He has stomach issue so that he did not take ASA daily. He is requesting the time for back to work.     REVIEW OF SYSTEMS: Full 14 system review of systems performed and notable only for those listed below and in HPI above, all others are negative:  Constitutional:   Cardiovascular:  Ear/Nose/Throat:   Skin:  Eyes:  Double vision, blurry vision Respiratory:   Gastroitestinal:   Genitourinary:  Hematology/Lymphatic:   Endocrine:  Musculoskeletal:   Allergy/Immunology:   Neurological:   Psychiatric:    Sleep:   The following represents the patient's updated allergies and side effects list: No Known Allergies  The neurologically relevant items on the patient's problem list were reviewed on today's visit.  Neurologic Examination  A problem focused neurological exam (12 or more points of the single system neurologic examination, vital signs counts as 1 point, cranial nerves count for 8 points) was performed.  Blood pressure 130/88, pulse 82, height 5\' 3"  (1.6 m), weight 176 lb 12.8 oz (80.196 kg).  General - Well nourished, well developed, in no apparent distress.  Ophthalmologic - Fundi not visualized due to eye movement.  Cardiovascular - Regular rate and rhythm.  Mental Status -  Level of arousal and orientation to time, place, and person were intact. Language including expression, naming, repetition, comprehension was assessed and found intact. Fund of Knowledge was assessed and was intact.  Cranial Nerves II - XII - II - Visual field intact OU. III, IV, VI - Extraocular movements showed right eye lateral gaze partially incomplete, but did not complain of diplopia. V - Facial sensation intact bilaterally. VII - Facial movement intact bilaterally. VIII - Hearing & vestibular intact bilaterally. X - Palate elevates symmetrically. XI - Chin turning & shoulder shrug intact bilaterally. XII - Tongue protrusion intact.  Motor Strength - The patient's strength was normal in all extremities and pronator drift was absent.  Bulk was normal and fasciculations were absent.   Motor Tone - Muscle tone was assessed at the neck and appendages and was normal.  Reflexes - The  patient's reflexes were 1+ in all extremities and he had no pathological reflexes.  Sensory - Light touch, temperature/pinprick, vibration and proprioception, and Romberg testing were assessed and were normal    Coordination - The patient had normal movements in the hands and feet with no ataxia or dysmetria.  Tremor  was absent.  Gait and Station - The patient's transfers, posture, gait, station, and turns were observed as normal.  Data reviewed: I personally reviewed the images and agree with the radiology interpretations.  Mr Jodene Nam Head Wo Contrast 05/01/2015 Intracranial atherosclerotic type changes most notable involving branch vessels as detailed above. No proximal major vessel significant stenosis or occlusion. Hypoplastic A1 segment left anterior cerebral artery. Prominent ectasia vertebral arteries with mild ectasia and basilar artery.   Mr Brain Wo Contrast 05/01/2015  Small linear infarct medial aspect left occipital lobe may explain the patient's visual disturbance. Two tiny infarcts left frontal lobe. Remote left lenticular nucleus and right caudate head infarct. Mild chronic small vessel disease type changes. No intracranial hemorrhage.   Carotid Doppler  There is 1-39% bilateral ICA stenosis. Vertebral artery flow is antegrade.  2D Echocardiogram  - Left ventricle: Inferior and posterior lateral hypokinesis Cannotr/o posterior lateral wall aneurysm. Recommend f/u echo withcontrast or cardiac MRI to furtherevaluate. The cavity size was moderately dilated. Wall thicknesswas normal. Systolic function was moderately reduced. Theestimated ejection fraction was in the range of 35% to 40%.Doppler parameters are consistent with abnormal left ventricularrelaxation (grade 1 diastolic dysfunction). - Left atrium: The atrium was mildly dilated. - Atrial septum: No defect or patent foramen ovale was identified.  TEE - Left ventricle: Systolic function was mildly to moderately reduced. The estimated ejection fraction was in the range of 40% to 45%. Akinesis of the basalinferolateral myocardium. - Aortic valve: No evidence of vegetation. No evidence of vegetation. - Mitral valve: No evidence of vegetation. - Left atrium: No evidence of thrombus in the atrial cavity or appendage. -  Atrial septum: No defect or patent foramen ovale was identified. - Tricuspid valve: No evidence of vegetation.  30 day cardiac monitoring - pending  Component     Latest Ref Rng 05/03/2015  Cholesterol     0 - 200 mg/dL 187  Triglycerides     <150 mg/dL 675 (H)  HDL Cholesterol     >40 mg/dL 23 (L)  Total CHOL/HDL Ratio      8.1  VLDL     0 - 40 mg/dL UNABLE TO CALCULATE IF TRIGLYCERIDE OVER 400 mg/dL  LDL (calc)     0 - 99 mg/dL UNABLE TO CALCULATE IF TRIGLYCERIDE OVER 400 mg/dL  PTT Lupus Anticoagulant     0.0 - 40.6 sec 31.9  DRVVT     0.0 - 44.0 sec 42.5  Lupus Anticoag Interp      Comment:  Beta-2 Glycoprotein I Ab, IgG     0 - 20 GPI IgG units <9  Beta-2-Glycoprotein I IgM     0 - 32 GPI IgM units <9  Beta-2-Glycoprotein I IgA     0 - 25 GPI IgA units <9  Anticardiolipin Ab,IgG,Qn     0 - 14 GPL U/mL <9  Anticardiolipin Ab,IgM,Qn     0 - 12 MPL U/mL <9  Anticardiolipin Ab,IgA,Qn     0 - 11 APL U/mL <9  Recommendations-F5LEID:      Comment  Comment      Comment  Recommendations-PTGENE:      Comment  Additional Information  Comment  Hemoglobin A1C     4.8 - 5.6 % 12.2 (H)  Mean Plasma Glucose      303  Antithrombin Activity     75 - 120 % 88  Protein C Activity     73 - 180 % 149  Protein C, Total     60 - 150 % 101  Protein S Activity     63 - 140 % 121  Protein S Ag, Total     60 - 150 % 218 (H)  Homocysteine     0.0 - 15.0 umol/L 9.2    Assessment: As you may recall, he is a 60 y.o. Caucasian male with PMH of HTN, HLD, DM, MI and CAD s/p stent admitted on 05/01/15 for blurred vision, double vision for 2 days. Exam showed right CN VI palsy, DWI negative for pontine infarct, consistent with diabetic CN VI mononeuropathy. However, MRI did show small linear left occipital and 2 tiny L frontal lobe infarcts, likely embolic. MRA showed no significant stenosis or occlusion. CUS unremarkable, but TTE EF 35-40% with questionable posterior lateral wall  aneurysm. Outpt TEE showed EF 40-45% and no aneurysm or clot. LDL not able to calculate due to high TG, and A1C 12.2, hypercoagulable work up negative (ordered by Dr. Belenda Cruise). He was discharged with ASA. During the interval time, the patient has been doing well. Double vision much improved but still has mild right CN VI palsy. 30 day monitoring on going. He has stomach issue so that he did not take ASA daily will change to plavix and add low dose statin.   Plan:  - switch from ASA to plavix due to stomach discomfort - start lipitor 20mg  for stroke prevention - continue fenofibrate for high TG - check BP and glucose at home - Follow up with your primary care physician for stroke risk factor modification. Recommend maintain blood pressure goal <130/80, diabetes with hemoglobin A1c goal below 6.5% and lipids with LDL cholesterol goal below 70 mg/dL.  - follow up with cardiology - will follow up with the cardiac monitoring result - follow up in 2 months to evaluate for back to work  I spent more than 25 minutes of face to face time with the patient. Greater than 50% of time was spent in counseling and coordination of care, reviewing test results and medication, and discussing about medication changes and back to work evaluation.   No orders of the defined types were placed in this encounter.    Meds ordered this encounter  Medications  . sitaGLIPtin (JANUVIA) 100 MG tablet    Sig: Take by mouth.  . clopidogrel (PLAVIX) 75 MG tablet    Sig: Take 1 tablet (75 mg total) by mouth daily.    Dispense:  90 tablet    Refill:  3  . atorvastatin (LIPITOR) 20 MG tablet    Sig: Take 1 tablet (20 mg total) by mouth every morning.    Dispense:  90 tablet    Refill:  3    Patient Instructions  - switch from ASA to plavix since you have stomach problem - start lipitor 20mg  in the morning for stroke prevention - continue fenofibrate for high TG - check BP and glucose at home - Follow up with your  primary care physician for stroke risk factor modification. Recommend maintain blood pressure goal <130/80, diabetes with hemoglobin A1c goal below 6.5% and lipids with LDL cholesterol goal below 70 mg/dL.  - follow up with cardiology -  will follow up with the cardiac monitoring - follow up in 2 months to evaluate for back to work   Rosalin Hawking, MD PhD [facility] [address], [place], [place] [address] [phone]

## 2015-06-03 NOTE — Patient Instructions (Signed)
-   switch from ASA to plavix since you have stomach problem - start lipitor 20mg  in the morning for stroke prevention - continue fenofibrate for high TG - check BP and glucose at home - Follow up with your primary care physician for stroke risk factor modification. Recommend maintain blood pressure goal <130/80, diabetes with hemoglobin A1c goal below 6.5% and lipids with LDL cholesterol goal below 70 mg/dL.  - follow up with cardiology - will follow up with the cardiac monitoring - follow up in 2 months to evaluate for back to work

## 2015-06-03 NOTE — Telephone Encounter (Signed)
[employer] called regarding pt. Monitor shows SR and looks like pt may pushed a bottom by mistake. [employer] called pt with no answer. I called pt he states that a lead fell off and he connected back as soon as he was able to do so. Pt denies any symptoms.

## 2015-06-03 NOTE — Telephone Encounter (Signed)
Dr. Acie Fredrickson is aware

## 2015-08-01 ENCOUNTER — Ambulatory Visit (INDEPENDENT_AMBULATORY_CARE_PROVIDER_SITE_OTHER): Payer: BLUE CROSS/BLUE SHIELD | Admitting: Neurology

## 2015-08-01 ENCOUNTER — Encounter: Payer: Self-pay | Admitting: Neurology

## 2015-08-01 VITALS — BP 135/80 | HR 87 | Ht 63.0 in | Wt 176.0 lb

## 2015-08-01 DIAGNOSIS — E785 Hyperlipidemia, unspecified: Secondary | ICD-10-CM | POA: Diagnosis not present

## 2015-08-01 DIAGNOSIS — E1141 Type 2 diabetes mellitus with diabetic mononeuropathy: Secondary | ICD-10-CM

## 2015-08-01 DIAGNOSIS — E118 Type 2 diabetes mellitus with unspecified complications: Secondary | ICD-10-CM | POA: Insufficient documentation

## 2015-08-01 DIAGNOSIS — I10 Essential (primary) hypertension: Secondary | ICD-10-CM

## 2015-08-01 DIAGNOSIS — E1159 Type 2 diabetes mellitus with other circulatory complications: Secondary | ICD-10-CM | POA: Diagnosis not present

## 2015-08-01 DIAGNOSIS — I6340 Cerebral infarction due to embolism of unspecified cerebral artery: Secondary | ICD-10-CM | POA: Diagnosis not present

## 2015-08-01 NOTE — Progress Notes (Signed)
STROKE NEUROLOGY FOLLOW UP NOTE  NAME: Grant Moran DOB: 07/23/1955  REASON FOR VISIT: stroke follow up HISTORY FROM: pt and chart  Today we had the pleasure of seeing ASH KOSIOR in follow-up at our Neurology Clinic. Pt was accompanied by no one.   History Summary Mr. BROOKES PICK is a 60 y.o. male with history of HTN, HLD, DM, MI and CAD s/p stent admitted on 05/01/15 for blurred vision, double vision for 2 days. Denies any other brainstem deficit. Exam showed right CN VI palsy, DWI negative for pontine infarct, consistent with diabetic CN VI mononeuropathy. However, MRI did show small linear left occipital and 2 tiny L frontal lobe infarcts, likely embolic. MRA showed intracranial athero but no significant stenosis or occlusion. CUS unremarkable, but TTE EF 35-40% with questionable posterior lateral wall aneurysm. Cardiology consulted and recommended outpt TEE. LDL not able to calculate due to high TG, and A1C 12.2. His hypercoagulable work up negative (ordered by Dr. Belenda Cruise). He was discharged with ASA and arranged for outpt TEE and 30 day monitoring.   06/03/15 follow up - the patient has been doing well. Double vision much improved but still has mild right CN VI palsy. Had TEE which showed EF 40-45% and no aneurysm or clot seen. 30 day monitoring on going. Checked glucose at home, better controlled. BP [id number], but stated at home usually lower than that. He has stomach issue so that he did not take ASA daily. He is requesting the time for back to work.    Interval History During the interval time, pt has been doing well. Reported resolution of diplopia. He is driving now. And request back to work without restrictions. BP today 135/80 but he did not check BP at home. His glucose at home still more than 200 and he complains of one of his DM meds are too expensive to afford. Tolerating plavix and lipitor well.   REVIEW OF SYSTEMS: Full 14 system review of systems performed and notable  only for those listed below and in HPI above, all others are negative:  Constitutional:   Cardiovascular:  Ear/Nose/Throat:   Skin:  Eyes:   Respiratory:   Gastroitestinal:   Genitourinary:  Hematology/Lymphatic:   Endocrine:  Musculoskeletal:   Allergy/Immunology:   Neurological:   Psychiatric:  Sleep:   The following represents the patient's updated allergies and side effects list: No Known Allergies  The neurologically relevant items on the patient's problem list were reviewed on today's visit.  Neurologic Examination  A problem focused neurological exam (12 or more points of the single system neurologic examination, vital signs counts as 1 point, cranial nerves count for 8 points) was performed.  Blood pressure 135/80, pulse 87, height 5\' 3"  (1.6 m), weight 176 lb (79.833 kg).  General - Well nourished, well developed, in no apparent distress.  Ophthalmologic - Fundi not visualized due to eye movement.  Cardiovascular - Regular rate and rhythm.  Mental Status -  Level of arousal and orientation to time, place, and person were intact. Language including expression, naming, repetition, comprehension was assessed and found intact. Fund of Knowledge was assessed and was intact.  Cranial Nerves II - XII - II - Visual field intact OU. III, IV, VI - Extraocular movements intact. V - Facial sensation intact bilaterally. VII - Facial movement intact bilaterally. VIII - Hearing & vestibular intact bilaterally. X - Palate elevates symmetrically, mild to moderate dysarthria due to denture. XI - Chin turning & shoulder shrug intact  bilaterally. XII - Tongue protrusion intact.  Motor Strength - The patient's strength was normal in all extremities and pronator drift was absent.  Bulk was normal and fasciculations were absent.   Motor Tone - Muscle tone was assessed at the neck and appendages and was normal.  Reflexes - The patient's reflexes were 1+ in all extremities and he  had no pathological reflexes.  Sensory - Light touch, temperature/pinprick, vibration and proprioception, and Romberg testing were assessed and were normal    Coordination - The patient had normal movements in the hands and feet with no ataxia or dysmetria.  Tremor was absent.  Gait and Station - The patient's transfers, posture, gait, station, and turns were observed as normal.  Data reviewed: I personally reviewed the images and agree with the radiology interpretations.  Mr Jodene Nam Head Wo Contrast 05/01/2015 Intracranial atherosclerotic type changes most notable involving branch vessels as detailed above. No proximal major vessel significant stenosis or occlusion. Hypoplastic A1 segment left anterior cerebral artery. Prominent ectasia vertebral arteries with mild ectasia and basilar artery.   Mr Brain Wo Contrast 05/01/2015  Small linear infarct medial aspect left occipital lobe may explain the patient's visual disturbance. Two tiny infarcts left frontal lobe. Remote left lenticular nucleus and right caudate head infarct. Mild chronic small vessel disease type changes. No intracranial hemorrhage.   Carotid Doppler  There is 1-39% bilateral ICA stenosis. Vertebral artery flow is antegrade.  2D Echocardiogram  - Left ventricle: Inferior and posterior lateral hypokinesis Cannotr/o posterior lateral wall aneurysm. Recommend f/u echo withcontrast or cardiac MRI to furtherevaluate. The cavity size was moderately dilated. Wall thicknesswas normal. Systolic function was moderately reduced. Theestimated ejection fraction was in the range of 35% to 40%.Doppler parameters are consistent with abnormal left ventricularrelaxation (grade 1 diastolic dysfunction). - Left atrium: The atrium was mildly dilated. - Atrial septum: No defect or patent foramen ovale was identified.  TEE - Left ventricle: Systolic function was mildly to moderately reduced. The estimated ejection fraction was in the  range of 40% to 45%. Akinesis of the basalinferolateral myocardium. - Aortic valve: No evidence of vegetation. No evidence of vegetation. - Mitral valve: No evidence of vegetation. - Left atrium: No evidence of thrombus in the atrial cavity or appendage. - Atrial septum: No defect or patent foramen ovale was identified. - Tricuspid valve: No evidence of vegetation.  30 day cardiac monitoring - no afib  Component     Latest Ref Rng 05/03/2015  Cholesterol     0 - 200 mg/dL 187  Triglycerides     <150 mg/dL 675 (H)  HDL Cholesterol     >40 mg/dL 23 (L)  Total CHOL/HDL Ratio      8.1  VLDL     0 - 40 mg/dL UNABLE TO CALCULATE IF TRIGLYCERIDE OVER 400 mg/dL  LDL (calc)     0 - 99 mg/dL UNABLE TO CALCULATE IF TRIGLYCERIDE OVER 400 mg/dL  PTT Lupus Anticoagulant     0.0 - 40.6 sec 31.9  DRVVT     0.0 - 44.0 sec 42.5  Lupus Anticoag Interp      Comment:  Beta-2 Glycoprotein I Ab, IgG     0 - 20 GPI IgG units <9  Beta-2-Glycoprotein I IgM     0 - 32 GPI IgM units <9  Beta-2-Glycoprotein I IgA     0 - 25 GPI IgA units <9  Anticardiolipin Ab,IgG,Qn     0 - 14 GPL U/mL <9  Anticardiolipin Ab,IgM,Qn  0 - 12 MPL U/mL <9  Anticardiolipin Ab,IgA,Qn     0 - 11 APL U/mL <9  Recommendations-F5LEID:      Comment  Comment      Comment  Recommendations-PTGENE:      Comment  Additional Information      Comment  Hemoglobin A1C     4.8 - 5.6 % 12.2 (H)  Mean Plasma Glucose      303  Antithrombin Activity     75 - 120 % 88  Protein C Activity     73 - 180 % 149  Protein C, Total     60 - 150 % 101  Protein S Activity     63 - 140 % 121  Protein S Ag, Total     60 - 150 % 218 (H)  Homocysteine     0.0 - 15.0 umol/L 9.2    Assessment: As you may recall, he is a 60 y.o. Caucasian male with PMH of HTN, HLD, DM, MI and CAD s/p stent admitted on 05/01/15 for blurred vision, double vision for 2 days. Exam showed right CN VI palsy, DWI negative for pontine infarct,  consistent with diabetic CN VI mononeuropathy. However, MRI did show small linear left occipital and 2 tiny L frontal lobe infarcts, likely embolic. MRA showed no significant stenosis or occlusion. CUS unremarkable, but TTE EF 35-40% with questionable posterior lateral wall aneurysm. Outpt TEE showed EF 40-45% and no aneurysm or clot. LDL not able to calculate due to high TG, and A1C 12.2, hypercoagulable work up negative (ordered by Dr. Belenda Cruise). He was discharged with ASA. During the interval time, the patient has been doing well. Double vision much improved and no resolved. 30 day monitoring showed no afib. Tolerating plavix and add low dose statin well. Agree to back to work.   Plan:  - continue plavix and lipitor for stroke prevention - continue fenofibrate for high TG - check glucose at home and record and bring over to PCP for medication adjustment - ask PCP for prescription about BP monitoring device and check BP at home and record - Follow up with your primary care physician for stroke risk factor modification. Recommend maintain blood pressure goal <130/80, diabetes with hemoglobin A1c goal below 6.5% and lipids with LDL cholesterol goal below 70 mg/dL.  - back to work letter provided - follow up with cardiology - follow up in 6 months  I spent more than 25 minutes of face to face time with the patient. Greater than 50% of time was spent in counseling and coordination of care, medication compliance, and follow up with PCP for DM control as well as back to work instructions.   No orders of the defined types were placed in this encounter.    No orders of the defined types were placed in this encounter.    Patient Instructions  - continue plavix and lipitor for stroke prevention - continue fenofibrate for high TG - check glucose at home and record and bring over to PCP for medication adjustment - ask PCP for prescription about BP monitoring device and check BP at home and record -  Follow up with your primary care physician for stroke risk factor modification. Recommend maintain blood pressure goal <130/80, diabetes with hemoglobin A1c goal below 6.5% and lipids with LDL cholesterol goal below 70 mg/dL.  - follow up with cardiology - follow up in 6 months    Rosalin Hawking, MD PhD [facility] [address],  [place], [place] [address] [phone]

## 2015-08-01 NOTE — Patient Instructions (Signed)
-   continue plavix and lipitor for stroke prevention - continue fenofibrate for high TG - check glucose at home and record and bring over to PCP for medication adjustment - ask PCP for prescription about BP monitoring device and check BP at home and record - Follow up with your primary care physician for stroke risk factor modification. Recommend maintain blood pressure goal <130/80, diabetes with hemoglobin A1c goal below 6.5% and lipids with LDL cholesterol goal below 70 mg/dL.  - follow up with cardiology - follow up in 6 months

## 2016-01-30 ENCOUNTER — Ambulatory Visit: Payer: BLUE CROSS/BLUE SHIELD | Admitting: Neurology

## 2016-02-02 ENCOUNTER — Encounter: Payer: Self-pay | Admitting: Neurology

## 2016-11-15 ENCOUNTER — Other Ambulatory Visit: Payer: Self-pay | Admitting: Neurology

## 2016-11-15 DIAGNOSIS — I6340 Cerebral infarction due to embolism of unspecified cerebral artery: Secondary | ICD-10-CM

## 2016-11-29 ENCOUNTER — Emergency Department (HOSPITAL_COMMUNITY): Payer: BLUE CROSS/BLUE SHIELD

## 2016-11-29 ENCOUNTER — Encounter (HOSPITAL_COMMUNITY): Payer: Self-pay | Admitting: Emergency Medicine

## 2016-11-29 ENCOUNTER — Inpatient Hospital Stay (HOSPITAL_COMMUNITY)
Admission: EM | Admit: 2016-11-29 | Discharge: 2016-12-12 | DRG: 233 | Disposition: A | Discharge status: Home / Self Care | Payer: BLUE CROSS/BLUE SHIELD | Attending: Cardiothoracic Surgery | Admitting: Cardiothoracic Surgery

## 2016-11-29 DIAGNOSIS — Z800 Family history of malignant neoplasm of digestive organs: Secondary | ICD-10-CM

## 2016-11-29 DIAGNOSIS — I252 Old myocardial infarction: Secondary | ICD-10-CM

## 2016-11-29 DIAGNOSIS — J440 Chronic obstructive pulmonary disease with acute lower respiratory infection: Secondary | ICD-10-CM | POA: Diagnosis present

## 2016-11-29 DIAGNOSIS — R7989 Other specified abnormal findings of blood chemistry: Secondary | ICD-10-CM

## 2016-11-29 DIAGNOSIS — I110 Hypertensive heart disease with heart failure: Principal | ICD-10-CM | POA: Diagnosis present

## 2016-11-29 DIAGNOSIS — I5022 Chronic systolic (congestive) heart failure: Secondary | ICD-10-CM

## 2016-11-29 DIAGNOSIS — R0602 Shortness of breath: Secondary | ICD-10-CM | POA: Diagnosis present

## 2016-11-29 DIAGNOSIS — E785 Hyperlipidemia, unspecified: Secondary | ICD-10-CM

## 2016-11-29 DIAGNOSIS — R748 Abnormal levels of other serum enzymes: Secondary | ICD-10-CM | POA: Diagnosis not present

## 2016-11-29 DIAGNOSIS — E7800 Pure hypercholesterolemia, unspecified: Secondary | ICD-10-CM | POA: Diagnosis not present

## 2016-11-29 DIAGNOSIS — R778 Other specified abnormalities of plasma proteins: Secondary | ICD-10-CM | POA: Diagnosis present

## 2016-11-29 DIAGNOSIS — J189 Pneumonia, unspecified organism: Secondary | ICD-10-CM | POA: Diagnosis present

## 2016-11-29 DIAGNOSIS — T501X5A Adverse effect of loop [high-ceiling] diuretics, initial encounter: Secondary | ICD-10-CM | POA: Diagnosis not present

## 2016-11-29 DIAGNOSIS — Z951 Presence of aortocoronary bypass graft: Secondary | ICD-10-CM

## 2016-11-29 DIAGNOSIS — I10 Essential (primary) hypertension: Secondary | ICD-10-CM | POA: Diagnosis not present

## 2016-11-29 DIAGNOSIS — I5021 Acute systolic (congestive) heart failure: Secondary | ICD-10-CM | POA: Diagnosis not present

## 2016-11-29 DIAGNOSIS — I959 Hypotension, unspecified: Secondary | ICD-10-CM | POA: Diagnosis not present

## 2016-11-29 DIAGNOSIS — Z23 Encounter for immunization: Secondary | ICD-10-CM | POA: Diagnosis not present

## 2016-11-29 DIAGNOSIS — I2510 Atherosclerotic heart disease of native coronary artery without angina pectoris: Secondary | ICD-10-CM | POA: Diagnosis present

## 2016-11-29 DIAGNOSIS — Z8673 Personal history of transient ischemic attack (TIA), and cerebral infarction without residual deficits: Secondary | ICD-10-CM | POA: Diagnosis not present

## 2016-11-29 DIAGNOSIS — R0600 Dyspnea, unspecified: Secondary | ICD-10-CM

## 2016-11-29 DIAGNOSIS — E1159 Type 2 diabetes mellitus with other circulatory complications: Secondary | ICD-10-CM

## 2016-11-29 DIAGNOSIS — E1141 Type 2 diabetes mellitus with diabetic mononeuropathy: Secondary | ICD-10-CM | POA: Diagnosis present

## 2016-11-29 DIAGNOSIS — Z7984 Long term (current) use of oral hypoglycemic drugs: Secondary | ICD-10-CM

## 2016-11-29 DIAGNOSIS — E876 Hypokalemia: Secondary | ICD-10-CM

## 2016-11-29 DIAGNOSIS — Z87891 Personal history of nicotine dependence: Secondary | ICD-10-CM | POA: Diagnosis not present

## 2016-11-29 DIAGNOSIS — E118 Type 2 diabetes mellitus with unspecified complications: Secondary | ICD-10-CM | POA: Diagnosis present

## 2016-11-29 DIAGNOSIS — I639 Cerebral infarction, unspecified: Secondary | ICD-10-CM | POA: Diagnosis not present

## 2016-11-29 DIAGNOSIS — I340 Nonrheumatic mitral (valve) insufficiency: Secondary | ICD-10-CM | POA: Diagnosis present

## 2016-11-29 DIAGNOSIS — I5023 Acute on chronic systolic (congestive) heart failure: Secondary | ICD-10-CM | POA: Diagnosis present

## 2016-11-29 DIAGNOSIS — E782 Mixed hyperlipidemia: Secondary | ICD-10-CM | POA: Diagnosis not present

## 2016-11-29 DIAGNOSIS — I509 Heart failure, unspecified: Secondary | ICD-10-CM

## 2016-11-29 DIAGNOSIS — I214 Non-ST elevation (NSTEMI) myocardial infarction: Secondary | ICD-10-CM | POA: Diagnosis present

## 2016-11-29 DIAGNOSIS — I255 Ischemic cardiomyopathy: Secondary | ICD-10-CM | POA: Diagnosis present

## 2016-11-29 DIAGNOSIS — Z8249 Family history of ischemic heart disease and other diseases of the circulatory system: Secondary | ICD-10-CM

## 2016-11-29 DIAGNOSIS — Z955 Presence of coronary angioplasty implant and graft: Secondary | ICD-10-CM

## 2016-11-29 DIAGNOSIS — Z7902 Long term (current) use of antithrombotics/antiplatelets: Secondary | ICD-10-CM | POA: Diagnosis not present

## 2016-11-29 DIAGNOSIS — I25110 Atherosclerotic heart disease of native coronary artery with unstable angina pectoris: Secondary | ICD-10-CM | POA: Diagnosis present

## 2016-11-29 DIAGNOSIS — E119 Type 2 diabetes mellitus without complications: Secondary | ICD-10-CM

## 2016-11-29 DIAGNOSIS — D62 Acute posthemorrhagic anemia: Secondary | ICD-10-CM | POA: Diagnosis not present

## 2016-11-29 DIAGNOSIS — E1169 Type 2 diabetes mellitus with other specified complication: Secondary | ICD-10-CM | POA: Diagnosis present

## 2016-11-29 DIAGNOSIS — J181 Lobar pneumonia, unspecified organism: Secondary | ICD-10-CM | POA: Diagnosis not present

## 2016-11-29 DIAGNOSIS — Z09 Encounter for follow-up examination after completed treatment for conditions other than malignant neoplasm: Secondary | ICD-10-CM

## 2016-11-29 DIAGNOSIS — I2583 Coronary atherosclerosis due to lipid rich plaque: Secondary | ICD-10-CM | POA: Diagnosis not present

## 2016-11-29 DIAGNOSIS — E1165 Type 2 diabetes mellitus with hyperglycemia: Secondary | ICD-10-CM | POA: Diagnosis present

## 2016-11-29 DIAGNOSIS — Z01810 Encounter for preprocedural cardiovascular examination: Secondary | ICD-10-CM | POA: Diagnosis not present

## 2016-11-29 DIAGNOSIS — Z9689 Presence of other specified functional implants: Secondary | ICD-10-CM

## 2016-11-29 HISTORY — DX: Chronic systolic (congestive) heart failure: I50.22

## 2016-11-29 LAB — BASIC METABOLIC PANEL
Anion gap: 12 (ref 5–15)
BUN: 11 mg/dL (ref 6–20)
CO2: 24 mmol/L (ref 22–32)
CREATININE: 0.67 mg/dL (ref 0.61–1.24)
Calcium: 8.7 mg/dL — ABNORMAL LOW (ref 8.9–10.3)
Chloride: 101 mmol/L (ref 101–111)
GFR calc Af Amer: >60 mL/min (ref >60)
GLUCOSE: 179 mg/dL — AB (ref 65–99)
POTASSIUM: 3.2 mmol/L — AB (ref 3.5–5.1)
Sodium: 137 mmol/L (ref 135–145)

## 2016-11-29 LAB — COMPREHENSIVE METABOLIC PANEL
ALK PHOS: 70 U/L (ref 38–126)
ALT: 16 U/L — AB (ref 17–63)
AST: 21 U/L (ref 15–41)
Albumin: 3.7 g/dL (ref 3.5–5.0)
Anion gap: 12 (ref 5–15)
BILIRUBIN TOTAL: 0.9 mg/dL (ref 0.3–1.2)
BUN: 12 mg/dL (ref 6–20)
CALCIUM: 9.1 mg/dL (ref 8.9–10.3)
CO2: 23 mmol/L (ref 22–32)
CREATININE: 0.73 mg/dL (ref 0.61–1.24)
Chloride: 103 mmol/L (ref 101–111)
Glucose, Bld: 232 mg/dL — ABNORMAL HIGH (ref 65–99)
Potassium: 3.4 mmol/L — ABNORMAL LOW (ref 3.5–5.1)
Sodium: 138 mmol/L (ref 135–145)
TOTAL PROTEIN: 6.8 g/dL (ref 6.5–8.1)

## 2016-11-29 LAB — BRAIN NATRIURETIC PEPTIDE: B NATRIURETIC PEPTIDE 5: 1413 pg/mL — AB (ref 0.0–100.0)

## 2016-11-29 LAB — CBC WITH DIFFERENTIAL/PLATELET
BASOS ABS: 0 10*3/uL (ref 0.0–0.1)
BASOS PCT: 0 %
EOS ABS: 0.1 10*3/uL (ref 0.0–0.7)
EOS PCT: 2 %
HEMATOCRIT: 37.3 % — AB (ref 39.0–52.0)
HEMOGLOBIN: 12.8 g/dL — AB (ref 13.0–17.0)
Lymphocytes Relative: 23 %
Lymphs Abs: 2.1 10*3/uL (ref 0.7–4.0)
MCH: 29.4 pg (ref 26.0–34.0)
MCHC: 34.3 g/dL (ref 30.0–36.0)
MCV: 85.7 fL (ref 78.0–100.0)
Monocytes Absolute: 0.8 10*3/uL (ref 0.1–1.0)
Monocytes Relative: 9 %
NEUTROS PCT: 66 %
Neutro Abs: 6.1 10*3/uL (ref 1.7–7.7)
Platelets: 263 10*3/uL (ref 150–400)
RBC: 4.35 MIL/uL (ref 4.22–5.81)
RDW: 13.7 % (ref 11.5–15.5)
WBC: 9.2 10*3/uL (ref 4.0–10.5)

## 2016-11-29 LAB — GLUCOSE, CAPILLARY
GLUCOSE-CAPILLARY: 244 mg/dL — AB (ref 65–99)
Glucose-Capillary: 252 mg/dL — ABNORMAL HIGH (ref 65–99)

## 2016-11-29 LAB — TROPONIN I
TROPONIN I: 0.49 ng/mL — AB (ref <0.03)
Troponin I: 0.55 ng/mL (ref <0.03)
Troponin I: 0.53 ng/mL (ref <0.03)

## 2016-11-29 LAB — TSH: TSH: 3.158 u[IU]/mL (ref 0.350–4.500)

## 2016-11-29 LAB — D-DIMER, QUANTITATIVE (NOT AT ARMC): D DIMER QUANT: 0.28 ug{FEU}/mL (ref 0.00–0.50)

## 2016-11-29 LAB — MAGNESIUM: Magnesium: 1.5 mg/dL — ABNORMAL LOW (ref 1.7–2.4)

## 2016-11-29 MED ORDER — DEXTROSE 5 % IV SOLN
1.0000 g | Freq: Once | INTRAVENOUS | Status: AC
  Administered 2016-11-29: 1 g via INTRAVENOUS
  Filled 2016-11-29: qty 10

## 2016-11-29 MED ORDER — SODIUM CHLORIDE 0.9% FLUSH: 3.0000 mL | INTRAVENOUS | Status: DC | PRN

## 2016-11-29 MED ORDER — INSULIN ASPART 100 UNIT/ML ~~LOC~~ SOLN
0.0000 [IU] | Freq: Three times a day (TID) | SUBCUTANEOUS | Status: DC
  Administered 2016-11-29: 8 [IU] via SUBCUTANEOUS
  Administered 2016-11-30: 15 [IU] via SUBCUTANEOUS
  Administered 2016-11-30 – 2016-12-01 (×3): 5 [IU] via SUBCUTANEOUS

## 2016-11-29 MED ORDER — SODIUM CHLORIDE 0.9 % IV SOLN: 250.0000 mL | INTRAVENOUS | Status: DC | PRN

## 2016-11-29 MED ORDER — ONDANSETRON HCL 4 MG/2ML IJ SOLN: 4.0000 mg | Freq: Four times a day (QID) | INTRAMUSCULAR | Status: DC | PRN

## 2016-11-29 MED ORDER — ACETAMINOPHEN 325 MG PO TABS: 650.0000 mg | ORAL_TABLET | ORAL | Status: DC | PRN

## 2016-11-29 MED ORDER — ASPIRIN 81 MG PO CHEW
324.0000 mg | CHEWABLE_TABLET | Freq: Once | ORAL | Status: AC
  Administered 2016-11-29: 324 mg via ORAL
  Filled 2016-11-29: qty 4

## 2016-11-29 MED ORDER — FUROSEMIDE 10 MG/ML IJ SOLN: 20.0000 mg | Freq: Every day | INTRAMUSCULAR | Status: DC

## 2016-11-29 MED ORDER — ALBUTEROL SULFATE (2.5 MG/3ML) 0.083% IN NEBU
5.0000 mg | INHALATION_SOLUTION | Freq: Once | RESPIRATORY_TRACT | Status: AC
  Administered 2016-11-29: 5 mg via RESPIRATORY_TRACT
  Filled 2016-11-29: qty 6

## 2016-11-29 MED ORDER — ENOXAPARIN SODIUM 40 MG/0.4ML ~~LOC~~ SOLN: 40.0000 mg | SUBCUTANEOUS | Status: DC

## 2016-11-29 MED ORDER — HEPARIN (PORCINE) IN NACL 100-0.45 UNIT/ML-% IJ SOLN
1450.0000 [IU]/h | INTRAMUSCULAR | Status: DC
  Administered 2016-11-29: 1150 [IU]/h via INTRAVENOUS
  Administered 2016-11-30 – 2016-12-02 (×3): 1300 [IU]/h via INTRAVENOUS
  Filled 2016-11-29 (×5): qty 250

## 2016-11-29 MED ORDER — CLOPIDOGREL BISULFATE 75 MG PO TABS
75.0000 mg | ORAL_TABLET | Freq: Every day | ORAL | Status: DC
  Administered 2016-11-30 – 2016-12-03 (×4): 75 mg via ORAL
  Filled 2016-11-29 (×4): qty 1

## 2016-11-29 MED ORDER — NITROGLYCERIN 0.4 MG SL SUBL: 0.4000 mg | SUBLINGUAL_TABLET | SUBLINGUAL | Status: DC | PRN

## 2016-11-29 MED ORDER — PNEUMOCOCCAL VAC POLYVALENT 25 MCG/0.5ML IJ INJ
0.5000 mL | INJECTION | INTRAMUSCULAR | Status: AC
  Administered 2016-11-30: 0.5 mL via INTRAMUSCULAR
  Filled 2016-11-29: qty 0.5

## 2016-11-29 MED ORDER — POTASSIUM CHLORIDE CRYS ER 20 MEQ PO TBCR
40.0000 meq | EXTENDED_RELEASE_TABLET | Freq: Once | ORAL | Status: AC
  Administered 2016-11-29: 40 meq via ORAL
  Filled 2016-11-29: qty 2

## 2016-11-29 MED ORDER — FUROSEMIDE 10 MG/ML IJ SOLN
20.0000 mg | Freq: Once | INTRAMUSCULAR | Status: AC
  Administered 2016-11-29: 20 mg via INTRAVENOUS
  Filled 2016-11-29: qty 2

## 2016-11-29 MED ORDER — INSULIN ASPART 100 UNIT/ML ~~LOC~~ SOLN
0.0000 [IU] | Freq: Every day | SUBCUTANEOUS | Status: DC
  Administered 2016-11-29: 2 [IU] via SUBCUTANEOUS
  Administered 2016-11-30: 3 [IU] via SUBCUTANEOUS

## 2016-11-29 MED ORDER — LOSARTAN POTASSIUM 50 MG PO TABS
50.0000 mg | ORAL_TABLET | Freq: Every day | ORAL | Status: DC
  Administered 2016-11-30 – 2016-12-01 (×2): 50 mg via ORAL
  Filled 2016-11-29 (×2): qty 1

## 2016-11-29 MED ORDER — CARVEDILOL 12.5 MG PO TABS
12.5000 mg | ORAL_TABLET | Freq: Two times a day (BID) | ORAL | Status: DC
  Administered 2016-11-29 – 2016-12-02 (×7): 12.5 mg via ORAL
  Filled 2016-11-29 (×7): qty 1

## 2016-11-29 MED ORDER — INSULIN GLARGINE 100 UNIT/ML ~~LOC~~ SOLN
5.0000 [IU] | Freq: Every day | SUBCUTANEOUS | Status: DC
  Administered 2016-11-29: 5 [IU] via SUBCUTANEOUS
  Filled 2016-11-29: qty 0.05

## 2016-11-29 MED ORDER — ATORVASTATIN CALCIUM 80 MG PO TABS
80.0000 mg | ORAL_TABLET | Freq: Every day | ORAL | Status: DC
  Administered 2016-11-30 – 2016-12-12 (×12): 80 mg via ORAL
  Filled 2016-11-29: qty 1
  Filled 2016-11-29 (×3): qty 2
  Filled 2016-11-29 (×8): qty 1

## 2016-11-29 MED ORDER — SODIUM CHLORIDE 0.9% FLUSH
3.0000 mL | Freq: Two times a day (BID) | INTRAVENOUS | Status: DC
  Administered 2016-11-30 – 2016-12-06 (×13): 3 mL via INTRAVENOUS

## 2016-11-29 MED ORDER — POTASSIUM CHLORIDE CRYS ER 20 MEQ PO TBCR
20.0000 meq | EXTENDED_RELEASE_TABLET | Freq: Two times a day (BID) | ORAL | Status: DC
  Administered 2016-11-29 – 2016-12-06 (×16): 20 meq via ORAL
  Filled 2016-11-29 (×16): qty 1

## 2016-11-29 MED ORDER — DEXTROSE 5 % IV SOLN
1.0000 g | INTRAVENOUS | Status: AC
  Administered 2016-11-30 – 2016-12-06 (×7): 1 g via INTRAVENOUS
  Filled 2016-11-29 (×8): qty 10

## 2016-11-29 MED ORDER — ASPIRIN EC 81 MG PO TBEC
81.0000 mg | DELAYED_RELEASE_TABLET | Freq: Every day | ORAL | Status: DC
  Administered 2016-11-30 – 2016-12-06 (×6): 81 mg via ORAL
  Filled 2016-11-29 (×6): qty 1

## 2016-11-29 MED ORDER — CALCIUM CARBONATE ANTACID 500 MG PO CHEW: 2.0000 | CHEWABLE_TABLET | Freq: Every day | ORAL | Status: DC | PRN

## 2016-11-29 MED ORDER — AZITHROMYCIN 250 MG PO TABS
500.0000 mg | ORAL_TABLET | Freq: Once | ORAL | Status: AC
  Administered 2016-11-29: 500 mg via ORAL
  Filled 2016-11-29: qty 2

## 2016-11-29 MED ORDER — DEXTROSE 5 % IV SOLN
500.0000 mg | INTRAVENOUS | Status: AC
  Administered 2016-11-30 – 2016-12-06 (×7): 500 mg via INTRAVENOUS
  Filled 2016-11-29 (×9): qty 500

## 2016-11-29 MED ORDER — SODIUM CHLORIDE 0.9% FLUSH
3.0000 mL | Freq: Two times a day (BID) | INTRAVENOUS | Status: DC
  Administered 2016-11-29: 3 mL via INTRAVENOUS

## 2016-11-29 MED ORDER — LOSARTAN POTASSIUM 50 MG PO TABS
50.0000 mg | ORAL_TABLET | Freq: Every day | ORAL | Status: DC
Start: 2016-11-29 — End: 2016-11-29

## 2016-11-29 MED ORDER — HEPARIN BOLUS VIA INFUSION
3500.0000 [IU] | Freq: Once | INTRAVENOUS | Status: AC
  Administered 2016-11-29: 3500 [IU] via INTRAVENOUS
  Filled 2016-11-29: qty 3500

## 2016-11-29 MED ORDER — MAGNESIUM SULFATE 4 GM/100ML IV SOLN
4.0000 g | Freq: Once | INTRAVENOUS | Status: AC
  Administered 2016-11-29: 4 g via INTRAVENOUS
  Filled 2016-11-29: qty 100

## 2016-11-29 MED ORDER — FENOFIBRATE 160 MG PO TABS
160.0000 mg | ORAL_TABLET | Freq: Every day | ORAL | Status: DC
  Administered 2016-11-30 – 2016-12-06 (×7): 160 mg via ORAL
  Filled 2016-11-29 (×7): qty 1

## 2016-11-29 NOTE — H&P (Signed)
History and Physical    Grant Moran QZE:[id number] DOB: [birth year] DOA: 11/29/2016  PCP: System, Provider Not In  Patient coming from: Home  I have personally briefly reviewed patient's old medical records in [place]  Chief Complaint: Shortness of breath  HPI: Grant Moran is a 61 y.o. male with medical history significant of diabetes mellitus, hyperlipidemia, hypertension, history of CVA with no residual weakness, coronary artery disease with 2-D echo from 05/02/2015 with a EF of 22-63%, grade 1 diastolic dysfunction, history of MI 2001 with ulcerated lesion in the RCA status post BMS with restenosis and treatment with angioplasty and brachytherapy 04/21/2000 with residual 51M/PLA and proximal LAD disease 70%. Patient with no prior history of CHF. Patient presented to the ED with a 2 to three-day history of worsening shortness of breath with paroxysmal nocturnal dyspnea, orthopnea, cough with whitish sputum, nausea, bout of emesis. Patient states over the past night had to sleep sitting up due to worsening symptoms. Patient states shortness of breath occurring at rest and on exertion. Patient denies any fevers, no chills, no syncope, no dizziness, no lightheadedness, no chest pain, no diarrhea, no constipation, no melena, no hematemesis, no hematochezia, no sick contacts, no lower extremity edema, no recent weight gain, no recent TRAVEL, no recent surgeries.  ED Course: Patient seen in the ED with comprehensive metabolic profile with a potassium of 3.4, glucose of 232 otherwise was within normal limits. BNP was elevated at 1413. First set of troponin was elevated at 0.49. CBC done was unremarkable. D-dimer was 0.28. Chest x-ray done showed bronchitic type markings, patchy reticular density about the right hilum could be atelectasis or bronchopneumonia. Patient given a dose of IV Rocephin and azithromycin. [facility] will call to admit the patient for further evaluation and  management.  Review of Systems: As per HPI otherwise 10 point review of systems negative.   Past Medical History:  Diagnosis Date  . CAD (coronary artery disease)   . Diabetes mellitus ([place])   . Hyperlipidemia   . Hypertension   . Myocardial infarction ([place])    OCT 2001-He under went angioplasty  and  stenting  of the RCA and distal RCA   . Stroke [facility]     Past Surgical History:  Procedure Laterality Date  . ANKLE SURGERY     Trauma  . fx left arm    . stent sx    . TEE WITHOUT CARDIOVERSION N/A 05/07/2015   Procedure: TRANSESOPHAGEAL ECHOCARDIOGRAM (TEE);  Surgeon: Thayer Headings, MD;  Location: [facility] ENDOSCOPY;  Service: Cardiovascular;  Laterality: N/A;     reports that he quit smoking about 26 years ago. His smoking use included Cigarettes. He has a 40.00 pack-year smoking history. He has never used smokeless tobacco. He reports that he drinks about 0.6 oz of alcohol per week . He reports that he does not use drugs.  No Known Allergies  Family History  Problem Relation Age of Onset  . Cancer Mother        Colon  . Heart attack Father        CABG  . Heart attack Sister   . Heart attack Maternal Grandfather   . Heart attack Paternal Grandfather   Mother deceased age 45 from cancer. Father deceased age 49 status post CABG with a history of coronary artery disease.   Prior to Admission medications   Medication Sig Start Date End Date Taking? Authorizing Provider  atorvastatin (LIPITOR) 80 MG tablet Take 1 tablet  by mouth daily. 10/31/16  Yes [provider]  calcium carbonate (TUMS - DOSED IN MG ELEMENTAL CALCIUM) 500 MG chewable tablet Chew 2 tablets by mouth daily as needed for indigestion or heartburn.   Yes [provider]  clopidogrel (PLAVIX) 75 MG tablet Take 1 tablet (75 mg total) by mouth daily. 06/03/15  Yes Rosalin Hawking, MD  fenofibrate 160 MG tablet Take 160 mg by mouth daily. 04/02/15 11/29/16 Yes [provider]  glyBURIDE-metformin  (GLUCOVANCE) 5-500 MG tablet Take 1 tablet by mouth 2 (two) times daily.   Yes [provider]  INVOKANA 300 MG TABS tablet Take 300 mg by mouth daily. 04/16/15  Yes [provider]  losartan (COZAAR) 50 MG tablet Take 1 tablet (50 mg total) by mouth daily. 05/03/15  Yes Thurnell Lose, MD  metoprolol succinate (TOPROL-XL) 100 MG 24 hr tablet Take 100 mg by mouth daily. Take with or immediately following a meal.   Yes [provider]  nitroGLYCERIN (NITROSTAT) 0.4 MG SL tablet Place 0.4 mg under the tongue every 5 (five) minutes as needed for chest pain.    Yes [provider]  sitaGLIPtin (JANUVIA) 100 MG tablet Take by mouth. 05/30/15 11/29/16 Yes [provider]    Physical Exam: Vitals:   11/29/16 1200 11/29/16 1230 11/29/16 1300 11/29/16 1329  BP: (!) 134/92 (!) 135/100 128/85 119/77  Pulse: 92 86 87 87  Resp: 19 (!) 29 (!) 21 (!) 21  Temp:    97.8 F (36.6 C)  TempSrc:    Oral  SpO2: 97% 94% 99% 97%  Weight:    77 kg (169 lb 11.2 oz)  Height:    5\' 3"  (1.6 m)    Constitutional: NAD, calm, comfortable Vitals:   11/29/16 1200 11/29/16 1230 11/29/16 1300 11/29/16 1329  BP: (!) 134/92 (!) 135/100 128/85 119/77  Pulse: 92 86 87 87  Resp: 19 (!) 29 (!) 21 (!) 21  Temp:    97.8 F (36.6 C)  TempSrc:    Oral  SpO2: 97% 94% 99% 97%  Weight:    77 kg (169 lb 11.2 oz)  Height:    5\' 3"  (1.6 m)   Eyes: PERRL, lids and conjunctivae normal ENMT: Mucous membranes are moist. Posterior pharynx clear of any exudate or lesions.Normal dentition.  Neck: normal, supple, no masses, no thyromegaly Respiratory: clear to auscultation bilaterally, no wheezing, no crackles. Normal respiratory effort. No accessory muscle use.  Cardiovascular: Regular rate and rhythm, no murmurs / rubs / gallops. No extremity edema. 2+ pedal pulses. No carotid bruits.  Abdomen: no tenderness, no masses palpated. No hepatosplenomegaly. Bowel sounds positive.  Musculoskeletal:  no clubbing / cyanosis. No joint deformity upper and lower extremities. Good ROM, no contractures. Normal muscle tone.  Skin: no rashes, lesions, ulcers. No induration Neurologic: CN 2-12 grossly intact. Sensation intact, DTR normal. Strength 5/5 in all 4.  Psychiatric: Normal judgment and insight. Alert and oriented x 3. Normal mood.   Labs on Admission: I have personally reviewed following labs and imaging studies  CBC:  Recent Labs Lab 11/29/16 0936  WBC 9.2  NEUTROABS 6.1  HGB 12.8*  HCT 37.3*  MCV 85.7  PLT 169   Basic Metabolic Panel:  Recent Labs Lab 11/29/16 0936  NA 138  K 3.4*  CL 103  CO2 23  GLUCOSE 232*  BUN 12  CREATININE 0.73  CALCIUM 9.1  MG 1.5*   GFR: Estimated Creatinine Clearance: 89 mL/min (by C-G formula based  on SCr of 0.73 mg/dL). Liver Function Tests:  Recent Labs Lab 11/29/16 0936  AST 21  ALT 16*  ALKPHOS 70  BILITOT 0.9  PROT 6.8  ALBUMIN 3.7   No results for input(s): LIPASE, AMYLASE in the last 168 hours. No results for input(s): AMMONIA in the last 168 hours. Coagulation Profile: No results for input(s): INR, PROTIME in the last 168 hours. Cardiac Enzymes:  Recent Labs Lab 11/29/16 0936  TROPONINI 0.49*   BNP (last 3 results) No results for input(s): PROBNP in the last 8760 hours. HbA1C: No results for input(s): HGBA1C in the last 72 hours. CBG:  Recent Labs Lab 11/29/16 1602  GLUCAP 252*   Lipid Profile: No results for input(s): CHOL, HDL, LDLCALC, TRIG, CHOLHDL, LDLDIRECT in the last 72 hours. Thyroid Function Tests: No results for input(s): TSH, T4TOTAL, FREET4, T3FREE, THYROIDAB in the last 72 hours. Anemia Panel: No results for input(s): VITAMINB12, FOLATE, FERRITIN, TIBC, IRON, RETICCTPCT in the last 72 hours. Urine analysis:    Component Value Date/Time   COLORURINE YELLOW 05/01/2015 [place] 05/01/2015 1636   LABSPEC 1.020 05/01/2015 1636   PHURINE 5.0 05/01/2015 1636   GLUCOSEU  >1000 (A) 05/01/2015 1636   HGBUR NEGATIVE 05/01/2015 1636   BILIRUBINUR NEGATIVE 05/01/2015 1636   KETONESUR 15 (A) 05/01/2015 1636   PROTEINUR NEGATIVE 05/01/2015 1636   NITRITE NEGATIVE 05/01/2015 1636   LEUKOCYTESUR NEGATIVE 05/01/2015 1636    Radiological Exams on Admission: Dg Chest 2 View  Result Date: 11/29/2016 CLINICAL DATA:  Shortness of breath EXAM: CHEST  2 VIEW COMPARISON:  05/01/2015 FINDINGS: Mildly low lung volumes interstitial coarsening. There is subtly asymmetric reticular opacity about the right hila. No edema, effusion, or pneumothorax. Normal heart size. Stable mediastinal contours. Coronary stenting. IMPRESSION: Bronchitic type markings. Patchy reticular density about the right hilum could be atelectasis or bronchopneumonia. Electronically Signed   By: Monte Fantasia M.D.   On: 11/29/2016 09:31    EKG: Independently reviewed. Nonspecific ST-T wave changes.  Assessment/Plan Principal Problem:   Acute on chronic systolic CHF (congestive heart failure) (HCC) Active Problems:   Elevated troponin   CAP (community acquired pneumonia)   CAD (coronary artery disease)   Cerebral infarction ([place])   Diabetes ([place])   Hypertension   Diabetic mononeuropathy associated with type 2 diabetes mellitus ([place])   HLD (hyperlipidemia)   Type 2 diabetes mellitus with circulatory disorder ([place])   Essential hypertension   CHF (congestive heart failure) (HCC)   Hypokalemia  (please populate well all problems here in Problem List. (For example, if patient is on BP meds at home and you resume or decide to hold them, it is a problem that needs to be her. Same for CAD, COPD, HLD and so on)  #1 acute systolic heart failure patient with no prior history of heart failure however does have a depressed ejection fraction of 35-40%, presented with worsening shortness of breath, orthopnea, paroxysmal nocturnal dyspnea. Patient noted to have a elevated first set of troponin and concern for  possible acute coronary syndrome. Patient has been seen in consultation by cardiology were recommending cardiac enzymes every 6 hours 3. Cycle cardiac enzymes every 6 hours 3. Check a 2-D echo. Repeat chest x-ray in the morning. Patient noted to have elevated troponin and a such cardiology consultation obtained. Was recommended per cardiology to place patient on Lasix 20 mg IV daily, strict I's and O's, daily weights, IV heparin. Follow.  #2 community acquired pneumonia Per chest x-ray.  Patient had presented with shortness of breath and cough. Check a sputum Gram stain and culture. Check a urine Legionella antigen. Check a urine pneumococcus antigen. Place empirically on IV Rocephin and IV azithromycin. Follow.  #3 diabetes mellitus Check a hemoglobin A1c. Start on Lantus 5 units daily. Sliding scale insulin.  #4 hypertension Stable.  #5 hypokalemia Replete.  #6 hyperlipidemia Check a statin level.  DVT prophylaxis: Heparin Code Status: Full Family Communication: Updated patient. No family present. Disposition Plan: Home once cardiac workup has been completed, patient treated for CHF exacerbation and pneumonia and close to baseline. Consults called: Cardiology: Dr. Johnsie Cancel Admission status: Admit to inpatient.   [facility] MD [facility] Pager [phone]  If 7PM-7AM, please contact night-coverage [email or web address] Password [email or web address]  11/29/2016, 4:17 PM

## 2016-11-29 NOTE — Progress Notes (Signed)
PT Cancellation Note  Patient Details Name: Grant Moran MRN: [id number] DOB: 1956-02-23   Cancelled Treatment:    Reason Eval/Treat Not Completed: Other (comment).  Eating his meal and will try later as time and pt allow.   Ramond Dial 11/29/2016, 3:33 PM  Mee Hives, PT MS Acute Rehab Dept. Number: [place]

## 2016-11-29 NOTE — Evaluation (Signed)
Physical Therapy Evaluation Patient Details Name: Grant Moran MRN: [id number] DOB: [birth year] Today's Date: 11/29/2016   History of Present Illness  61 yo male with onset of elevated troponins, of CHF and requiring O2 initially.  PMHx:  DM, HTN, Stroke, CAD, MI, TEE without cardioversion, angioplasty,   Clinical Impression  Pt is having some difficulty with endurance, notably with effort to walk 150' he had sats down to 89% and pulse up to 108.  He is not terribly distressed with these numbers but will not be able to do his usual effort with walking unless pt is either on O2 or closely monitored to ensure he is not dropping excessively.  Will follow until he is ready to go to home with HHPT and will need staff to monitor during nursing walks as well.      Follow Up Recommendations Home health PT;Supervision - Intermittent    Equipment Recommendations  None recommended by PT    Recommendations for Other Services       Precautions / Restrictions Precautions Precautions: Fall (telemetry) Restrictions Weight Bearing Restrictions: No      Mobility  Bed Mobility Overal bed mobility: Modified Independent             General bed mobility comments: using flat HOB to get up  Transfers Overall transfer level: Needs assistance Equipment used: Rolling walker (2 wheeled);1 person hand held assist Transfers: Sit to/from Stand Sit to Stand: Min guard            Ambulation/Gait Ambulation/Gait assistance: Min guard Ambulation Distance (Feet): 150 Feet Assistive device: 1 person hand held assist Gait Pattern/deviations: Step-through pattern;Decreased stride length;Wide base of support;Shuffle Gait velocity: reduced Gait velocity interpretation: Below normal speed for age/gender General Gait Details: walked on the hallway with turns and maneuvering, not noting LOB but SOB with effort  Stairs            Wheelchair Mobility    Modified Rankin (Stroke Patients Only)        Balance Overall balance assessment: Needs assistance Sitting-balance support: Feet supported Sitting balance-Leahy Scale: Good     Standing balance support: Single extremity supported Standing balance-Leahy Scale: Fair                               Pertinent Vitals/Pain Pain Assessment: No/denies pain    Home Living Family/patient expects to be discharged to:: Private residence Living Arrangements: Non-relatives/Friends Available Help at Discharge: Friend(s);Available PRN/intermittently Type of Home: House Home Access: Stairs to enter Entrance Stairs-Rails: Right;Left;Can reach both Entrance Stairs-Number of Steps: 3 Home Layout: One level Home Equipment: None      Prior Function Level of Independence: Independent         Comments: previously drove     Hand Dominance        Extremity/Trunk Assessment   Upper Extremity Assessment Upper Extremity Assessment: Overall WFL for tasks assessed    Lower Extremity Assessment Lower Extremity Assessment: Overall WFL for tasks assessed    Cervical / Trunk Assessment Cervical / Trunk Assessment: Normal  Communication   Communication: No difficulties  Cognition Arousal/Alertness: Awake/alert Behavior During Therapy: WFL for tasks assessed/performed Overall Cognitive Status: Within Functional Limits for tasks assessed                                        General  Comments      Exercises Other Exercises Other Exercises: strength was WFL BLE's x DF 4+   Assessment/Plan    PT Assessment Patient needs continued PT services  PT Problem List Decreased range of motion;Decreased strength;Decreased activity tolerance;Decreased balance;Decreased mobility;Decreased coordination;Decreased knowledge of use of DME;Decreased safety awareness;Decreased knowledge of precautions;Cardiopulmonary status limiting activity;Obesity       PT Treatment Interventions DME instruction;Gait  training;Stair training;Functional mobility training;Therapeutic activities;Therapeutic exercise;Balance training;Neuromuscular re-education;Patient/family education    PT Goals (Current goals can be found in the Care Plan section)  Acute Rehab PT Goals Patient Stated Goal: to get stronger and get home PT Goal Formulation: With patient Time For Goal Achievement: 12/13/16 Potential to Achieve Goals: Good    Frequency Min 2X/week   Barriers to discharge Decreased caregiver support      Co-evaluation               AM-PAC PT "6 Clicks" Daily Activity  Outcome Measure Difficulty turning over in bed (including adjusting bedclothes, sheets and blankets)?: A Little Difficulty moving from lying on back to sitting on the side of the bed? : A Little Difficulty sitting down on and standing up from a chair with arms (e.g., wheelchair, bedside commode, etc,.)?: Total Help needed moving to and from a bed to chair (including a wheelchair)?: A Little Help needed walking in hospital room?: A Little Help needed climbing 3-5 steps with a railing? : A Little 6 Click Score: 16    End of Session Equipment Utilized During Treatment: Gait belt Activity Tolerance: Patient tolerated treatment well;Patient limited by fatigue;Treatment limited secondary to medical complications (Comment) (SOB with reduction in O2 sat and pulse elevated) Patient left: in bed;with call bell/phone within reach;with bed alarm set Nurse Communication: Mobility status PT Visit Diagnosis: Unsteadiness on feet (R26.81);Muscle weakness (generalized) (M62.81);Difficulty in walking, not elsewhere classified (R26.2)    Time: 0037-0488 PT Time Calculation (min) (ACUTE ONLY): 22 min   Charges:   PT Evaluation $PT Eval Moderate Complexity: 1 Mod     PT G Codes:   PT G-Codes **NOT FOR INPATIENT CLASS** Functional Assessment Tool Used: AM-PAC 6 Clicks Basic Mobility  6:01 PM, 11/29/16 Mee Hives, PT, MS Physical Therapist -  [place] [phone] (Office)    Ramond Dial 11/29/2016, 5:58 PM

## 2016-11-29 NOTE — ED Provider Notes (Signed)
[facility] DEPT Provider Note   CSN: [id number] Arrival date & time: 11/29/16  0841     History   Chief Complaint Chief Complaint  Patient presents with  . Shortness of Breath    HPI Grant Moran is a 61 y.o. male.  Patient is a 61 year old male who presents to the emergency department with complaint of increasing shortness of breath.  The patient states this problem started on Saturday, August 4. He states that he felt as though he was moving slowly. He felt like he was "heavy". He states that he has been having problems with cough, and having to set up the chair to assist with his breathing. He has not noted any swelling of his hands or feet. No swelling of the abdomen. He denies chest pain. No loss of consciousness, or no sensation of near loss of consciousness.qq He does complain of some right lower rib area pain. He states that he is bringing up some phlegm, but has not had any hemoptysis or to be reported. There's been no recent temperature elevations. His been no recent injury to the chest area. He has had one episode where he has coughed until he had some vomiting, but no other vomiting reported. The patient reports he has had "2 heart attacks". He is a former smoker, but states he does not smoke at this time. He is not been seen by a cardiologist in over a year. He presents now for assistance with this issue.      Past Medical History:  Diagnosis Date  . CAD (coronary artery disease)   . Diabetes mellitus ([place])   . Hyperlipidemia   . Hypertension   . Myocardial infarction ([place])    OCT 2001-He under went angioplasty  and  stenting  of the RCA and distal RCA   . Stroke [facility])     Patient Active Problem List   Diagnosis Date Noted  . Cerebrovascular accident (CVA) due to embolism of cerebral artery ([place]) 08/01/2015  . Type 2 diabetes mellitus with circulatory disorder ([place]) 08/01/2015  . Essential hypertension 08/01/2015  . Diabetic mononeuropathy associated with  type 2 diabetes mellitus ([place]) 06/03/2015  . 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 HLD (hyperlipidemia) 06/03/2015  . Stroke ([place])   . 6th nerve palsy   . CVA (cerebral infarction) 05/01/2015  . Diabetes ([place]) 05/01/2015  . Hypertension 05/01/2015  . CAD (coronary artery disease) 11/16/2011    Past Surgical History:  Procedure Laterality Date  . ANKLE SURGERY     Trauma  . fx left arm    . stent sx    . TEE WITHOUT CARDIOVERSION N/A 05/07/2015   Procedure: TRANSESOPHAGEAL ECHOCARDIOGRAM (TEE);  Surgeon: Thayer Headings, MD;  Location: [place];  Service: Cardiovascular;  Laterality: N/A;       Home Medications    Prior to Admission medications   Medication Sig Start Date End Date Taking? Authorizing Provider  atorvastatin (LIPITOR) 20 MG tablet Take 1 tablet (20 mg total) by mouth every morning. 06/03/15   Rosalin Hawking, MD  clopidogrel (PLAVIX) 75 MG tablet Take 1 tablet (75 mg total) by mouth daily. 06/03/15   Rosalin Hawking, MD  fenofibrate 160 MG tablet Take 160 mg by mouth daily. 04/02/15 04/01/16  [provider]  glyBURIDE-metformin (GLUCOVANCE) 5-500 MG tablet Take 1 tablet by mouth 2 (two) times daily.    [provider]  INVOKANA 300 MG TABS tablet Take 300 mg by mouth daily. 04/16/15   [provider]  losartan (COZAAR) 50 MG tablet Take 1 tablet (50 mg total) by mouth daily. 05/03/15   Thurnell Lose, MD  metoprolol succinate (TOPROL-XL) 100 MG 24 hr tablet Take 100 mg by mouth daily. Take with or immediately following a meal.    [provider]  nitroGLYCERIN (NITROSTAT) 0.4 MG SL tablet Place 0.4 mg under the tongue every 5 (five) minutes as needed for chest pain.     [provider]  sitaGLIPtin (JANUVIA) 100 MG tablet Take by mouth. 05/30/15 05/29/16  [provider]    Family History Family History  Problem Relation Age of Onset  . Cancer Mother        Colon  . Heart attack Father        CABG  . Heart attack Sister   .  Heart attack Maternal Grandfather   . Heart attack Paternal Grandfather     Social History Social History  Substance Use Topics  . Smoking status: Former Smoker    Packs/day: 2.00    Years: 20.00    Types: Cigarettes    Quit date: 06/18/1990  . Smokeless tobacco: Never Used  . Alcohol use 0.6 oz/week    1 Shots of liquor per week     Comment: occasioinally     Allergies   Patient has no known allergies.   Review of Systems Review of Systems  Constitutional: Negative for activity change and fever.       All ROS Neg except as noted in HPI  HENT: Positive for congestion. Negative for nosebleeds.   Eyes: Negative for photophobia and discharge.  Respiratory: Positive for cough and shortness of breath. Negative for wheezing.   Cardiovascular: Negative for chest pain, palpitations and leg swelling.  Gastrointestinal: Negative for blood in stool.       Right rib/abd pain  Genitourinary: Negative for dysuria, frequency and hematuria.  Musculoskeletal: Negative for arthralgias, back pain and neck pain.  Skin: Negative.  Negative for rash.  Neurological: Negative for dizziness, seizures and speech difficulty.  Psychiatric/Behavioral: Negative for confusion and hallucinations.     Physical Exam Updated Vital Signs Pulse 100   Temp 97.8 F (36.6 C) (Oral)   Resp (!) 22   Ht 5\' 3"  (1.6 m)   Wt 72.6 kg (160 lb)   SpO2 95%   BMI 28.34 kg/m   Physical Exam  Constitutional: He is oriented to person, place, and time. He appears well-developed and well-nourished.  Non-toxic appearance.  HENT:  Head: Normocephalic.  Right Ear: Tympanic membrane and external ear normal.  Left Ear: Tympanic membrane and external ear normal.  Eyes: Pupils are equal, round, and reactive to light. EOM and lids are normal.  Neck: Normal range of motion. Neck supple. Carotid bruit is not present.  Cardiovascular: Normal rate, regular rhythm, normal heart sounds, intact distal pulses and normal pulses.    Pulmonary/Chest: Breath sounds normal. Tachypnea noted. No respiratory distress. He has no wheezes. He has no rhonchi. He has no rales.  Mild right lower rib area tenderness present. Pt has mild difficulty speaking in complete sentences.  Abdominal: Soft. Bowel sounds are normal. There is no tenderness. There is no guarding.  Musculoskeletal: Normal range of motion.  Lymphadenopathy:       Head (right side): No submandibular adenopathy present.       Head (left side): No submandibular adenopathy present.    He has no cervical adenopathy.  Neurological: He is alert and oriented to person, place, and time. He  has normal strength. No cranial nerve deficit or sensory deficit.  Skin: Skin is warm and dry.  Psychiatric: He has a normal mood and affect. His speech is normal.  Nursing note and vitals reviewed.    ED Treatments / Results  Labs (all labs ordered are listed, but only abnormal results are displayed) Labs Reviewed - No data to display  EKG  EKG Interpretation  Date/Time:  Monday November 29 2016 08:51:50 EDT Ventricular Rate:  97 PR Interval:    QRS Duration: 116 QT Interval:  351 QTC Calculation: 446 R Axis:   75 Text Interpretation:  Sinus rhythm Nonspecific intraventricular conduction delay Anteroseptal infarct, old Repol abnrm suggests ischemia, anterolateral Baseline wander in lead(s) V1 No significant change since last tracing Confirmed by Isla Pence [phone]) on 11/29/2016 9:00:09 AM       Radiology No results found.  Procedures Procedures (including critical care time)  Medications Ordered in ED Medications  albuterol (PROVENTIL) (2.5 MG/3ML) 0.083% nebulizer solution 5 mg (not administered)     Initial Impression / Assessment and Plan / ED Course  I have reviewed the triage vital signs and the nursing notes.  Pertinent labs & imaging results that were available during my care of the patient were reviewed by me and considered in my medical decision  making (see chart for details).       Final Clinical Impressions(s) / ED Diagnoses MDM Vital signs within normal limits. Pulse oximetry is 95% on 2 L of oxygen. Patient states he has some improvement but only minimal using the oxygen.  Recheck. Patient states that the oxygen is now seeming to help, he feels as though his breathing little easier.  The competence of metabolic panel shows a glucose to be elevated at 232, the potassium is slightly low at 3.4. The remainder of the competence of metabolic panel is within normal limits. The anion gap is 12.  The complete blood count shows a slight decrease in hemoglobin and hematocrit 12.8, and 37.3 respectively. Otherwise within normal limits. D-dimer is within normal limits at 0.28. The troponin is elevated at 0.49. The patient has been treated with aspirin earlier. The B natruretic peptide is elevated at 1413. Chest x-ray shows bronchitic changes present. However there is a patchy density about the right hilum that could be consistent with atelectasis and or bronchopneumonia.  The patient was treated with IV Rocephin and oral Zithromax for possible community-acquired pneumonia. A consultation has been called to [facility].  Case discussed with cardiology. It is their opinion that the patient can be treated at this facility. The patient will be seen by cardiology later this afternoon.    Final diagnoses:  Dyspnea, unspecified type  Community acquired pneumonia, unspecified laterality  Coronary artery disease involving native coronary artery of native heart without angina pectoris    New Prescriptions New Prescriptions   No medications on file     Annette Stable 11/30/16 Crissie Figures, MD 12/03/16 1610

## 2016-11-29 NOTE — ED Notes (Signed)
CRITICAL VALUE ALERT  Critical Value:  Troponin = 0.49  Date & Time Notied:  12/02/2016 1026  Provider Notified: Meryl Crutch, PA  Orders Received/Actions taken:

## 2016-11-29 NOTE — Consult Note (Signed)
Cardiology Consultation:   Patient ID: Grant Moran; [id number]; 03-11-1956   Admit date: 11/29/2016 Date of Consult: 11/29/2016  Primary Care Provider: System, Provider Not In Primary Cardiologist: [place] Primary Electrophysiologist:  None   Patient Profile:   Grant Moran is a 61 y.o. male with a hx of CAD, HTN, DM and CVA who is being seen today for the evaluation of CHF and elevated troponin  at the request of Dr Gilford Raid [facility].  History of Present Illness:   Grant Moran 61 y.o. with 2 days of dyspnea. Worse at night when he tries to sleep. Cough no fever or sputum. Weight is not up. No edema. No sick contacts Compliant with meds. In ER CXR with patchy density right hilum atelectasis or bronchopneumonia. BNP elevated 1413 Troponin .49 Cr .73 WBC 9.3 DM seems poorly controlled   Cardiac history dates back to 2001 Seen by Dr Percival Spanish, CM, and most recently Dr Acie Fredrickson 05/27/15 after a hospitalization for CVA. TEE no SOE EF 35-40% with inferior RWMA. IMI 2001 with ulcerated lesion in RCA received BMS. Had restenosis and Rx angioplasty and brachytherapy 04/21/2000. Had residual OM/PLA and proximal LAD disease 70%. No history of CHF. No chest pain. Currently in ER Non toxic comfortable Sats 98% Telemetry with NSR. Denies salt indiscretion but diet is poor.   Past Medical History:  Diagnosis Date  . CAD (coronary artery disease)   . Diabetes mellitus ([place])   . Hyperlipidemia   . Hypertension   . Myocardial infarction ([place])    OCT 2001-He under went angioplasty  and  stenting  of the RCA and distal RCA   . Stroke [facility])     Past Surgical History:  Procedure Laterality Date  . ANKLE SURGERY     Trauma  . fx left arm    . stent sx    . TEE WITHOUT CARDIOVERSION N/A 05/07/2015   Procedure: TRANSESOPHAGEAL ECHOCARDIOGRAM (TEE);  Surgeon: Thayer Headings, MD;  Location: [facility] ENDOSCOPY;  Service: Cardiovascular;  Laterality: N/A;     Inpatient Medications: Scheduled Meds: No current  facility-administered medications for this encounter.   Current Outpatient Prescriptions:  .  atorvastatin (LIPITOR) 80 MG tablet, Take 1 tablet by mouth daily., Disp: , Rfl: 3 .  calcium carbonate (TUMS - DOSED IN MG ELEMENTAL CALCIUM) 500 MG chewable tablet, Chew 2 tablets by mouth daily as needed for indigestion or heartburn., Disp: , Rfl:  .  clopidogrel (PLAVIX) 75 MG tablet, Take 1 tablet (75 mg total) by mouth daily., Disp: 90 tablet, Rfl: 3 .  fenofibrate 160 MG tablet, Take 160 mg by mouth daily., Disp: , Rfl:  .  glyBURIDE-metformin (GLUCOVANCE) 5-500 MG tablet, Take 1 tablet by mouth 2 (two) times daily., Disp: , Rfl:  .  INVOKANA 300 MG TABS tablet, Take 300 mg by mouth daily., Disp: , Rfl: 0 .  losartan (COZAAR) 50 MG tablet, Take 1 tablet (50 mg total) by mouth daily., Disp: 30 tablet, Rfl: 0 .  metoprolol succinate (TOPROL-XL) 100 MG 24 hr tablet, Take 100 mg by mouth daily. Take with or immediately following a meal., Disp: , Rfl:  .  nitroGLYCERIN (NITROSTAT) 0.4 MG SL tablet, Place 0.4 mg under the tongue every 5 (five) minutes as needed for chest pain. , Disp: , Rfl:  .  sitaGLIPtin (JANUVIA) 100 MG tablet, Take by mouth., Disp: , Rfl:   Continuous Infusions:  NOne   PRN Meds:   Allergies:   No Known Allergies  Social History:   Social History   Social History  . Marital status: Single    Spouse name: N/A  . Number of children: 0  . Years of education: N/A   Occupational History  . [employer]ile [employer] [place] History Main Topics  . Smoking status: Former Smoker    Packs/day: 2.00    Years: 20.00    Types: Cigarettes    Quit date: 06/18/1990  . Smokeless tobacco: Never Used  . Alcohol use 0.6 oz/week    1 Shots of liquor per week     Comment: occasioinally  . Drug use: No  . Sexual activity: Not on file   Other Topics Concern  . Not on file   Social History Narrative   Notable for remote tobacco abuse, with a prior three-pack per - day - history  For  20 years . He  Has not smoked since 1992.    Family History:   Family History  Problem Relation Age of Onset  . Cancer Mother        Colon  . Heart attack Father        CABG  . Heart attack Sister   . Heart attack Maternal Grandfather   . Heart attack Paternal Grandfather      ROS:  Please see the history of present illness.  ROS  All other ROS reviewed and negative.     Physical Exam/Data:   Vitals:   11/29/16 1045 11/29/16 1055 11/29/16 1100 11/29/16 1115  BP:  126/79 129/88   Pulse: 96  91 96  Resp: 13  20 (!) 23  Temp:      TempSrc:      SpO2: 98%  97% 99%  Weight:      Height:        Intake/Output Summary (Last 24 hours) at 11/29/16 1138 Last data filed at 11/29/16 1111  Gross per 24 hour  Intake               50 ml  Output                0 ml  Net               50 ml   Filed Weights   11/29/16 0849  Weight: 160 lb (72.6 kg)   Body mass index is 28.34 kg/m.  General:  Well nourished, well developed, in no acute distress  HEENT: normal Lymph: no adenopathy Neck: no JVD Endocrine:  No thryomegaly Vascular: No carotid bruits; FA pulses 2+ bilaterally without bruits  Cardiac:  normal S1, S2; RRR; no murmur  Lungs:  Crackles left base , no wheezing, rhonchi or rales  Abd: soft, nontender, no hepatomegaly  Ext: no edema Musculoskeletal:  No deformities, BUE and BLE strength normal and equal Skin: warm and dry  Neuro:  CNs 2-12 intact, no focal abnormalities noted Psych:  Normal affect   EKG:  The EKG was personally reviewed and demonstrates:  ST nonspecific ST changes IVCD  Telemetry:  Telemetry was personally reviewed and demonstrates:  NSR   Relevant CV Studies: TEE 2017 EF 35-40% no SOE   Laboratory Data:  Chemistry Recent Labs Lab 11/29/16 0936  NA 138  K 3.4*  CL 103  CO2 23  GLUCOSE 232*  BUN 12  CREATININE 0.73  CALCIUM 9.1  GFRNONAA >60  GFRAA >60  ANIONGAP 12     Recent Labs Lab 11/29/16 0936  PROT 6.8  ALBUMIN 3.7  AST 21  ALT 16*  ALKPHOS 70  BILITOT 0.9   Hematology Recent Labs Lab 11/29/16 0936  WBC 9.2  RBC 4.35  HGB 12.8*  HCT 37.3*  MCV 85.7  MCH 29.4  MCHC 34.3  RDW 13.7  PLT 263   Cardiac Enzymes Recent Labs Lab 11/29/16 0936  TROPONINI 0.49*   No results for input(s): TROPIPOC in the last 168 hours.  BNP Recent Labs Lab 11/29/16 0936  BNP 1,413.0*    DDimer  Recent Labs Lab 11/29/16 0936  DDIMER 0.28    Radiology/Studies:  Dg Chest 2 View  Result Date: 11/29/2016 CLINICAL DATA:  Shortness of breath EXAM: CHEST  2 VIEW COMPARISON:  05/01/2015 FINDINGS: Mildly low lung volumes interstitial coarsening. There is subtly asymmetric reticular opacity about the right hila. No edema, effusion, or pneumothorax. Normal heart size. Stable mediastinal contours. Coronary stenting. IMPRESSION: Bronchitic type markings. Patchy reticular density about the right hilum could be atelectasis or bronchopneumonia. Electronically Signed   By: Monte Fantasia M.D.   On: 11/29/2016 09:31    Assessment and Plan:   1. CHF:  Start lasix Continue ACE. Change beta blocker to coreg. Echo to reassess EF F/U PA/Lateral CXR in am . Low sodium diet 2. Troponin:  Given DM trend enzymes if rises more may need right and left cath given known residual disease and poorly controlled DM. Dyspnea may be anginal equivalent. Heparin for now 3. DM:  Discussed low carb diet.  Target hemoglobin A1c is 6.5 or less.  Continue current medications.  4. Pneumonia:  On zithromax and ceftriaxone albuterol in ER f/u CXR does not appear toxic    Signed, Jenkins Rouge, MD  11/29/2016 11:38 AM

## 2016-11-29 NOTE — ED Notes (Signed)
Pt. Stable and ready for transport to [facility]. Report given to Jessica Priest, RN.

## 2016-11-29 NOTE — ED Notes (Signed)
Troponin 0.49. EDP notified.

## 2016-11-29 NOTE — ED Triage Notes (Signed)
Patient complains of shortness of breath that started yesterday. Toren also states upper abdominal pain and nausea with 4 instances of vomiting last night.

## 2016-11-29 NOTE — Progress Notes (Signed)
ANTICOAGULATION CONSULT NOTE - Initial Consult  Pharmacy Consult for heparin Indication: chest pain/ACS  No Known Allergies  Patient Measurements: Height: 5\' 3"  (160 cm) Weight: 169 lb 11.2 oz (77 kg) IBW/kg (Calculated) : 56.9 Heparin Dosing Weight: 73 kg  Vital Signs: Temp: 97.8 F (36.6 C) (08/06 1329) Temp Source: Oral (08/06 1329) BP: 119/77 (08/06 1329) Pulse Rate: 87 (08/06 1329)  Labs:  Recent Labs  11/29/16 0936  HGB 12.8*  HCT 37.3*  PLT 263  CREATININE 0.73  TROPONINI 0.49*    Estimated Creatinine Clearance: 89 mL/min (by C-G formula based on SCr of 0.73 mg/dL).   Medical History: Past Medical History:  Diagnosis Date  . CAD (coronary artery disease)   . Diabetes mellitus ([place])   . Hyperlipidemia   . Hypertension   . Myocardial infarction ([place])    OCT 2001-He under went angioplasty  and  stenting  of the RCA and distal RCA   . Stroke [facility])     Medications:  Prescriptions Prior to Admission  Medication Sig Dispense Refill Last Dose  . atorvastatin (LIPITOR) 80 MG tablet Take 1 tablet by mouth daily.  3 11/29/2016 at Unknown time  . calcium carbonate (TUMS - DOSED IN MG ELEMENTAL CALCIUM) 500 MG chewable tablet Chew 2 tablets by mouth daily as needed for indigestion or heartburn.   unknown  . clopidogrel (PLAVIX) 75 MG tablet Take 1 tablet (75 mg total) by mouth daily. 90 tablet 3 11/29/2016 at Unknown time  . fenofibrate 160 MG tablet Take 160 mg by mouth daily.   11/29/2016 at Unknown time  . glyBURIDE-metformin (GLUCOVANCE) 5-500 MG tablet Take 1 tablet by mouth 2 (two) times daily.   11/29/2016 at Unknown time  . INVOKANA 300 MG TABS tablet Take 300 mg by mouth daily.  0 11/29/2016 at Unknown time  . losartan (COZAAR) 50 MG tablet Take 1 tablet (50 mg total) by mouth daily. 30 tablet 0 11/29/2016 at Unknown time  . metoprolol succinate (TOPROL-XL) 100 MG 24 hr tablet Take 100 mg by mouth daily. Take with or immediately following a meal.   11/29/2016 at 0430  .  nitroGLYCERIN (NITROSTAT) 0.4 MG SL tablet Place 0.4 mg under the tongue every 5 (five) minutes as needed for chest pain.    unknown  . sitaGLIPtin (JANUVIA) 100 MG tablet Take by mouth.   11/29/2016 at Unknown time    Assessment: 61 yo man to start heparin for r/o ACS.  Troponin 0.49, Hg 12.8.  He was not on anticoagulation PTA Goal of Therapy:  Heparin level 0.3-0.7 units/ml Monitor platelets by anticoagulation protocol: Yes   Plan:  Heparin 3500 unit bolus and drip at 1150 units/hr Check heparin level in 6-8 hours Daily HL and CBC while on heparin Monitor for adverse effects   Dayne Chait [place] 11/29/2016,4:29 PM

## 2016-11-30 ENCOUNTER — Inpatient Hospital Stay (HOSPITAL_COMMUNITY): Payer: BLUE CROSS/BLUE SHIELD

## 2016-11-30 DIAGNOSIS — I5023 Acute on chronic systolic (congestive) heart failure: Secondary | ICD-10-CM

## 2016-11-30 DIAGNOSIS — I340 Nonrheumatic mitral (valve) insufficiency: Secondary | ICD-10-CM

## 2016-11-30 DIAGNOSIS — J181 Lobar pneumonia, unspecified organism: Secondary | ICD-10-CM

## 2016-11-30 LAB — BASIC METABOLIC PANEL
Anion gap: 10 (ref 5–15)
BUN: 13 mg/dL (ref 6–20)
CHLORIDE: 102 mmol/L (ref 101–111)
CO2: 23 mmol/L (ref 22–32)
CREATININE: 0.64 mg/dL (ref 0.61–1.24)
Calcium: 8.1 mg/dL — ABNORMAL LOW (ref 8.9–10.3)
GFR calc Af Amer: >60 mL/min (ref >60)
GFR calc non Af Amer: >60 mL/min (ref >60)
GLUCOSE: 209 mg/dL — AB (ref 65–99)
Potassium: 3.6 mmol/L (ref 3.5–5.1)
SODIUM: 135 mmol/L (ref 135–145)

## 2016-11-30 LAB — PROTIME-INR
INR: 1.08
PROTHROMBIN TIME: 14.1 s (ref 11.4–15.2)

## 2016-11-30 LAB — GLUCOSE, CAPILLARY
GLUCOSE-CAPILLARY: 222 mg/dL — AB (ref 65–99)
GLUCOSE-CAPILLARY: 369 mg/dL — AB (ref 65–99)
Glucose-Capillary: 244 mg/dL — ABNORMAL HIGH (ref 65–99)
Glucose-Capillary: 287 mg/dL — ABNORMAL HIGH (ref 65–99)

## 2016-11-30 LAB — CBC WITH DIFFERENTIAL/PLATELET
Basophils Absolute: 0 10*3/uL (ref 0.0–0.1)
Basophils Relative: 0 %
EOS ABS: 0.3 10*3/uL (ref 0.0–0.7)
EOS PCT: 4 %
HCT: 36.8 % — ABNORMAL LOW (ref 39.0–52.0)
Hemoglobin: 12.5 g/dL — ABNORMAL LOW (ref 13.0–17.0)
LYMPHS ABS: 2 10*3/uL (ref 0.7–4.0)
LYMPHS PCT: 26 %
MCH: 29.3 pg (ref 26.0–34.0)
MCHC: 34 g/dL (ref 30.0–36.0)
MCV: 86.4 fL (ref 78.0–100.0)
MONOS PCT: 10 %
Monocytes Absolute: 0.7 10*3/uL (ref 0.1–1.0)
Neutro Abs: 4.7 10*3/uL (ref 1.7–7.7)
Neutrophils Relative %: 60 %
PLATELETS: 231 10*3/uL (ref 150–400)
RBC: 4.26 MIL/uL (ref 4.22–5.81)
RDW: 13.7 % (ref 11.5–15.5)
WBC: 7.7 10*3/uL (ref 4.0–10.5)

## 2016-11-30 LAB — LIPID PANEL
Cholesterol: 47 mg/dL (ref 0–200)
HDL: 21 mg/dL — AB (ref >40)
LDL Cholesterol: 11 mg/dL (ref 0–99)
Total CHOL/HDL Ratio: 2.2 RATIO
Triglycerides: 75 mg/dL (ref <150)
VLDL: 15 mg/dL (ref 0–40)

## 2016-11-30 LAB — TROPONIN I: Troponin I: 0.5 ng/mL (ref <0.03)

## 2016-11-30 LAB — HEMOGLOBIN A1C
HEMOGLOBIN A1C: 12.2 % — AB (ref 4.8–5.6)
MEAN PLASMA GLUCOSE: 303 mg/dL

## 2016-11-30 LAB — HIV ANTIBODY (ROUTINE TESTING W REFLEX): HIV SCREEN 4TH GENERATION: NONREACTIVE

## 2016-11-30 LAB — HEPARIN LEVEL (UNFRACTIONATED)
Heparin Unfractionated: 0.28 IU/mL — ABNORMAL LOW (ref 0.30–0.70)
Heparin Unfractionated: 0.32 IU/mL (ref 0.30–0.70)

## 2016-11-30 LAB — ECHOCARDIOGRAM COMPLETE
Height: 63 in
Weight: 2728 oz

## 2016-11-30 LAB — STREP PNEUMONIAE URINARY ANTIGEN: STREP PNEUMO URINARY ANTIGEN: NEGATIVE

## 2016-11-30 LAB — MAGNESIUM: Magnesium: 2.1 mg/dL (ref 1.7–2.4)

## 2016-11-30 LAB — BRAIN NATRIURETIC PEPTIDE: B NATRIURETIC PEPTIDE 5: 389 pg/mL — AB (ref 0.0–100.0)

## 2016-11-30 MED ORDER — INSULIN GLARGINE 100 UNIT/ML ~~LOC~~ SOLN
14.0000 [IU] | Freq: Every day | SUBCUTANEOUS | Status: DC
  Administered 2016-11-30: 14 [IU] via SUBCUTANEOUS
  Filled 2016-11-30 (×2): qty 0.14

## 2016-11-30 MED ORDER — INSULIN STARTER KIT- PEN NEEDLES (ENGLISH)
1.0000 | Freq: Once | Status: DC
  Filled 2016-11-30: qty 1

## 2016-11-30 MED ORDER — IPRATROPIUM-ALBUTEROL 0.5-2.5 (3) MG/3ML IN SOLN
3.0000 mL | Freq: Three times a day (TID) | RESPIRATORY_TRACT | Status: DC
  Administered 2016-12-01 – 2016-12-03 (×6): 3 mL via RESPIRATORY_TRACT
  Filled 2016-11-30 (×7): qty 3

## 2016-11-30 MED ORDER — IPRATROPIUM-ALBUTEROL 0.5-2.5 (3) MG/3ML IN SOLN
3.0000 mL | Freq: Four times a day (QID) | RESPIRATORY_TRACT | Status: DC
  Administered 2016-11-30 (×3): 3 mL via RESPIRATORY_TRACT
  Filled 2016-11-30 (×3): qty 3

## 2016-11-30 MED ORDER — FUROSEMIDE 10 MG/ML IJ SOLN
80.0000 mg | Freq: Two times a day (BID) | INTRAMUSCULAR | Status: DC
  Administered 2016-11-30 – 2016-12-01 (×3): 80 mg via INTRAVENOUS
  Filled 2016-11-30 (×3): qty 8

## 2016-11-30 MED ORDER — IPRATROPIUM-ALBUTEROL 0.5-2.5 (3) MG/3ML IN SOLN: 3.0000 mL | RESPIRATORY_TRACT | Status: DC | PRN

## 2016-11-30 MED ORDER — INSULIN GLARGINE 100 UNIT/ML ~~LOC~~ SOLN
5.0000 [IU] | Freq: Once | SUBCUTANEOUS | Status: AC
  Administered 2016-11-30: 5 [IU] via SUBCUTANEOUS
  Filled 2016-11-30: qty 0.05

## 2016-11-30 MED ORDER — HEPARIN BOLUS VIA INFUSION
1100.0000 [IU] | Freq: Once | INTRAVENOUS | Status: AC
  Administered 2016-11-30: 1100 [IU] via INTRAVENOUS
  Filled 2016-11-30: qty 1100

## 2016-11-30 MED ORDER — GUAIFENESIN ER 600 MG PO TB12
1200.0000 mg | ORAL_TABLET | Freq: Two times a day (BID) | ORAL | Status: DC
  Administered 2016-11-30 – 2016-12-06 (×14): 1200 mg via ORAL
  Filled 2016-11-30 (×14): qty 2

## 2016-11-30 MED ORDER — LIVING WELL WITH DIABETES BOOK
Freq: Once | Status: AC
  Administered 2016-11-30: 11:00:00
  Filled 2016-11-30: qty 1

## 2016-11-30 MED ORDER — INSULIN GLARGINE 100 UNIT/ML ~~LOC~~ SOLN
10.0000 [IU] | Freq: Every day | SUBCUTANEOUS | Status: DC
  Filled 2016-11-30: qty 0.1

## 2016-11-30 NOTE — Progress Notes (Addendum)
Inpatient Diabetes Program Recommendations  AACE/ADA: New Consensus Statement on Inpatient Glycemic Control (2015)  Target Ranges:  Prepandial:   less than 140 mg/dL      Peak postprandial:   less than 180 mg/dL (1-2 hours)      Critically ill patients:  140 - 180 mg/dL  Results for Grant Moran, Grant Moran (MRN [id number]) as of 11/30/2016 10:48  Ref. Range 11/29/2016 16:02 11/29/2016 20:23 11/30/2016 07:57  Glucose-Capillary Latest Ref Range: 65 - 99 mg/dL 252 (H) 244 (H) 222 (H)   Results for Grant Moran, Grant Moran (MRN [id number]) as of 11/30/2016 10:48  Ref. Range 05/03/2015 10:50 11/29/2016 16:00  Hemoglobin A1C Latest Ref Range: 4.8 - 5.6 % 12.2 (H) 12.2 (H)   Review of Glycemic Control  Diabetes history: DM2 Outpatient Diabetes medications: Glucovance 5-500 mg BID, Invokana 300 mg daily, Januvia 100 mg daily Current orders for Inpatient glycemic control: Lantus 10 units QHS, Novolog 0-15 units TID with meals, Novolog 0-5 units QHS  Inpatient Diabetes Program Recommendations: HgbA1C: A1C 12.2% on 11/29/16 indicating an average glucose of 303 mg/dl over the past 2-3 months. Patient needs to follow up with PCP or Endocrinologist regarding improving DM control.  Insulin-Basal: Please consider increasing Lantus to 20 units QHS. Insulin-Meal Coverage: If Lantus is increased as recommended and post prandial glucose continues to be >180 mg/dl, please consider ordering Novolog 5 units TID with meals for meal coverage if patient eats at least 50% of meals.  NOTE: In reviewing chart, noted in Care Everywhere that patient seen PCP Dr. Edrick Oh on 07/20/16 and in office note it states that patient had not taken DM medications in over 6 months. Patient last seen PCP Dr. Edrick Oh on 09/03/16 and again office notes indicate patient had not been taking DM medications and patient was discharged from practice and advised to find a new PCP. A1C 12.2% on 11/29/16 which is unchanged from A1C of 12.2% on 05/03/15. Will plan to talk with patient today  regarding DM control.  Addendum 11/30/16@14 :15-Spoke with patient, his brother, and his nephew (with patient permission) about diabetes and home regimen for diabetes control. Patient reports that he is followed by Candi Leash, PA in [place], [place] for diabetes management. Patient confirms that he was discharged from Dr. Murrell Redden practice due to not taking medications as prescribed and missing appointments.  Currently patient takes Glucovance 5-500 mg BID, Invokana 300 mg daily, and Januvia 100 mg daily as an outpatient for diabetes control. Patient states that D. Baird Cancer, PA recently prescribed insulin for him but when he went to pick it up he was told it would cost him over $400 so he did not get it filled. Patient states that he has mail order prescription coverage and that he should be able to get insulin through the mail. Patient is not sure of the name of the insulin that was prescribed by D. Baird Cancer, [place].  Patient reports that he is taking all DM medications as prescribed. Patient states that he does not check his glucose because his glucometer is old and his test strips he has at home do not work.  Inquired about prior A1C and patient reports that he does not recall his last A1C value. Discussed A1C results (12.2% on 11/29/16) and explained that his current A1C indicates an average glucose of 303 mg/dl over the past 2-3 months. Discussed glucose and A1C goals. Discussed importance of checking CBGs and maintaining good CBG control to prevent long-term and short-term complications. Explained how hyperglycemia leads to damage within  blood vessels which lead to the common complications seen with uncontrolled diabetes. Stressed to the patient the importance of improving glycemic control to prevent further complications from uncontrolled diabetes. Discussed impact of nutrition, exercise, stress, sickness, and medications on diabetes control. Discussed carbohydrates, carbohydrate goals per day and meal, along with  portion sizes. Will order RD consult for further diet education.  Asked patient about willingness to use insulin and patient states that he is agreeable to using insulin as an outpatient. Explained that if he is discharged new to insulin, he will need to check his glucose 3-4 times per day and to be sure to follow up closely with PCP so that the glucose levels can be used to continue to adjust the insulin if needed. Encouraged patient to check his glucose 4 times per day (before meals and at bedtime) and to keep a log book of glucose readings and insulin taken which he will need to take to doctor appointments. Discussed Lantus and Novolog insulin in detail and how they are currently ordered. Discussed insulin administration with vial/syringe and insulin pens. Patient states that he would prefer to use insulin pens as an outpatient. Educated patient on insulin pen use at home. Informed patient than an insulin flexpen starter kit would be ordered and his RN will provide once it is received from pharmacy. Reviewed all steps of insulin pen including attachment of needle, 2-unit air shot, dialing up dose, giving injection, removing needle, disposal of sharps, storage of unused insulin, disposal of insulin etc. Patient able to provide successful return demonstration. Informed patient that bedside nursing will be asking him to give himself insulin injections with syringe to ensure he is using the proper technique. Encouraged patient to call his insurance company to see if Lantus and Novolog insulin would be covered (or is Levemir and Humalog preferred brands).  Encouraged patient to read Living Well with Diabetes booklet at bedside and the insulin starter kit when received.  Patient verbalized understanding of information discussed and he states that he has no further questions at this time related to diabetes.  MD to give patient Rxs for: glucometer, testing supplies, insulin pens, and insulin pen  needles.  Thanks, Barnie Alderman, RN, MSN, CDE Diabetes Coordinator Inpatient Diabetes Program [phone] (Team Pager from 8am to 5pm)

## 2016-11-30 NOTE — Progress Notes (Signed)
Progress Note  Patient Name: BUELL PARCEL Date of Encounter: 11/30/2016  Primary Cardiologist: Nahser  Subjective   Still with dyspnea but slept well  Inpatient Medications    Scheduled Meds: . aspirin EC  81 mg Oral Daily  . atorvastatin  80 mg Oral Daily  . carvedilol  12.5 mg Oral BID WC  . clopidogrel  75 mg Oral Daily  . fenofibrate  160 mg Oral Daily  . furosemide  20 mg Intravenous Daily  . insulin aspart  0-15 Units Subcutaneous TID WC  . insulin aspart  0-5 Units Subcutaneous QHS  . insulin glargine  5 Units Subcutaneous QHS  . losartan  50 mg Oral Daily  . pneumococcal 23 valent vaccine  0.5 mL Intramuscular Tomorrow-1000  . potassium chloride  20 mEq Oral BID  . sodium chloride flush  3 mL Intravenous Q12H   Continuous Infusions: . sodium chloride    . azithromycin    . cefTRIAXone (ROCEPHIN)  IV    . heparin 1,300 Units/hr (11/30/16 0157)   PRN Meds: sodium chloride, acetaminophen, calcium carbonate, nitroGLYCERIN, ondansetron (ZOFRAN) IV, sodium chloride flush   Vital Signs    Vitals:   11/29/16 1855 11/29/16 2100 11/30/16 0621 11/30/16 0624  BP:  102/64 (!) 131/38 122/80  Pulse:  85 84   Resp:  20 20   Temp:   97.6 F (36.4 C)   TempSrc:   Oral   SpO2: 92% 93% 94%   Weight:   170 lb 8 oz (77.3 kg)   Height:        Intake/Output Summary (Last 24 hours) at 11/30/16 0758 Last data filed at 11/30/16 0400  Gross per 24 hour  Intake           529.54 ml  Output              200 ml  Net           329.54 ml   Filed Weights   11/29/16 0849 11/29/16 1329 11/30/16 0621  Weight: 160 lb (72.6 kg) 169 lb 11.2 oz (77 kg) 170 lb 8 oz (77.3 kg)    Telemetry    NSR  - Personally Reviewed  ECG    SR no acute ST changes  - Personally Reviewed  Physical Exam   GEN: No acute distress.   Neck: No JVD Cardiac: RRR, no murmurs, rubs, or gallops.  Respiratory: bilateral rales mid way  GI: Soft, nontender, non-distended  MS: No edema; No  deformity. Neuro:  Nonfocal  Psych: Normal affect   Labs    Chemistry  Recent Labs Lab 11/29/16 0936 11/29/16 1337 11/30/16 0503  NA 138 137 135  K 3.4* 3.2* 3.6  CL 103 101 102  CO2 23 24 23   GLUCOSE 232* 179* 209*  BUN 12 11 13   CREATININE 0.73 0.67 0.64  CALCIUM 9.1 8.7* 8.1*  PROT 6.8  --   --   ALBUMIN 3.7  --   --   AST 21  --   --   ALT 16*  --   --   ALKPHOS 70  --   --   BILITOT 0.9  --   --   GFRNONAA >60 >60 >60  GFRAA >60 >60 >60  ANIONGAP 12 12 10      Hematology  Recent Labs Lab 11/29/16 0936 11/30/16 0503  WBC 9.2 7.7  RBC 4.35 4.26  HGB 12.8* 12.5*  HCT 37.3* 36.8*  MCV 85.7 86.4  MCH  29.4 29.3  MCHC 34.3 34.0  RDW 13.7 13.7  PLT 263 231    Cardiac Enzymes  Recent Labs Lab 11/29/16 0936 11/29/16 1337 11/29/16 1940 11/30/16 0024  TROPONINI 0.49* 0.55* 0.53* 0.50*   No results for input(s): TROPIPOC in the last 168 hours.   BNP  Recent Labs Lab 11/29/16 0936 11/30/16 0503  BNP 1,413.0* 389.0*     DDimer   Recent Labs Lab 11/29/16 0936  DDIMER 0.28     Radiology    Dg Chest 2 View  Result Date: 11/29/2016 CLINICAL DATA:  Shortness of breath EXAM: CHEST  2 VIEW COMPARISON:  05/01/2015 FINDINGS: Mildly low lung volumes interstitial coarsening. There is subtly asymmetric reticular opacity about the right hila. No edema, effusion, or pneumothorax. Normal heart size. Stable mediastinal contours. Coronary stenting. IMPRESSION: Bronchitic type markings. Patchy reticular density about the right hilum could be atelectasis or bronchopneumonia. Electronically Signed   By: Monte Fantasia M.D.   On: 11/29/2016 09:31    Cardiac Studies   Echo pending   Patient Profile     61 y.o. male history of CAD admitted with CHF and elevated troponin   Assessment & Plan    1) CHF EF 35-40% previously but no clinical CHF. Exam worse this am and weight up with poor diuresis Will increase iv lasix f/u echo if EF markedly worse will need  transfer to [facility] for right and left cath 2) Elevated troponin - flat curve ? From CHF no chest pain or acute ECG changes d/c heparin if EF unchanged 3) DM:  Discussed low carb diet.  Target hemoglobin A1c is 6.5 or less.  Continue current medications. 4) ? Pneumonia has f/u PA / Lateral CXR this am   Signed, Jenkins Rouge, MD  11/30/2016, 7:58 AM

## 2016-11-30 NOTE — Progress Notes (Signed)
ANTICOAGULATION CONSULT NOTE - Follow Up Consult  Pharmacy Consult for Heparin Indication: Chest pain/ACS No Known Allergies  Patient Measurements: Height: 5\' 3"  (160 cm) Weight: 170 lb 8 oz (77.3 kg) IBW/kg (Calculated) : 56.9 Heparin Dosing Weight: 73 kg  Vital Signs: Temp: 97.6 F (36.4 C) (08/07 0621) Temp Source: Oral (08/07 0621) BP: 122/80 (08/07 0624) Pulse Rate: 84 (08/07 0621)  Labs:  Recent Labs  11/29/16 0936 11/29/16 1337 11/29/16 1940 11/30/16 0024 11/30/16 0503 11/30/16 0957  HGB 12.8*  --   --   --  12.5*  --   HCT 37.3*  --   --   --  36.8*  --   PLT 263  --   --   --  231  --   LABPROT  --   --   --   --  14.1  --   INR  --   --   --   --  1.08  --   HEPARINUNFRC  --   --   --  0.28*  --  0.32  CREATININE 0.73 0.67  --   --  0.64  --   TROPONINI 0.49* 0.55* 0.53* 0.50*  --   --     Estimated Creatinine Clearance: 89.3 mL/min (by C-G formula based on SCr of 0.64 mg/dL).  Assessment: 61 yo male on IV heparin for r/o ACS. Heparin level at goal.  CBC stable, no bleeding noted.  ECHO pending, if EF markedly worse will need transfer to [facility] for right and left cath per Cards.  Goal of Therapy:  Heparin level goal 0.3-0.7 units/ml Monitor platelets by anticoagulation protocol: Yes  Plan:  Continue heparin infusion to 1300 units/ml Daily Heparin Level and CBC  Biagio Quint R 11/30/2016,10:38 AM

## 2016-11-30 NOTE — Care Management Note (Signed)
Case Management Note  Patient Details  Name: Grant Moran MRN: [id number] Date of Birth: 09-08-1955  Subjective/Objective:                  Pt admitted with acute CHF. Pt says he does not have CHF chronically. He lives alone, ind with ADL's. He is employed as Horticulturist, commercial. He has insurance and reports having PCP, thinks his name is Dr. [name] in [place] but this CM unable to locate MD with this name in [place]. Pt has cardiologist. Pt has scale but does not weigh himself daily. Pt reports having difficulty affording insulin prescribed by PCP but unsure name of insulin. He says it is supposed to be coming in the mail but has not arrived as of yet. Pt feels he needs no intervention to assist with obtaining medication. PT has recommended HH PT but pt is not homebound and works full time. Pt understands he is not HH candidate. Pt communicates no needs.   Action/Plan: CM will cont to follow. He may need cath, Echo result pending.   Expected Discharge Date:    12/03/2016              Expected Discharge Plan:  Home/Self Care  In-House Referral:  NA  Discharge planning Services  CM Consult  Post Acute Care Choice:  NA Choice offered to:  NA  Status of Service:  In process, will continue to follow  Sherald Barge, RN 11/30/2016, 2:16 PM

## 2016-11-30 NOTE — Progress Notes (Signed)
ANTICOAGULATION CONSULT NOTE - Follow Up Consult  Pharmacy Consult for Heparin Indication: Chest pain/ACS No Known Allergies  Patient Measurements: Height: 5\' 3"  (160 cm) Weight: 169 lb 11.2 oz (77 kg) IBW/kg (Calculated) : 56.9 Heparin Dosing Weight: 73 kg  Vital Signs: BP: 102/64 (08/06 2100) Pulse Rate: 85 (08/06 2100)  Labs:  Recent Labs  11/29/16 0936 11/29/16 1337 11/29/16 1940 11/30/16 0024  HGB 12.8*  --   --   --   HCT 37.3*  --   --   --   PLT 263  --   --   --   HEPARINUNFRC  --   --   --  0.28*  CREATININE 0.73 0.67  --   --   TROPONINI 0.49* 0.55* 0.53* 0.50*    Estimated Creatinine Clearance: 89 mL/min (by C-G formula based on SCr of 0.67 mg/dL).   Medications:  Heparin 1150 unit/hr infusion  Assessment: 61 yo male on IV heparin for r/o ACS. 6 hour heparin level resulted at 0.28 unit/ml, below therapeutic goal.  Goal of Therapy:  Heparin level goal 0.3-0.7 units/ml Monitor platelets by anticoagulation protocol: Yes  Plan:  Heparin bolus 1100 units Increase heparin infusion to 1300 units/ml Repeat heparin level in 6-8 hours   Norberto Sorenson 11/30/2016,2:02 AM

## 2016-11-30 NOTE — Progress Notes (Signed)
PROGRESS NOTE    Grant Moran  BVQ:[id number] DOB: [birth year] DOA: 11/29/2016 PCP: System, Provider Not In    Brief Narrative:  Patient is a 61 year old gentleman history of diabetes hyperlipidemia hypertension history of CVA, coronary artery disease prior 18 for 35-40% presented with worsening shortness of breath found to be in an acute CHF exacerbation, probable community acquired pneumonia with elevated troponins. Cardiology consulted.   Assessment & Plan:   Principal Problem:   Acute on chronic systolic CHF (congestive heart failure) (HCC) Active Problems:   Elevated troponin   CAP (community acquired pneumonia)   CAD (coronary artery disease)   Cerebral infarction ([place])   Diabetes ([place])   Hypertension   Diabetic mononeuropathy associated with type 2 diabetes mellitus ([place])   HLD (hyperlipidemia)   Type 2 diabetes mellitus with circulatory disorder ([place])   Essential hypertension   CHF (congestive heart failure) (HCC)   Hypokalemia  #1 acute on chronic systolic CHF exacerbation Questionable etiology. Concern for secondary to an acute coronary syndrome as patient noted to have elevated troponin. Patient with no significant clinical improvement. Patient with a poor urine output over the past 24 hours. 2-D echo pending. IV Lasix dose has been increased per cardiology due to poor urine output. Continue aspirin, Lipitor, Coreg, Plavix, fenofibrate, Cozaar. Per cardiology if EF of 2-D echo is worse patient may need to be transferred to [facility] for further evaluation for right and left heart cardiac catheterization.  #2 elevated troponin Concern for acute coronary syndrome. 2-D echo pending. Per cardiology if EF is worse patient will likely need a cardiac catheterization. Continue current medications of aspirin, Lipitor, Coreg, Plavix, fenofibrate, Cozaar. Per cardiology.  #3 poorly controlled diabetes mellitus 2 Hemoglobin A1c is 12.2. CBGs have ranged from 222-369.  Increase Lantus to 14 units daily. Continue sliding scale insulin.  #4 Probable aspiration pneumonia/healthcare associated pneumonia Patient currently afebrile. Patient still with some shortness of breath. Sputum Gram stain and culture pending. Patient with a normal white count. Repeat chest x-ray this morning concerning more for volume overload. Continue empiric IV Rocephin and IV azithromycin.  #5 hypokalemia Repleted.  #6 hyperlipidemia LDL of 11.    DVT prophylaxis: Heparin Code Status: Full Family Communication: Updated patient. No family at bedside. Disposition Plan: Pending cardiac workup per cardiology.   Consultants:   Cardiology: Dr. Johnsie Cancel 11/29/2016  Procedures:   2-D echo 11/30/2016  Chest x-ray 11/30/2016, 11/29/2016    Antimicrobials:   IV Rocephin 11/29/2016  IV azithromycin 11/29/2016   Subjective: Patient states no significant change or shortness of breath. Patient complaining of some right-sided chest pain. Tolerating current diet.  Objective: Vitals:   11/30/16 0936 11/30/16 1401 11/30/16 1458 11/30/16 1500  BP:  (!) 93/55    Pulse:  92    Resp:  18    Temp:  98 F (36.7 C)    TempSrc:  Oral    SpO2: (!) 87% 95% 91% 92%  Weight:      Height:        Intake/Output Summary (Last 24 hours) at 11/30/16 1714 Last data filed at 11/30/16 1010  Gross per 24 hour  Intake           839.54 ml  Output             1100 ml  Net          -260.46 ml   Filed Weights   11/29/16 0849 11/29/16 1329 11/30/16 0621  Weight: 72.6 kg (160 lb)  77 kg (169 lb 11.2 oz) 77.3 kg (170 lb 8 oz)    Examination:  General exam: Appears calm and comfortable  Respiratory system: Scattered crackles. Some coarse breath sounds. Respiratory effort normal. Cardiovascular system: S1 & S2 heard, RRR. No JVD, murmurs, rubs, gallops or clicks. No pedal edema. Gastrointestinal system: Abdomen is nondistended, soft and nontender. No organomegaly or masses felt. Normal bowel  sounds heard. Central nervous system: Alert and oriented. No focal neurological deficits. Extremities: Symmetric 5 x 5 power. Skin: No rashes, lesions or ulcers Psychiatry: Judgement and insight appear normal. Mood & affect appropriate.     Data Reviewed: I have personally reviewed following labs and imaging studies  CBC:  Recent Labs Lab 11/29/16 0936 11/30/16 0503  WBC 9.2 7.7  NEUTROABS 6.1 4.7  HGB 12.8* 12.5*  HCT 37.3* 36.8*  MCV 85.7 86.4  PLT 263 034   Basic Metabolic Panel:  Recent Labs Lab 11/29/16 0936 11/29/16 1337 11/30/16 0503  NA 138 137 135  K 3.4* 3.2* 3.6  CL 103 101 102  CO2 23 24 23   GLUCOSE 232* 179* 209*  BUN 12 11 13   CREATININE 0.73 0.67 0.64  CALCIUM 9.1 8.7* 8.1*  MG 1.5*  --  2.1   GFR: Estimated Creatinine Clearance: 89.3 mL/min (by C-G formula based on SCr of 0.64 mg/dL). Liver Function Tests:  Recent Labs Lab 11/29/16 0936  AST 21  ALT 16*  ALKPHOS 70  BILITOT 0.9  PROT 6.8  ALBUMIN 3.7   No results for input(s): LIPASE, AMYLASE in the last 168 hours. No results for input(s): AMMONIA in the last 168 hours. Coagulation Profile:  Recent Labs Lab 11/30/16 0503  INR 1.08   Cardiac Enzymes:  Recent Labs Lab 11/29/16 0936 11/29/16 1337 11/29/16 1940 11/30/16 0024  TROPONINI 0.49* 0.55* 0.53* 0.50*   BNP (last 3 results) No results for input(s): PROBNP in the last 8760 hours. HbA1C:  Recent Labs  11/29/16 1600  HGBA1C 12.2*   CBG:  Recent Labs Lab 11/29/16 1602 11/29/16 2023 11/30/16 0757 11/30/16 1149 11/30/16 1641  GLUCAP 252* 244* 222* 369* 244*   Lipid Profile:  Recent Labs  11/30/16 0503  CHOL 47  HDL 21*  LDLCALC 11  TRIG 75  CHOLHDL 2.2   Thyroid Function Tests:  Recent Labs  11/29/16 1626  TSH 3.158   Anemia Panel: No results for input(s): VITAMINB12, FOLATE, FERRITIN, TIBC, IRON, RETICCTPCT in the last 72 hours. Sepsis Labs: No results for input(s): PROCALCITON,  LATICACIDVEN in the last 168 hours.  No results found for this or any previous visit (from the past 240 hour(s)).       Radiology Studies: Dg Chest 2 View  Result Date: 11/30/2016 CLINICAL DATA:  Shortness of breath EXAM: CHEST  2 VIEW COMPARISON:  11/29/2016 FINDINGS: Progressive interstitial opacities with Kerley lines. Right perihilar opacity is less asymmetric appearing today. No effusion. Normal heart size. Spondylosis. No acute osseous finding. IMPRESSION: Mildly progressive interstitial opacities. More [employer] are seen, this may be edema instead of atypical infection. Electronically Signed   By: Monte Fantasia M.D.   On: 11/30/2016 09:39   Dg Chest 2 View  Result Date: 11/29/2016 CLINICAL DATA:  Shortness of breath EXAM: CHEST  2 VIEW COMPARISON:  05/01/2015 FINDINGS: Mildly low lung volumes interstitial coarsening. There is subtly asymmetric reticular opacity about the right hila. No edema, effusion, or pneumothorax. Normal heart size. Stable mediastinal contours. Coronary stenting. IMPRESSION: Bronchitic type markings. Patchy reticular density about the  right hilum could be atelectasis or bronchopneumonia. Electronically Signed   By: Monte Fantasia M.D.   On: 11/29/2016 09:31        Scheduled Meds: . aspirin EC  81 mg Oral Daily  . atorvastatin  80 mg Oral Daily  . carvedilol  12.5 mg Oral BID WC  . clopidogrel  75 mg Oral Daily  . fenofibrate  160 mg Oral Daily  . furosemide  80 mg Intravenous BID  . guaiFENesin  1,200 mg Oral BID  . insulin aspart  0-15 Units Subcutaneous TID WC  . insulin aspart  0-5 Units Subcutaneous QHS  . insulin glargine  10 Units Subcutaneous QHS  . insulin starter kit- pen needles  1 kit Other Once  . ipratropium-albuterol  3 mL Nebulization Q6H  . losartan  50 mg Oral Daily  . potassium chloride  20 mEq Oral BID  . sodium chloride flush  3 mL Intravenous Q12H   Continuous Infusions: . sodium chloride    . azithromycin Stopped  (11/30/16 1105)  . cefTRIAXone (ROCEPHIN)  IV Stopped (11/30/16 1030)  . heparin 1,300 Units/hr (11/30/16 1119)     LOS: 1 day    Time spent: 63 minutes    Van Seymore, MD [facility] Pager [phone]  If 7PM-7AM, please contact night-coverage [email or web address] Password [email or web address] 11/30/2016, 5:14 PM

## 2016-11-30 NOTE — Progress Notes (Signed)
**Note De-identified Avry Roedl Obfuscation** EKG complete and placed in patient chart 

## 2016-11-30 NOTE — Progress Notes (Signed)
*  PRELIMINARY RESULTS* Echocardiogram 2D Echocardiogram has been performed.  Grant Moran 11/30/2016, 11:59 AM

## 2016-12-01 LAB — GLUCOSE, CAPILLARY
GLUCOSE-CAPILLARY: 422 mg/dL — AB (ref 65–99)
GLUCOSE-CAPILLARY: 150 mg/dL — AB (ref 65–99)
GLUCOSE-CAPILLARY: 288 mg/dL — AB (ref 65–99)
Glucose-Capillary: 213 mg/dL — ABNORMAL HIGH (ref 65–99)

## 2016-12-01 LAB — BASIC METABOLIC PANEL
ANION GAP: 10 (ref 5–15)
BUN: 11 mg/dL (ref 6–20)
CHLORIDE: 101 mmol/L (ref 101–111)
CO2: 24 mmol/L (ref 22–32)
Calcium: 8.7 mg/dL — ABNORMAL LOW (ref 8.9–10.3)
Creatinine, Ser: 0.63 mg/dL (ref 0.61–1.24)
GFR calc Af Amer: >60 mL/min (ref >60)
GFR calc non Af Amer: >60 mL/min (ref >60)
Glucose, Bld: 216 mg/dL — ABNORMAL HIGH (ref 65–99)
POTASSIUM: 3.9 mmol/L (ref 3.5–5.1)
SODIUM: 135 mmol/L (ref 135–145)

## 2016-12-01 LAB — CBC
HEMATOCRIT: 37.5 % — AB (ref 39.0–52.0)
HEMOGLOBIN: 12.9 g/dL — AB (ref 13.0–17.0)
MCH: 29.5 pg (ref 26.0–34.0)
MCHC: 34.4 g/dL (ref 30.0–36.0)
MCV: 85.6 fL (ref 78.0–100.0)
Platelets: 232 10*3/uL (ref 150–400)
RBC: 4.38 MIL/uL (ref 4.22–5.81)
RDW: 13.6 % (ref 11.5–15.5)
WBC: 8.7 10*3/uL (ref 4.0–10.5)

## 2016-12-01 LAB — GLUCOSE, RANDOM: GLUCOSE: 439 mg/dL — AB (ref 65–99)

## 2016-12-01 LAB — HEPARIN LEVEL (UNFRACTIONATED): HEPARIN UNFRACTIONATED: 0.38 [IU]/mL (ref 0.30–0.70)

## 2016-12-01 MED ORDER — FUROSEMIDE 10 MG/ML IJ SOLN
80.0000 mg | Freq: Three times a day (TID) | INTRAMUSCULAR | Status: DC
  Administered 2016-12-01 – 2016-12-02 (×5): 80 mg via INTRAVENOUS
  Filled 2016-12-01 (×5): qty 8

## 2016-12-01 MED ORDER — INSULIN ASPART 100 UNIT/ML ~~LOC~~ SOLN
0.0000 [IU] | Freq: Three times a day (TID) | SUBCUTANEOUS | Status: DC
  Administered 2016-12-01: 3 [IU] via SUBCUTANEOUS
  Administered 2016-12-01: 20 [IU] via SUBCUTANEOUS
  Administered 2016-12-02: 11 [IU] via SUBCUTANEOUS
  Administered 2016-12-02: 4 [IU] via SUBCUTANEOUS
  Administered 2016-12-02: 7 [IU] via SUBCUTANEOUS
  Administered 2016-12-03 (×2): 15 [IU] via SUBCUTANEOUS
  Administered 2016-12-04: 7 [IU] via SUBCUTANEOUS
  Administered 2016-12-04: 4 [IU] via SUBCUTANEOUS
  Administered 2016-12-04: 7 [IU] via SUBCUTANEOUS
  Administered 2016-12-05: 15 [IU] via SUBCUTANEOUS
  Administered 2016-12-05 (×2): 11 [IU] via SUBCUTANEOUS
  Administered 2016-12-06: 7 [IU] via SUBCUTANEOUS
  Administered 2016-12-06: 20 [IU] via SUBCUTANEOUS
  Administered 2016-12-06: 7 [IU] via SUBCUTANEOUS

## 2016-12-01 MED ORDER — INSULIN GLARGINE 100 UNIT/ML ~~LOC~~ SOLN
20.0000 [IU] | Freq: Every day | SUBCUTANEOUS | Status: DC
  Administered 2016-12-01 – 2016-12-06 (×6): 20 [IU] via SUBCUTANEOUS
  Filled 2016-12-01 (×7): qty 0.2

## 2016-12-01 MED ORDER — INSULIN ASPART 100 UNIT/ML ~~LOC~~ SOLN
0.0000 [IU] | Freq: Every day | SUBCUTANEOUS | Status: DC
  Administered 2016-12-01: 3 [IU] via SUBCUTANEOUS
  Administered 2016-12-02: 4 [IU] via SUBCUTANEOUS
  Administered 2016-12-03: 2 [IU] via SUBCUTANEOUS
  Administered 2016-12-04: 4 [IU] via SUBCUTANEOUS
  Administered 2016-12-05: 3 [IU] via SUBCUTANEOUS
  Administered 2016-12-06: 4 [IU] via SUBCUTANEOUS

## 2016-12-01 MED ORDER — SACUBITRIL-VALSARTAN 49-51 MG PO TABS
1.0000 | ORAL_TABLET | Freq: Two times a day (BID) | ORAL | Status: DC
  Administered 2016-12-02 (×2): 1 via ORAL
  Filled 2016-12-01 (×4): qty 1

## 2016-12-01 NOTE — Progress Notes (Signed)
Inpatient Diabetes Program Recommendations  AACE/ADA: New Consensus Statement on Inpatient Glycemic Control (2015)  Target Ranges:  Prepandial:   less than 140 mg/dL      Peak postprandial:   less than 180 mg/dL (1-2 hours)      Critically ill patients:  140 - 180 mg/dL   Results for DEAARON, FULGHUM (MRN [id number]) as of 12/01/2016 08:26  Ref. Range 11/30/2016 07:57 11/30/2016 11:49 11/30/2016 16:41 11/30/2016 21:02 12/01/2016 08:20  Glucose-Capillary Latest Ref Range: 65 - 99 mg/dL 222 (H) 369 (H) 244 (H) 287 (H) 213 (H)   Review of Glycemic Control  Outpatient Diabetes medications: Glucovance 5-500 mg BID, Invokana 300 mg daily, Januvia 100 mg daily Current orders for Inpatient glycemic control: Lantus 14 units QHS, Novolog 0-15 units TID with meals, Novolog 0-5 units QHS   Inpatient Diabetes Program Recommendations: Insulin - Basal: Patient received Lantus 5 units at 10:00 and Lantus 14 units at 22:29 on 11/30/16 (total of 19 units). Please consider increasing Lantus to 20 units QHS. Insulin - Meal Coverage: If post prandial glucose continues to be elevated, please consider ordering Novolog meal coverage. HgbA1C: A1C 12.2% on 11/29/16 indicating an average glucose of 303 mg/dl over the past 2-3 months. Patient needs to follow up with PCP or Endocrinologist regarding improving DM control.  NOTE: Spoke with patient on 11/30/16 regarding DM control and using insulin as an outpatient. Patient prefers insulin pens. Diabetes Coordinator educated patient on insulin pen administration.  Ordered insulin starter kit and patient education by bedside RNs to continue educating patient with each interaction. Patient will need Rx for: glucometer, testing supplies, insulin pen(s), and insulin pen needles at time of discharge.  Thanks, Grant Alderman, RN, MSN, CDE Diabetes Coordinator Inpatient Diabetes Program [phone] (Team Pager from 8am to 5pm)

## 2016-12-01 NOTE — Progress Notes (Signed)
**Note De-identified Grant Moran Obfuscation** EKG completed and placed in patient chart 

## 2016-12-01 NOTE — Progress Notes (Signed)
PROGRESS NOTE    Grant Moran  SJG:[id number] DOB: [birth year] DOA: 11/29/2016 PCP: System, Provider Not In    Brief Narrative:  Patient is a 61 year old man with a history of DM-2, CAD, cardiomyopathy with an EF of 35-40% per previous echo, HTN, and hyperlipidemia, who presented to the ED on 11/29/16 with a complaint of shortness of breath. In the ED, his BNP was 1413 and his first troponin I was elevated at 0.49. His chest x-ray revealed bronchitic type changes, patchy reticular density about the right hilum which could be atelectasis or bronchopneumonia. He was admitted for further evaluation and management.    Assessment & Plan:   Principal Problem:   Acute on chronic systolic CHF (congestive heart failure) (HCC) Active Problems:   CAD (coronary artery disease)   Cerebral infarction (HCC)   Diabetes (HCC)   Hypertension   Diabetic mononeuropathy associated with type 2 diabetes mellitus (HCC)   HLD (hyperlipidemia)   Type 2 diabetes mellitus with circulatory disorder (HCC)   Essential hypertension   CHF (congestive heart failure) (HCC)   CAP (community acquired pneumonia)   Elevated troponin   Hypokalemia   Patient is a 61 year old man with chronic systolic congestive heart failure, CAD, diabetes mellitus, and hyperlipidemia, who presented with shortness of breath. He was found to have an elevated troponin I, acute on chronic systolic congestive heart failure, and possible community-acquired pneumonia. Follow-up 2-D echocardiogram revealed an ejection fraction of 30%, worse than the previous 2-D echo. Patient was started on IV Lasix, carvedilol, Plavix, Lipitor, and Entresto by cardiology. Due to the worsening EF, cardiology plans transfer to [facility] for a cardiac catheterization on 12/02/16.  -Continue medications as above. -Continue Rocephin and azithromycin for total of 5 days before discontinuing. -Continue to manage diabetes which is uncontrolled. Sliding scale NovoLog was  increased to the resistant scale and Lantus was increased to 20 units. Patient would likely benefit from long-term insulin for treatment of uncontrolled diabetes. (Hemoglobin A1c was 12.2).      DVT prophylaxis: Heparin Code Status: Full code Family Communication: Family not available Disposition Plan: Pending transfer to [place] for cardiac catheterization 12/02/16.   Consultants:   Cardiology  Procedures:  2-D echo 11/30/2016: Study Conclusions  - Left ventricle: Diffuse hypokinesis worse in the inferior wall   and septum The cavity size was moderately dilated. Wall thickness   was increased in a pattern of moderate LVH. The estimated   ejection fraction was 30%. Left ventricular diastolic function   parameters were normal. - Mitral valve: There was mild regurgitation. - Left atrium: The atrium was moderately dilated. - Atrial septum: No defect or patent foramen ovale was identified. - Pulmonary arteries: PA peak pressure: 40 mm Hg (S)  Antimicrobials:   IV Rocephin 11/29/2016  IV azithromycin 11/29/2016   Subjective: Patient denies chest pain and shortness of breath currently. (Delayed entry).  Objective: Vitals:   12/01/16 0749 12/01/16 0854 12/01/16 1357 12/01/16 1500  BP:  125/70  120/75  Pulse:  78  87  Resp:    18  Temp:    98.4 F (36.9 C)  TempSrc:    Oral  SpO2: 97%  96% 94%  Weight:      Height:        Intake/Output Summary (Last 24 hours) at 12/01/16 1930 Last data filed at 12/01/16 1700  Gross per 24 hour  Intake             1081 ml  Output  2150 ml  Net            -1069 ml   Filed Weights   11/29/16 1329 11/30/16 0621 12/01/16 0441  Weight: 77 kg (169 lb 11.2 oz) 77.3 kg (170 lb 8 oz) 76.4 kg (168 lb 8 oz)    Examination:  General exam: Appears calm and comfortable  Respiratory system: Scattered crackles. Some coarse breath sounds. Respiratory effort normal. Cardiovascular system: S1 & S2 heard, RRR. No JVD, murmurs, rubs,  gallops or clicks. No pedal edema. Gastrointestinal system: Abdomen is nondistended, soft and nontender. No organomegaly or masses felt. Normal bowel sounds heard. Central nervous system: Alert and oriented. No focal neurological deficits. Extremities: Symmetric 5 x 5 power. Skin: No rashes, lesions or ulcers Psychiatry: Judgement and insight appear normal. Mood & affect appropriate.     Data Reviewed: I have personally reviewed following labs and imaging studies  CBC:  Recent Labs Lab 11/29/16 0936 11/30/16 0503 12/01/16 0757  WBC 9.2 7.7 8.7  NEUTROABS 6.1 4.7  --   HGB 12.8* 12.5* 12.9*  HCT 37.3* 36.8* 37.5*  MCV 85.7 86.4 85.6  PLT 263 231 007   Basic Metabolic Panel:  Recent Labs Lab 11/29/16 0936 11/29/16 1337 11/30/16 0503 12/01/16 0757 12/01/16 1257  NA 138 137 135 135  --   K 3.4* 3.2* 3.6 3.9  --   CL 103 101 102 101  --   CO2 _0 --   GLUCOSE 232* 179* 209* 216* 439*  BUN _1 --   CREATININE 0.73 0.67 0.64 0.63  --   CALCIUM 9.1 8.7* 8.1* 8.7*  --   MG 1.5*  --  2.1  --   --    GFR: Estimated Creatinine Clearance: 88.7 mL/min (by C-G formula based on SCr of 0.63 mg/dL). Liver Function Tests:  Recent Labs Lab 11/29/16 0936  AST 21  ALT 16*  ALKPHOS 70  BILITOT 0.9  PROT 6.8  ALBUMIN 3.7   No results for input(s): LIPASE, AMYLASE in the last 168 hours. No results for input(s): AMMONIA in the last 168 hours. Coagulation Profile:  Recent Labs Lab 11/30/16 0503  INR 1.08   Cardiac Enzymes:  Recent Labs Lab 11/29/16 0936 11/29/16 1337 11/29/16 1940 11/30/16 0024  TROPONINI 0.49* 0.55* 0.53* 0.50*   BNP (last 3 results) No results for input(s): PROBNP in the last 8760 hours. HbA1C:  Recent Labs  11/29/16 1600  HGBA1C 12.2*   CBG:  Recent Labs Lab 11/30/16 1641 11/30/16 2102 12/01/16 0820 12/01/16 1239 12/01/16 1701  GLUCAP 244* 287* 213* 422* 150*   Lipid Profile:  Recent Labs  11/30/16 0503    CHOL 47  HDL 21*  LDLCALC 11  TRIG 75  CHOLHDL 2.2   Thyroid Function Tests:  Recent Labs  11/29/16 1626  TSH 3.158   Anemia Panel: No results for input(s): VITAMINB12, FOLATE, FERRITIN, TIBC, IRON, RETICCTPCT in the last 72 hours. Sepsis Labs: No results for input(s): PROCALCITON, LATICACIDVEN in the last 168 hours.  No results found for this or any previous visit (from the past 240 hour(s)).       Radiology Studies: Dg Chest 2 View  Result Date: 11/30/2016 CLINICAL DATA:  Shortness of breath EXAM: CHEST  2 VIEW COMPARISON:  11/29/2016 FINDINGS: Progressive interstitial opacities with Kerley lines. Right perihilar opacity is less asymmetric appearing today. No effusion. Normal heart size. Spondylosis. No acute osseous finding. IMPRESSION: Mildly progressive interstitial opacities. More Awanda Mink  lines are seen, this may be edema instead of atypical infection. Electronically Signed   By: Monte Fantasia M.D.   On: 11/30/2016 09:39        Scheduled Meds: . aspirin EC  81 mg Oral Daily  . atorvastatin  80 mg Oral Daily  . carvedilol  12.5 mg Oral BID WC  . clopidogrel  75 mg Oral Daily  . fenofibrate  160 mg Oral Daily  . furosemide  80 mg Intravenous TID  . guaiFENesin  1,200 mg Oral BID  . insulin aspart  0-20 Units Subcutaneous TID WC  . insulin aspart  0-5 Units Subcutaneous QHS  . insulin glargine  20 Units Subcutaneous QHS  . insulin starter kit- pen needles  1 kit Other Once  . ipratropium-albuterol  3 mL Nebulization TID  . potassium chloride  20 mEq Oral BID  . [START ON 12/02/2016] sacubitril-valsartan  1 tablet Oral BID  . sodium chloride flush  3 mL Intravenous Q12H   Continuous Infusions: . sodium chloride    . azithromycin Stopped (12/01/16 1142)  . cefTRIAXone (ROCEPHIN)  IV Stopped (12/01/16 1022)  . heparin 1,300 Units/hr (12/01/16 0520)     LOS: 2 days    Time spent: 32 minutes    Rexene Alberts, MD [facility] Pager  [phone]  If 7PM-7AM, please contact night-coverage [email or web address] Password [email or web address] 12/01/2016, 7:30 PM

## 2016-12-01 NOTE — Progress Notes (Signed)
ANTICOAGULATION CONSULT NOTE - Follow Up Consult  Pharmacy Consult for Heparin Indication: Chest pain/ACS No Known Allergies  Patient Measurements: Height: 5\' 3"  (160 cm) Weight: 168 lb 8 oz (76.4 kg) IBW/kg (Calculated) : 56.9 Heparin Dosing Weight: 73 kg  Vital Signs: Temp: 97.8 F (36.6 C) (08/08 0441) Temp Source: Oral (08/08 0441) BP: 125/70 (08/08 0854) Pulse Rate: 78 (08/08 0854)  Labs:  Recent Labs  11/29/16 0936 11/29/16 1337 11/29/16 1940 11/30/16 0024 11/30/16 0503 11/30/16 0957 12/01/16 0757 12/01/16 0801  HGB 12.8*  --   --   --  12.5*  --  12.9*  --   HCT 37.3*  --   --   --  36.8*  --  37.5*  --   PLT 263  --   --   --  231  --  232  --   LABPROT  --   --   --   --  14.1  --   --   --   INR  --   --   --   --  1.08  --   --   --   HEPARINUNFRC  --   --   --  0.28*  --  0.32  --  0.38  CREATININE 0.73 0.67  --   --  0.64  --  0.63  --   TROPONINI 0.49* 0.55* 0.53* 0.50*  --   --   --   --    Estimated Creatinine Clearance: 88.7 mL/min (by C-G formula based on SCr of 0.63 mg/dL).  Assessment: 60 yo male on IV heparin for r/o ACS. Heparin level at goal.  CBC stable, no bleeding noted.  ECHO pending, if EF markedly worse will need transfer to [facility] for right and left cath per Cards.  Goal of Therapy:  Heparin level goal 0.3-0.7 units/ml Monitor platelets by anticoagulation protocol: Yes  Plan:  Continue heparin infusion to 1300 units/ml Daily Heparin Level and CBC  Samnang Shugars A 12/01/2016,11:26 AM

## 2016-12-01 NOTE — Progress Notes (Signed)
Called lab to verify they have received the blood sample for blood glucose.  They say they have the sample and are reading it right now 1:40PM.

## 2016-12-01 NOTE — Progress Notes (Signed)
Pt received 20 units of Novolog to cover elevated CBG.

## 2016-12-01 NOTE — Progress Notes (Addendum)
Pt's CBG was 422 at 12:39pm. Ordered a STAT blood draw at 12:45PM and notified Dr. Caryn Section. Called lab at 12:53pm and notified them of STAT order. Still awaiting results.

## 2016-12-01 NOTE — Plan of Care (Signed)
Problem: Food- and Nutrition-Related Knowledge Deficit (NB-1.1) Goal: Nutrition education Formal process to instruct or train a patient/client in a skill or to impart knowledge to help patients/clients voluntarily manage or modify food choices and eating behavior to maintain or improve health.  Outcome: Progressing Education done around 12:30-late entry due to [facility] downtime  RD consulted for nutrition education regarding uncontrolled diabetes, in setting of A1C >12.   Lab Results  Component Value Date   HGBA1C 12.2 (H) 11/29/2016   Patient reports being diabetic since 1990. Has always had having trouble controlling BG. He states he cant keep BG below 200; "It doesn't matter what I eat".   Obtained dietary Recall Breakfast: Fast Food. Omelet or a [employer] Bisuit. Will drink Coffee with splenda-no juice Lunch: Will bring lunch from home. Typically it is a ham or bologna sandwich. White bread  Dinner: His little brothers girlfriend does the cooking. However, she does not make full meals; "just a little this and a little that". This meal does not always contain a protein and "not a whole lot of vegetables". He says it usually consists of potatoes in some form. He also endorses frequent corn intake.  Beverages: Water, diet pepsi, coffee with sweetener.  Other: He does not count carbs. He does not read labels. States he never skips meals. Does not eat much fruit  His dinner sounds to be the area he can improve upon the most. RD asked if he could not prepare his own meals and that is why he relied on his brother's girlfriend. He says he did not have trouble preparing own meals, but this was easier. He is aware that corn/potatoes are not true vegetables, but are starch.   RD showed My Plate template and discussed best food group ratios at each meal for optimal BG control: 25-50% protein, 25-50% vegetables and 25% starch/carbs. Pointed out his meals sound to be mainly carbohydrates, with little to  balance these out.   Addressed his habit of consuming the gravy biscuit in the mornings. Again, told that these are high in carbs (>45) and doesn't sound to have much to balance the carbs out. Honestly, this habit is probably more detrimental to his heart failure with sodium content of 1500-2500 per biscuit. This is on top of the extremely sodium heavy lunch meats he eats on a daily basis.   RD gave him a very simple recommendation; change his white bread to whole grain. He says he couldn't do this because he doesn't like the taste of wheat bread at all.   Despite being a diabetic for 30 years, he does not read labels or count carbs. RD showed him a label. Asked him to try to choose foods that are high in protein/fiber and low in total carbs/added sugar. He should find that these items are vegetables and proteins. RD gave him a goal of 80 g of carb per meal.   Of course, advocated for activity as able. 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  The two areas that he seems to be doing well in are beverages and meal consistency. Commended him on only drinking SF beverages and not skipping meals.   Lack of motivation seems to be primary barrier to improved DM control. He seems to want to rely on his medication to control his BG completely so he can eat whatever he wants. Seems  to have lack of motivation. RD asked If he was aware of potential complications of uncontrolled BG. He said he was and mentioned a couple.    RD provided "Diabetes Type 2 Nutrition Therapy" and "Diabetes label reading tips" handouts from the Academy of Nutrition and Dietetics as well as a copy of the My Plate Template for Diabetics  Summary of Recommendations 1. Decrease potato/corn intake at dinner. BALANCE meals. Plate method. Always have protein/veg.  2. Avoid the [employer] biscuits in morning.  Choose high fiber/protein items. Omelet is a good choice.  3. Switch to whole grain bread to increase fiber intake 4. Read labels! Aim for no more than 80g carbs/meal  Expect poor-Fair compliance. He was pleasant/receptive of recommendation and is aware of potential complications of uncontrolled DM, but did not seem very motivated  Body mass index is 29.85 kg/m. Pt meets criteria for overweight based on current BMI.  Current diet order is Consistent Carbohydrate, patient is consuming approximately 75-100% of meals at this time. Labs and medications reviewed. No further nutrition interventions warranted at this time.  If additional nutrition issues arise, please re-consult RD.  Burtis Junes RD, LDN, CNSC Clinical Nutrition Pager: [phone] 12/01/2016 5:15 PM

## 2016-12-01 NOTE — Progress Notes (Signed)
STAT lab draw glucose was 439. Paged Dr. Caryn Section for instructions of insulin dose to give.

## 2016-12-01 NOTE — Progress Notes (Signed)
Progress Note  Patient Name: Grant Moran Date of Encounter: 12/01/2016  Primary Cardiologist: Dr. Acie Fredrickson ([facility] OV 04/2015)  Subjective   Feeling much better this morning. No CP or SOB.  Inpatient Medications    Scheduled Meds: . aspirin EC  81 mg Oral Daily  . atorvastatin  80 mg Oral Daily  . carvedilol  12.5 mg Oral BID WC  . clopidogrel  75 mg Oral Daily  . fenofibrate  160 mg Oral Daily  . furosemide  80 mg Intravenous BID  . guaiFENesin  1,200 mg Oral BID  . insulin aspart  0-15 Units Subcutaneous TID WC  . insulin aspart  0-5 Units Subcutaneous QHS  . insulin glargine  14 Units Subcutaneous QHS  . insulin starter kit- pen needles  1 kit Other Once  . ipratropium-albuterol  3 mL Nebulization TID  . losartan  50 mg Oral Daily  . potassium chloride  20 mEq Oral BID  . sodium chloride flush  3 mL Intravenous Q12H   Continuous Infusions: . sodium chloride    . azithromycin Stopped (11/30/16 1105)  . cefTRIAXone (ROCEPHIN)  IV 1 g (12/01/16 0952)  . heparin 1,300 Units/hr (12/01/16 0520)   PRN Meds: sodium chloride, acetaminophen, calcium carbonate, ipratropium-albuterol, nitroGLYCERIN, ondansetron (ZOFRAN) IV, sodium chloride flush   Vital Signs    Vitals:   11/30/16 2100 12/01/16 0441 12/01/16 0749 12/01/16 0854  BP: (!) 93/58 (!) 132/55  125/70  Pulse: 85 84  78  Resp: 18 18    Temp: 98.2 F (36.8 C) 97.8 F (36.6 C)    TempSrc: Oral Oral    SpO2: 99% 94% 97%   Weight:  168 lb 8 oz (76.4 kg)    Height:        Intake/Output Summary (Last 24 hours) at 12/01/16 1040 Last data filed at 12/01/16 0939  Gross per 24 hour  Intake           817.13 ml  Output             1600 ml  Net          -782.87 ml   Filed Weights   11/29/16 1329 11/30/16 0621 12/01/16 0441  Weight: 169 lb 11.2 oz (77 kg) 170 lb 8 oz (77.3 kg) 168 lb 8 oz (76.4 kg)    Telemetry    NSR rare PVC - Personally Reviewed  Physical Exam   GEN: No acute distress.  HEENT:  Normocephalic, atraumatic, sclera non-icteric. Neck: No JVD or bruits. Cardiac: RRR no murmurs, rubs, or gallops.  Radials/DP/PT 1+ and equal bilaterally.  Respiratory: Clear to auscultation bilaterally. Breathing is unlabored. GI: Soft, nontender, non-distended, BS +x 4. MS: no deformity. Extremities: No clubbing or cyanosis. No edema. Distal pedal pulses are 2+ and equal bilaterally. Neuro:  AAOx3. Follows commands. Psych:  Responds to questions appropriately with a normal affect.  Labs    Chemistry Recent Labs Lab 11/29/16 0936 11/29/16 1337 11/30/16 0503 12/01/16 0757  NA 138 137 135 135  K 3.4* 3.2* 3.6 3.9  CL 103 101 102 101  CO2 23 24 23 24   GLUCOSE 232* 179* 209* 216*  BUN 12 11 13 11   CREATININE 0.73 0.67 0.64 0.63  CALCIUM 9.1 8.7* 8.1* 8.7*  PROT 6.8  --   --   --   ALBUMIN 3.7  --   --   --   AST 21  --   --   --   ALT 16*  --   --   --  ALKPHOS 70  --   --   --   BILITOT 0.9  --   --   --   GFRNONAA >60 >60 >60 >60  GFRAA >60 >60 >60 >60  ANIONGAP 12 12 10 10      Hematology Recent Labs Lab 11/29/16 0936 11/30/16 0503 12/01/16 0757  WBC 9.2 7.7 8.7  RBC 4.35 4.26 4.38  HGB 12.8* 12.5* 12.9*  HCT 37.3* 36.8* 37.5*  MCV 85.7 86.4 85.6  MCH 29.4 29.3 29.5  MCHC 34.3 34.0 34.4  RDW 13.7 13.7 13.6  PLT 263 231 232    Cardiac Enzymes Recent Labs Lab 11/29/16 0936 11/29/16 1337 11/29/16 1940 11/30/16 0024  TROPONINI 0.49* 0.55* 0.53* 0.50*   No results for input(s): TROPIPOC in the last 168 hours.   BNP Recent Labs Lab 11/29/16 0936 11/30/16 0503  BNP 1,413.0* 389.0*     DDimer  Recent Labs Lab 11/29/16 0936  DDIMER 0.28     Radiology    Dg Chest 2 View  Result Date: 11/30/2016 CLINICAL DATA:  Shortness of breath EXAM: CHEST  2 VIEW COMPARISON:  11/29/2016 FINDINGS: Progressive interstitial opacities with Awanda Mink lines. Right perihilar opacity is less asymmetric appearing today. No effusion. Normal heart size. Spondylosis. No  acute osseous finding. IMPRESSION: Mildly progressive interstitial opacities. More [employer] are seen, this may be edema instead of atypical infection. Electronically Signed   By: Monte Fantasia M.D.   On: 11/30/2016 09:39    Cardiac Studies   2D Echo 11/30/16 Study Conclusions  - Left ventricle: Diffuse hypokinesis worse in the inferior wall   and septum The cavity size was moderately dilated. Wall thickness   was increased in a pattern of moderate LVH. The estimated   ejection fraction was 30%. Left ventricular diastolic function   parameters were normal. - Mitral valve: There was mild regurgitation. - Left atrium: The atrium was moderately dilated. - Atrial septum: No defect or patent foramen ovale was identified. - Pulmonary arteries: PA peak pressure: 40 mm Hg (S).  Patient Profile   61 y/o M with CAD (IMI 01/2000 s/p BMS to RCA, restenosis tx with angioplasty/brachytherapy 00/3704), chronic systolic CHF (prior EF 88-89%) , CVA, HTN, uncontrolled DM with A1C 12.2, HLD, prior gastric ulcers admitted with dyspnea/orthopnea, elevated troponin, suspected a/c systolic CHF and possible PNA. Last EF assessment by TEE in 04/2015 was 40-45% (same admission was 35-40% by 2D echo).  Assessment & Plan    1. Acute on chronic systolic CHF with worsening LV function (EF 30%) - weights have not changed much, 169->170->168 but patient reports feeling significantly better and is net -993. Dry weight not totally known. Patient reports he's been running around 170 lately. By PCP's office, he was 170 in 01/2016, 156 in 06/2016, and 163 in 08/2016. Will discuss plan for diuretic with MD. BPs yesterday were on softer side so would not accelerate HF regimen today but would consider doing so tomorrow. Would consider changing losartan to [facility] as OP once he's completed diuresis if BP is stable, versus addition of spironolactone. The plan as outlined earlier this week was to consider cath if EF is down, which  it is. EKG is also abnormal. I would favor proceeding with cardiac cath (could also consider doing right to assess volume status). Risks and benefits of cardiac catheterization have been discussed with the patient. These include bleeding, infection, kidney damage, stroke, heart attack, death. The patient understands these risks and is willing to proceed. With his uncontrolled DM  and prior known CAD (last studied 17 years ago), I would not be surprised of progressive disease. Will finalize plan with MD. Damaris Schooner with cath lab tentatively and they would not be able to accomodate today, so if MD agrees with need for cath, would need to be placed on board for tomorrow.  2. CAD with possible NSTEMI - see above. Troponin is flat so may not be classic ACS, but concerning given his LVEF 40s->30%. Continue ASA, BB, statin, Plavix and heparin. In retrospect he does report occasional chest pain 1x a week or so, but this has been very vague and nondescript. SOB was 3-4 days prior to admission.  3. Possible PNA - given resolution of sx with diuresis, now doubt validity of original dx. Further management per IM.  4. Uncontrolled DM - per IM. Patient was getting teaching in room when I visited today.  Signed, Charlie Pitter, PA-C  12/01/2016, 10:40 AM    Attending note Patient seen and discussed with PA Dunn, I agree with her documentation above. Admitted with acute on chronic systolic HF. Echo Jan 2017 LVEF 40-45%. Repeat echo this admit down to 30%. With his known hstory of CAD, LVEF drop and elevated troponin concern for progressive CAD/ischemic heart disease. We will plan for transfer for LHC/RHC. Negative 570 mL yesterday, negative nearly 1 liters since admission. Stable renal function, he is on lasix 42m bid. CXR with ongoing edema.  Not much net diuresis, will increase lasix to 812mIV tid. Soft bp's at times, will not titrate meds at this time, agree with changing to entresto this admission. Flat troponin in absence  of chest pain, will not start anticoag at this time.  JoCarlyle DollyD

## 2016-12-02 DIAGNOSIS — I252 Old myocardial infarction: Secondary | ICD-10-CM | POA: Diagnosis present

## 2016-12-02 LAB — CBC
HCT: 40.2 % (ref 39.0–52.0)
Hemoglobin: 13.5 g/dL (ref 13.0–17.0)
MCH: 28.7 pg (ref 26.0–34.0)
MCHC: 33.6 g/dL (ref 30.0–36.0)
MCV: 85.5 fL (ref 78.0–100.0)
PLATELETS: 266 10*3/uL (ref 150–400)
RBC: 4.7 MIL/uL (ref 4.22–5.81)
RDW: 13.4 % (ref 11.5–15.5)
WBC: 7.3 10*3/uL (ref 4.0–10.5)

## 2016-12-02 LAB — GLUCOSE, CAPILLARY
GLUCOSE-CAPILLARY: 239 mg/dL — AB (ref 65–99)
GLUCOSE-CAPILLARY: 285 mg/dL — AB (ref 65–99)
GLUCOSE-CAPILLARY: 157 mg/dL — AB (ref 65–99)
GLUCOSE-CAPILLARY: 320 mg/dL — AB (ref 65–99)

## 2016-12-02 LAB — BASIC METABOLIC PANEL
Anion gap: 9 (ref 5–15)
BUN: 16 mg/dL (ref 6–20)
CALCIUM: 8.9 mg/dL (ref 8.9–10.3)
CO2: 25 mmol/L (ref 22–32)
CREATININE: 0.82 mg/dL (ref 0.61–1.24)
Chloride: 99 mmol/L — ABNORMAL LOW (ref 101–111)
GFR calc non Af Amer: >60 mL/min (ref >60)
Glucose, Bld: 266 mg/dL — ABNORMAL HIGH (ref 65–99)
Potassium: 4 mmol/L (ref 3.5–5.1)
SODIUM: 133 mmol/L — AB (ref 135–145)

## 2016-12-02 LAB — LEGIONELLA PNEUMOPHILA SEROGP 1 UR AG: L. pneumophila Serogp 1 Ur Ag: NEGATIVE

## 2016-12-02 LAB — HEPARIN LEVEL (UNFRACTIONATED)
HEPARIN UNFRACTIONATED: 0.28 [IU]/mL — AB (ref 0.30–0.70)
Heparin Unfractionated: 0.32 IU/mL (ref 0.30–0.70)

## 2016-12-02 MED ORDER — SODIUM CHLORIDE 0.9% FLUSH
3.0000 mL | INTRAVENOUS | Status: DC | PRN
Start: 2016-12-02 — End: 2016-12-03

## 2016-12-02 MED ORDER — ASPIRIN 81 MG PO CHEW
81.0000 mg | CHEWABLE_TABLET | ORAL | Status: AC
  Administered 2016-12-03: 81 mg via ORAL
  Filled 2016-12-02: qty 1

## 2016-12-02 MED ORDER — SODIUM CHLORIDE 0.9 % IV SOLN: INTRAVENOUS | Status: DC

## 2016-12-02 MED ORDER — SODIUM CHLORIDE 0.9 % IV SOLN: 250.0000 mL | INTRAVENOUS | Status: DC | PRN

## 2016-12-02 MED ORDER — SODIUM CHLORIDE 0.9% FLUSH: 3.0000 mL | INTRAVENOUS | Status: DC | PRN

## 2016-12-02 MED ORDER — SODIUM CHLORIDE 0.9 % IV SOLN
250.0000 mL | INTRAVENOUS | Status: DC | PRN
Start: 2016-12-02 — End: 2016-12-03

## 2016-12-02 MED ORDER — SODIUM CHLORIDE 0.9% FLUSH
3.0000 mL | Freq: Two times a day (BID) | INTRAVENOUS | Status: DC
  Administered 2016-12-02: 3 mL via INTRAVENOUS

## 2016-12-02 MED ORDER — SODIUM CHLORIDE 0.9% FLUSH: 3.0000 mL | Freq: Two times a day (BID) | INTRAVENOUS | Status: DC

## 2016-12-02 NOTE — Progress Notes (Signed)
ANTICOAGULATION CONSULT NOTE - Follow Up Consult  Pharmacy Consult for Heparin Indication: Chest pain/ACS No Known Allergies  Patient Measurements: Height: 5\' 3"  (160 cm) Weight: 165 lb 3.2 oz (74.9 kg) IBW/kg (Calculated) : 56.9 Heparin Dosing Weight: 73 kg  Vital Signs: Temp: 98.6 F (37 C) (08/09 2003) Temp Source: Oral (08/09 2003) BP: 96/58 (08/09 2003) Pulse Rate: 91 (08/09 2003)  Labs:  Recent Labs  11/30/16 0024  11/30/16 0503  12/01/16 0757 12/01/16 0801 12/02/16 0645 12/02/16 0648 12/02/16 2149  HGB  --   < > 12.5*  --  12.9*  --  13.5  --   --   HCT  --   --  36.8*  --  37.5*  --  40.2  --   --   PLT  --   --  231  --  232  --  266  --   --   LABPROT  --   --  14.1  --   --   --   --   --   --   INR  --   --  1.08  --   --   --   --   --   --   HEPARINUNFRC 0.28*  --   --   < >  --  0.38  --  0.28* 0.32  CREATININE  --   --  0.64  --  0.63  --  0.82  --   --   TROPONINI 0.50*  --   --   --   --   --   --   --   --   < > = values in this interval not displayed. Estimated Creatinine Clearance: 85.8 mL/min (by C-G formula based on SCr of 0.82 mg/dL).  Assessment: 61 yo male on IV heparin for r/o ACS. Heparin level just therapeutic at 0.32.  No bleeding noted.  Plan for cath in the morning.  Goal of Therapy:  Heparin level goal 0.3-0.7 units/ml Monitor platelets by anticoagulation protocol: Yes  Plan:  Increase heparin infusion to 1450 units/ml Daily Heparin Level and CBC   Thank you for allowing [place] to participate in this patients care. Jens Som, PharmD

## 2016-12-02 NOTE — Progress Notes (Signed)
Called report to [place] nurse for patient.  Gave report to [facility] personnel over phone as well.

## 2016-12-02 NOTE — Progress Notes (Signed)
Patient being transferred to [facility] now via [facility].

## 2016-12-02 NOTE — Progress Notes (Signed)
ANTICOAGULATION CONSULT NOTE - Follow Up Consult  Pharmacy Consult for Heparin Indication: Chest pain/ACS No Known Allergies  Patient Measurements: Height: 5\' 3"  (160 cm) Weight: 165 lb 3.2 oz (74.9 kg) IBW/kg (Calculated) : 56.9 Heparin Dosing Weight: 73 kg  Vital Signs: Temp: 98 F (36.7 C) (08/09 0500) Temp Source: Oral (08/09 0500) BP: 119/67 (08/09 0500) Pulse Rate: 86 (08/09 0500)  Labs:  Recent Labs  11/29/16 1337 11/29/16 1940  11/30/16 0024 11/30/16 0503 11/30/16 0957 12/01/16 0757 12/01/16 0801 12/02/16 0645 12/02/16 0648  HGB  --   --   --   --  12.5*  --  12.9*  --  13.5  --   HCT  --   --   --   --  36.8*  --  37.5*  --  40.2  --   PLT  --   --   --   --  231  --  232  --  266  --   LABPROT  --   --   --   --  14.1  --   --   --   --   --   INR  --   --   --   --  1.08  --   --   --   --   --   HEPARINUNFRC  --   --   < > 0.28*  --  0.32  --  0.38  --  0.28*  CREATININE 0.67  --   --   --  0.64  --  0.63  --  0.82  --   TROPONINI 0.55* 0.53*  --  0.50*  --   --   --   --   --   --   < > = values in this interval not displayed. Estimated Creatinine Clearance: 85.8 mL/min (by C-G formula based on SCr of 0.82 mg/dL).  Assessment: 61 yo male on IV heparin for r/o ACS. Heparin level slightly less than goal.  CBC stable, no bleeding noted.  Plan transfer to [facility] for right and left cath per Cards.  Goal of Therapy:  Heparin level goal 0.3-0.7 units/ml Monitor platelets by anticoagulation protocol: Yes  Plan:  Increase heparin infusion to 1400 units/ml Daily Heparin Level and CBC  Amorette Charrette Poteet 12/02/2016,8:29 AM

## 2016-12-02 NOTE — Progress Notes (Signed)
Inpatient Diabetes Program Recommendations  AACE/ADA: New Consensus Statement on Inpatient Glycemic Control (2015)  Target Ranges:  Prepandial:   less than 140 mg/dL      Peak postprandial:   less than 180 mg/dL (1-2 hours)      Critically ill patients:  140 - 180 mg/dL   Results for IRWIN, TORAN (MRN [id number]) as of 12/02/2016 09:35  Ref. Range 12/01/2016 08:20 12/01/2016 12:39 12/01/2016 17:01 12/01/2016 20:24 12/02/2016 07:46  Glucose-Capillary Latest Ref Range: 65 - 99 mg/dL 213 (H) 422 (H) 150 (H) 288 (H) 239 (H)   Review of Glycemic Control Outpatient Diabetes medications: Glucovance 5-500 mg BID, Invokana 300 mg daily, Januvia 100 mg daily Current orders for Inpatient glycemic control: Lantus 20 units QHS, Novolog 0-20 units TID with meals, Novolog 0-5 units QHS   Inpatient Diabetes Program Recommendations: Insulin - Basal:  Please consider increasing Lantus to 23 units QHS. Insulin - Meal Coverage: Once diet is resumed and if post prandial glucose continues to be elevated, please consider ordering Novolog 4 units TID for meal coverage if patient eats at least 50% of meals. HgbA1C: A1C 12.2% on 11/29/16 indicating an average glucose of 303 mg/dl over the past 2-3 months. Patient needs to follow up with PCP or Endocrinologist regarding improving DM control. Patient has been educated on insulin (patient prefers insulin pens) as he will likely be discharged new to insulin.   Thanks, Barnie Alderman, RN, MSN, CDE Diabetes Coordinator Inpatient Diabetes Program [phone] (Team Pager from 8am to 5pm)

## 2016-12-02 NOTE — Progress Notes (Addendum)
PROGRESS NOTE    Grant Moran  XVQ:[id number] DOB: November 09, 1955 DOA: 11/29/2016 PCP: System, Provider Not In    Brief Narrative:  Patient is a 61 year old man with a history of DM-2, CAD, cardiomyopathy with an EF of 35-40% per previous echo, HTN, and hyperlipidemia, who presented to the ED on 11/29/16 with a complaint of shortness of breath. In the ED, his BNP was 1413 and his first troponin I was elevated at 0.49. His chest x-ray revealed bronchitic type changes, patchy reticular density about the right hilum which could be atelectasis or bronchopneumonia. He was admitted for further evaluation and management.    Assessment & Plan:   Principal Problem:   Acute on chronic systolic CHF (congestive heart failure) (HCC) Active Problems:   CAD (coronary artery disease)   Cerebral infarction (HCC)   Diabetes (HCC)   Hypertension   Diabetic mononeuropathy associated with type 2 diabetes mellitus (HCC)   HLD (hyperlipidemia)   Type 2 diabetes mellitus with circulatory disorder (HCC)   Essential hypertension   CHF (congestive heart failure) (HCC)   CAP (community acquired pneumonia)   Elevated troponin   Hypokalemia   NSTEMI (non-ST elevated myocardial infarction) [facility])   Patient is a 61 year old man with chronic systolic congestive heart failure, CAD, diabetes mellitus, and hyperlipidemia, who presented with shortness of breath. He was found to have an elevated troponin I, acute on chronic systolic congestive heart failure, and possible community-acquired pneumonia. Follow-up 2-D echocardiogram revealed an ejection fraction of 30%, worse than the previous 2-D echo. Patient was started on IV Lasix, carvedilol, Plavix, Lipitor, and Entresto by cardiology. Due to the worsening EF, cardiology plans transfer to [facility] for a cardiac catheterization on 12/02/16.  1. Acute on chronic systolic congestive heart failure. -Patient's I/O is -2.2. He has less pulmonary crackles on exam. -IV Lasix  recently increased by cardiology to 80 mg 3 times daily. -Continue carvedilol, Entresto.   CAD with non-ST elevation MI. Patient is troponin I has remained elevated but flat, ranging from 0.49-0.50. -Patient was started on a heparin drip per cardiology. Plavix and aspirin were continued. Lipitor was continued. Toprol-XL was discontinued in favor of starting carvedilol. -Patient has been hemodynamically stable. He had no complaints of chest pain this morning. -Due to the patient's worsening EF, cardiac catheterization has been recommended by cardiology. The plan is for patients transferred to [facility] for cardiac catheterization today 12/02/16.  Community-acquired pneumonia. -Patient's chest x-ray revealed a reticular density in the right hilum which could be atelectasis or bronchopneumonia. -He was started on treatment for a clinical diagnosis of community-acquired pneumonia. -We'll continue azithromycin and Rocephin for total of 5 days. Recommended end date 12/04/16 after the days doses. -Clinically improving.  Type 2 diabetes. Patient is treated chronically with Glucovance, Invokana,, and Januvia in the outpatient setting. Compliance is questionable given the resultant hemoglobin of 12.2. -Oral medications were withheld on admission. -Lantus and sliding scale NovoLog were started. They have been titrated up each day. His CBGs are trending down, but not optimal yet. -Diabetes coordinator recommendations noted. -We'll increase Lantus again to 25 units daily at bedtime. -We'll add 3-4 mg 3 times a day NovoLog coverage if he eats at least 50% of his meals. -Patient would benefit from follow-up with his PCP or endocrinologist regarding better management of his diabetes. Patient may also benefit from insulin, particularly Lantus and/or Levemir in the outpatient setting.  Hypokalemia. Patient was started on potassium supplementation for hypokalemia, due to IV Lasix. -His serum potassium has  improved.  Continue oral supplementation.  DVT prophylaxis: Heparin Code Status: Full code Family Communication: Discussed with family per friend with permission.  Disposition Plan: Pending transfer to [facility] for cardiac catheterization 12/02/16.   Consultants:   Cardiology  Procedures:  2-D echo 11/30/2016: Study Conclusions  - Left ventricle: Diffuse hypokinesis worse in the inferior wall   and septum The cavity size was moderately dilated. Wall thickness   was increased in a pattern of moderate LVH. The estimated   ejection fraction was 30%. Left ventricular diastolic function   parameters were normal. - Mitral valve: There was mild regurgitation. - Left atrium: The atrium was moderately dilated. - Atrial septum: No defect or patent foramen ovale was identified. - Pulmonary arteries: PA peak pressure: 40 mm Hg (S)  Antimicrobials:   IV Rocephin 11/29/2016  IV azithromycin 11/29/2016   Subjective: Patient denies chest pain and shortness of breath currently. (Delayed entry).  Objective: Vitals:   12/01/16 2026 12/01/16 2029 12/02/16 0500 12/02/16 0753  BP: (!) 123/47  119/67   Pulse: 85  86   Resp: 18  16   Temp:   98 F (36.7 C)   TempSrc:   Oral   SpO2: 92% 92% 95% 96%  Weight:   74.9 kg (165 lb 3.2 oz)   Height:        Intake/Output Summary (Last 24 hours) at 12/02/16 1106 Last data filed at 12/02/16 1007  Gross per 24 hour  Intake          1033.87 ml  Output             2150 ml  Net         -1116.13 ml   Filed Weights   11/30/16 0621 12/01/16 0441 12/02/16 0500  Weight: 77.3 kg (170 lb 8 oz) 76.4 kg (168 lb 8 oz) 74.9 kg (165 lb 3.2 oz)    Examination:  General exam: Appears calm and comfortable  Respiratory system: Scattered crackles. Some coarse breath sounds. Respiratory effort normal. Cardiovascular system: S1 & S2 heard, RRR. No JVD, murmurs, rubs, gallops or clicks. No pedal edema. Gastrointestinal system: Abdomen is nondistended, soft and nontender. No  organomegaly or masses felt. Normal bowel sounds heard. Central nervous system: Alert and oriented. No focal neurological deficits. Extremities: Symmetric 5 x 5 power. Skin: No rashes, lesions or ulcers Psychiatry: Judgement and insight appear normal. Mood & affect appropriate.     Data Reviewed: I have personally reviewed following labs and imaging studies  CBC:  Recent Labs Lab 11/29/16 0936 11/30/16 0503 12/01/16 0757 12/02/16 0645  WBC 9.2 7.7 8.7 7.3  NEUTROABS 6.1 4.7  --   --   HGB 12.8* 12.5* 12.9* 13.5  HCT 37.3* 36.8* 37.5* 40.2  MCV 85.7 86.4 85.6 85.5  PLT 263 231 232 034   Basic Metabolic Panel:  Recent Labs Lab 11/29/16 0936 11/29/16 1337 11/30/16 0503 12/01/16 0757 12/01/16 1257 12/02/16 0645  NA 138 137 135 135  --  133*  K 3.4* 3.2* 3.6 3.9  --  4.0  CL 103 101 102 101  --  99*  CO2 23 24 23 24   --  25  GLUCOSE 232* 179* 209* 216* 439* 266*  BUN 12 11 13 11   --  16  CREATININE 0.73 0.67 0.64 0.63  --  0.82  CALCIUM 9.1 8.7* 8.1* 8.7*  --  8.9  MG 1.5*  --  2.1  --   --   --    GFR:  Estimated Creatinine Clearance: 85.8 mL/min (by C-G formula based on SCr of 0.82 mg/dL). Liver Function Tests:  Recent Labs Lab 11/29/16 0936  AST 21  ALT 16*  ALKPHOS 70  BILITOT 0.9  PROT 6.8  ALBUMIN 3.7   No results for input(s): LIPASE, AMYLASE in the last 168 hours. No results for input(s): AMMONIA in the last 168 hours. Coagulation Profile:  Recent Labs Lab 11/30/16 0503  INR 1.08   Cardiac Enzymes:  Recent Labs Lab 11/29/16 0936 11/29/16 1337 11/29/16 1940 11/30/16 0024  TROPONINI 0.49* 0.55* 0.53* 0.50*   BNP (last 3 results) No results for input(s): PROBNP in the last 8760 hours. HbA1C:  Recent Labs  11/29/16 1600  HGBA1C 12.2*   CBG:  Recent Labs Lab 12/01/16 0820 12/01/16 1239 12/01/16 1701 12/01/16 2024 12/02/16 0746  GLUCAP 213* 422* 150* 288* 239*   Lipid Profile:  Recent Labs  11/30/16 0503  CHOL 47  HDL  21*  LDLCALC 11  TRIG 75  CHOLHDL 2.2   Thyroid Function Tests:  Recent Labs  11/29/16 1626  TSH 3.158   Anemia Panel: No results for input(s): VITAMINB12, FOLATE, FERRITIN, TIBC, IRON, RETICCTPCT in the last 72 hours. Sepsis Labs: No results for input(s): PROCALCITON, LATICACIDVEN in the last 168 hours.  No results found for this or any previous visit (from the past 240 hour(s)).       Radiology Studies: No results found.      Scheduled Meds: . aspirin EC  81 mg Oral Daily  . atorvastatin  80 mg Oral Daily  . carvedilol  12.5 mg Oral BID WC  . clopidogrel  75 mg Oral Daily  . fenofibrate  160 mg Oral Daily  . furosemide  80 mg Intravenous TID  . guaiFENesin  1,200 mg Oral BID  . insulin aspart  0-20 Units Subcutaneous TID WC  . insulin aspart  0-5 Units Subcutaneous QHS  . insulin glargine  20 Units Subcutaneous QHS  . insulin starter kit- pen needles  1 kit Other Once  . ipratropium-albuterol  3 mL Nebulization TID  . potassium chloride  20 mEq Oral BID  . sacubitril-valsartan  1 tablet Oral BID  . sodium chloride flush  3 mL Intravenous Q12H   Continuous Infusions: . sodium chloride    . azithromycin 500 mg (12/02/16 1007)  . cefTRIAXone (ROCEPHIN)  IV Stopped (12/02/16 0945)  . heparin 1,400 Units/hr (12/02/16 0804)     LOS: 3 days    Time spent: 12 minutes    Rexene Alberts, MD [facility] Pager [phone]  If 7PM-7AM, please contact night-coverage [email or web address] Password [email or web address] 12/02/2016, 11:06 AM

## 2016-12-02 NOTE — Progress Notes (Addendum)
Progress Note  Patient Name: Grant Moran Date of Encounter: 12/02/2016  Primary Cardiologist: Nahser  Subjective   NO complaints of chest pain. Dyspnea when he gets up to go to the bathroom.   Inpatient Medications    Scheduled Meds: . aspirin EC  81 mg Oral Daily  . atorvastatin  80 mg Oral Daily  . carvedilol  12.5 mg Oral BID WC  . clopidogrel  75 mg Oral Daily  . fenofibrate  160 mg Oral Daily  . furosemide  80 mg Intravenous TID  . guaiFENesin  1,200 mg Oral BID  . insulin aspart  0-20 Units Subcutaneous TID WC  . insulin aspart  0-5 Units Subcutaneous QHS  . insulin glargine  20 Units Subcutaneous QHS  . insulin starter kit- pen needles  1 kit Other Once  . ipratropium-albuterol  3 mL Nebulization TID  . potassium chloride  20 mEq Oral BID  . sacubitril-valsartan  1 tablet Oral BID  . sodium chloride flush  3 mL Intravenous Q12H   Continuous Infusions: . sodium chloride    . azithromycin Stopped (12/01/16 1142)  . cefTRIAXone (ROCEPHIN)  IV Stopped (12/01/16 1022)  . heparin 1,400 Units/hr (12/02/16 0804)   PRN Meds: sodium chloride, acetaminophen, calcium carbonate, ipratropium-albuterol, nitroGLYCERIN, ondansetron (ZOFRAN) IV, sodium chloride flush   Vital Signs    Vitals:   12/01/16 2026 12/01/16 2029 12/02/16 0500 12/02/16 0753  BP: (!) 123/47  119/67   Pulse: 85  86   Resp: 18  16   Temp:   98 F (36.7 C)   TempSrc:   Oral   SpO2: 92% 92% 95% 96%  Weight:   165 lb 3.2 oz (74.9 kg)   Height:        Intake/Output Summary (Last 24 hours) at 12/02/16 0829 Last data filed at 12/02/16 0500  Gross per 24 hour  Intake           983.87 ml  Output             3300 ml  Net         -2316.13 ml   Filed Weights   11/30/16 0621 12/01/16 0441 12/02/16 0500  Weight: 170 lb 8 oz (77.3 kg) 168 lb 8 oz (76.4 kg) 165 lb 3.2 oz (74.9 kg)    Telemetry    NSR with intraventricular conduction delay, Heart Rate in the 80's. .  Personally Reviewed   Physical  Exam   GEN: No acute distress.   Neck: No JVD Cardiac: RRR, tachycardic,  no murmurs, rubs, or gallops.  Respiratory: Clear to auscultation bilaterally. GI: Soft, nontender, non-distended  MS: No edema; No deformity. Neuro:  Nonfocal  Psych: Normal affect   Labs    Chemistry Recent Labs Lab 11/29/16 0936  11/30/16 0503 12/01/16 0757 12/01/16 1257 12/02/16 0645  NA 138  < > 135 135  --  133*  K 3.4*  < > 3.6 3.9  --  4.0  CL 103  < > 102 101  --  99*  CO2 23  < > 23 24  --  25  GLUCOSE 232*  < > 209* 216* 439* 266*  BUN 12  < > 13 11  --  16  CREATININE 0.73  < > 0.64 0.63  --  0.82  CALCIUM 9.1  < > 8.1* 8.7*  --  8.9  PROT 6.8  --   --   --   --   --   ALBUMIN  3.7  --   --   --   --   --   AST 21  --   --   --   --   --   ALT 16*  --   --   --   --   --   ALKPHOS 70  --   --   --   --   --   BILITOT 0.9  --   --   --   --   --   GFRNONAA >60  < > >60 >60  --  >60  GFRAA >60  < > >60 >60  --  >60  ANIONGAP 12  < > 10 10  --  9  < > = values in this interval not displayed.   Hematology Recent Labs Lab 11/30/16 0503 12/01/16 0757 12/02/16 0645  WBC 7.7 8.7 7.3  RBC 4.26 4.38 4.70  HGB 12.5* 12.9* 13.5  HCT 36.8* 37.5* 40.2  MCV 86.4 85.6 85.5  MCH 29.3 29.5 28.7  MCHC 34.0 34.4 33.6  RDW 13.7 13.6 13.4  PLT 231 232 266    Cardiac Enzymes Recent Labs Lab 11/29/16 0936 11/29/16 1337 11/29/16 1940 11/30/16 0024  TROPONINI 0.49* 0.55* 0.53* 0.50*   No results for input(s): TROPIPOC in the last 168 hours.   BNP Recent Labs Lab 11/29/16 0936 11/30/16 0503  BNP 1,413.0* 389.0*     DDimer  Recent Labs Lab 11/29/16 0936  DDIMER 0.28     Radiology    Dg Chest 2 View  Result Date: 11/30/2016 CLINICAL DATA:  Shortness of breath EXAM: CHEST  2 VIEW COMPARISON:  11/29/2016 FINDINGS: Progressive interstitial opacities with Awanda Mink lines. Right perihilar opacity is less asymmetric appearing today. No effusion. Normal heart size. Spondylosis. No acute  osseous finding. IMPRESSION: Mildly progressive interstitial opacities. More [employer] are seen, this may be edema instead of atypical infection. Electronically Signed   By: Monte Fantasia M.D.   On: 11/30/2016 09:39    Cardiac Studies   Echocardiogram 11/30/2016 Left ventricle: Diffuse hypokinesis worse in the inferior wall   and septum The cavity size was moderately dilated. Wall thickness   was increased in a pattern of moderate LVH. The estimated   ejection fraction was 30%. Left ventricular diastolic function   parameters were normal. - Mitral valve: There was mild regurgitation. - Left atrium: The atrium was moderately dilated. - Atrial septum: No defect or patent foramen ovale was identified. - Pulmonary arteries: PA peak pressure: 40 mm Hg (S).  Patient Profile     61 y.o. male with CAD (IMI 01/2000 s/p BMS to RCA, restenosis tx with angioplasty/brachytherapy 73/4193), chronic systolic CHF (prior EF 79-02%) , CVA, HTN, uncontrolled DM with A1C 12.2, HLD, prior gastric ulcers admitted with dyspnea/orthopnea, elevated troponin, suspected a/c systolic CHF and possible PNA. Last EF assessment by TEE in 04/2015 was 40-45% (same admission was 35-40% by 2D echo).  Assessment & Plan    1. Acute on chronic systolic CHF:  Overall improvement in symptoms without complaints of recurrent dyspnea at rest, but continues to have DOE. Creatinine this am 0.82. He continues to diurese on 80 mg TID, with output of 3,300 liters overnight with total of 2.559 liters since admission.   2. CADwith NSTEMI:  Troponin is stable. With reduced EF and known multi-vessel CAD have discussed with him cardiac cath. This was also discussed yesterday by Dr. Harl Bowie. He is willing to proceed with transfer to [facility] and undergo procedure.  He is NPO. Orders are written for cath today. Reinforced cath procedure, risks and benefits as discussed and documented yesterday. He verbalizes understanding.   3. Diabetes: Continue  care per PCP team.  Signed, Phill Myron. West Pugh, ANP, AACC   12/02/2016, 8:29 AM    Patient seen and discussed with DNP Purcell Nails, I agree with her documentation above. With drop in LVEF we will transfer to [facility] for LHC/RHC. Yesterday we increased lasix to 11m tid, much better diuresis, negative 2.3 liters yesterday and negative 2.6 liters since admission. Mild uptrend in Cr, continue IV diuretics today, follow up filling pressures from RHC and adjust accordingly. He was also changed from ARB to entresto starting today, continue beta blocker. Over time can consider aldactone.   JCarlyle DollyMD

## 2016-12-03 ENCOUNTER — Other Ambulatory Visit: Payer: Self-pay | Admitting: *Deleted

## 2016-12-03 ENCOUNTER — Encounter (HOSPITAL_COMMUNITY): Payer: Self-pay | Admitting: Cardiology

## 2016-12-03 ENCOUNTER — Encounter (HOSPITAL_COMMUNITY)
Admission: EM | Disposition: A | Discharge status: Home / Self Care | Payer: Self-pay | Source: Home / Self Care | Attending: Cardiothoracic Surgery

## 2016-12-03 DIAGNOSIS — I2510 Atherosclerotic heart disease of native coronary artery without angina pectoris: Secondary | ICD-10-CM

## 2016-12-03 DIAGNOSIS — I25110 Atherosclerotic heart disease of native coronary artery with unstable angina pectoris: Secondary | ICD-10-CM

## 2016-12-03 HISTORY — PX: RIGHT/LEFT HEART CATH AND CORONARY ANGIOGRAPHY: CATH118266

## 2016-12-03 LAB — GLUCOSE, CAPILLARY
GLUCOSE-CAPILLARY: 321 mg/dL — AB (ref 65–99)
GLUCOSE-CAPILLARY: 239 mg/dL — AB (ref 65–99)
Glucose-Capillary: 252 mg/dL — ABNORMAL HIGH (ref 65–99)
Glucose-Capillary: 236 mg/dL — ABNORMAL HIGH (ref 65–99)
Glucose-Capillary: 332 mg/dL — ABNORMAL HIGH (ref 65–99)

## 2016-12-03 LAB — BASIC METABOLIC PANEL
Anion gap: 11 (ref 5–15)
BUN: 19 mg/dL (ref 6–20)
CHLORIDE: 97 mmol/L — AB (ref 101–111)
CO2: 25 mmol/L (ref 22–32)
Calcium: 9.2 mg/dL (ref 8.9–10.3)
Creatinine, Ser: 0.97 mg/dL (ref 0.61–1.24)
GFR calc Af Amer: >60 mL/min (ref >60)
GFR calc non Af Amer: >60 mL/min (ref >60)
Glucose, Bld: 269 mg/dL — ABNORMAL HIGH (ref 65–99)
POTASSIUM: 4.1 mmol/L (ref 3.5–5.1)
SODIUM: 133 mmol/L — AB (ref 135–145)

## 2016-12-03 LAB — CBC
HEMATOCRIT: 41.3 % (ref 39.0–52.0)
HEMOGLOBIN: 14.5 g/dL (ref 13.0–17.0)
MCH: 29.4 pg (ref 26.0–34.0)
MCHC: 35.1 g/dL (ref 30.0–36.0)
MCV: 83.8 fL (ref 78.0–100.0)
Platelets: 271 10*3/uL (ref 150–400)
RBC: 4.93 MIL/uL (ref 4.22–5.81)
RDW: 13.5 % (ref 11.5–15.5)
WBC: 7.5 10*3/uL (ref 4.0–10.5)

## 2016-12-03 LAB — POCT I-STAT 3, VENOUS BLOOD GAS (G3P V)
Acid-base deficit: 2 mmol/L (ref 0.0–2.0)
BICARBONATE: 24.8 mmol/L (ref 20.0–28.0)
Bicarbonate: 21.4 mmol/L (ref 20.0–28.0)
O2 Saturation: 57 %
O2 Saturation: 94 %
PCO2 VEN: 33.8 mmHg — AB (ref 44.0–60.0)
PCO2 VEN: 39.8 mmHg — AB (ref 44.0–60.0)
PH VEN: 7.403 (ref 7.250–7.430)
PH VEN: 7.41 (ref 7.250–7.430)
PO2 VEN: 30 mmHg — AB (ref 32.0–45.0)
PO2 VEN: 70 mmHg — AB (ref 32.0–45.0)
TCO2: 26 mmol/L (ref 0–100)
TCO2: 22 mmol/L (ref 0–100)

## 2016-12-03 LAB — POCT ACTIVATED CLOTTING TIME: Activated Clotting Time: 114 seconds

## 2016-12-03 LAB — HEPARIN LEVEL (UNFRACTIONATED): HEPARIN UNFRACTIONATED: 0.51 [IU]/mL (ref 0.30–0.70)

## 2016-12-03 SURGERY — RIGHT/LEFT HEART CATH AND CORONARY ANGIOGRAPHY
Anesthesia: LOCAL

## 2016-12-03 MED ORDER — LIDOCAINE HCL (PF) 1 % IJ SOLN
INTRAMUSCULAR | Status: AC
  Filled 2016-12-03: qty 30

## 2016-12-03 MED ORDER — HEPARIN (PORCINE) IN NACL 2-0.9 UNIT/ML-% IJ SOLN
INTRAMUSCULAR | Status: AC | PRN
  Administered 2016-12-03: 1000 mL

## 2016-12-03 MED ORDER — IPRATROPIUM-ALBUTEROL 0.5-2.5 (3) MG/3ML IN SOLN
3.0000 mL | Freq: Two times a day (BID) | RESPIRATORY_TRACT | Status: DC
  Administered 2016-12-03 – 2016-12-06 (×7): 3 mL via RESPIRATORY_TRACT
  Filled 2016-12-03 (×9): qty 3

## 2016-12-03 MED ORDER — SODIUM CHLORIDE 0.9 % WEIGHT BASED INFUSION: 1.0000 mL/kg/h | INTRAVENOUS | Status: AC

## 2016-12-03 MED ORDER — HEPARIN (PORCINE) IN NACL 100-0.45 UNIT/ML-% IJ SOLN
1450.0000 [IU]/h | INTRAMUSCULAR | Status: DC
  Administered 2016-12-03 – 2016-12-06 (×5): 1450 [IU]/h via INTRAVENOUS
  Filled 2016-12-03 (×5): qty 250

## 2016-12-03 MED ORDER — IOPAMIDOL (ISOVUE-370) INJECTION 76%
INTRAVENOUS | Status: AC
  Filled 2016-12-03: qty 100

## 2016-12-03 MED ORDER — IOPAMIDOL (ISOVUE-370) INJECTION 76%
INTRAVENOUS | Status: DC | PRN
  Administered 2016-12-03: 75 mL via INTRA_ARTERIAL

## 2016-12-03 MED ORDER — CARVEDILOL 3.125 MG PO TABS
6.2500 mg | ORAL_TABLET | Freq: Two times a day (BID) | ORAL | Status: DC
  Administered 2016-12-04 – 2016-12-06 (×6): 6.25 mg via ORAL
  Filled 2016-12-03 (×6): qty 2

## 2016-12-03 MED ORDER — LIDOCAINE HCL (PF) 1 % IJ SOLN
INTRAMUSCULAR | Status: DC | PRN
  Administered 2016-12-03: 15 mL

## 2016-12-03 MED ORDER — FENTANYL CITRATE (PF) 100 MCG/2ML IJ SOLN
INTRAMUSCULAR | Status: DC | PRN
  Administered 2016-12-03: 25 ug via INTRAVENOUS

## 2016-12-03 MED ORDER — SODIUM CHLORIDE 0.9% FLUSH: 3.0000 mL | INTRAVENOUS | Status: DC | PRN

## 2016-12-03 MED ORDER — LOSARTAN POTASSIUM 25 MG PO TABS
25.0000 mg | ORAL_TABLET | Freq: Every day | ORAL | Status: DC
  Administered 2016-12-03 – 2016-12-06 (×4): 25 mg via ORAL
  Filled 2016-12-03 (×4): qty 1

## 2016-12-03 MED ORDER — MIDAZOLAM HCL 2 MG/2ML IJ SOLN
INTRAMUSCULAR | Status: DC | PRN
  Administered 2016-12-03: 1 mg via INTRAVENOUS

## 2016-12-03 MED ORDER — FENTANYL CITRATE (PF) 100 MCG/2ML IJ SOLN
INTRAMUSCULAR | Status: AC
  Filled 2016-12-03: qty 2

## 2016-12-03 MED ORDER — SODIUM CHLORIDE 0.9 % IV SOLN: 250.0000 mL | INTRAVENOUS | Status: DC | PRN

## 2016-12-03 MED ORDER — SODIUM CHLORIDE 0.9% FLUSH
3.0000 mL | Freq: Two times a day (BID) | INTRAVENOUS | Status: DC
  Administered 2016-12-04 – 2016-12-06 (×4): 3 mL via INTRAVENOUS

## 2016-12-03 MED ORDER — SODIUM CHLORIDE 0.9 % IV SOLN
INTRAVENOUS | Status: AC | PRN
  Administered 2016-12-03: 250 mL
  Administered 2016-12-03: 100 mL/h via INTRAVENOUS

## 2016-12-03 MED ORDER — CARVEDILOL 6.25 MG PO TABS
6.2500 mg | ORAL_TABLET | Freq: Two times a day (BID) | ORAL | Status: DC
  Administered 2016-12-03: 6.25 mg via ORAL
  Filled 2016-12-03: qty 1

## 2016-12-03 MED ORDER — MIDAZOLAM HCL 2 MG/2ML IJ SOLN
INTRAMUSCULAR | Status: AC
  Filled 2016-12-03: qty 2

## 2016-12-03 MED ORDER — HEPARIN (PORCINE) IN NACL 2-0.9 UNIT/ML-% IJ SOLN
INTRAMUSCULAR | Status: AC
  Filled 2016-12-03: qty 1000

## 2016-12-03 SURGICAL SUPPLY — 13 items
CATH INFINITI 5FR MULTPACK ANG (CATHETERS) ×2 IMPLANT
CATH LAUNCHER 5F NOTO (CATHETERS) ×1 IMPLANT
CATH SWAN GANZ 7F STRAIGHT (CATHETERS) ×2 IMPLANT
CATHETER LAUNCHER 5F NOTO (CATHETERS) ×2
COVER PRB 48X5XTLSCP FOLD TPE (BAG) ×1 IMPLANT
COVER PROBE 5X48 (BAG) ×2
KIT HEART LEFT (KITS) ×2 IMPLANT
PACK CARDIAC CATHETERIZATION (CUSTOM PROCEDURE TRAY) ×2 IMPLANT
SHEATH PINNACLE 5F 10CM (SHEATH) ×2 IMPLANT
SHEATH PINNACLE 7F 10CM (SHEATH) ×2 IMPLANT
SYR MEDRAD MARK V 150ML (SYRINGE) ×2 IMPLANT
TRANSDUCER W/STOPCOCK (MISCELLANEOUS) ×2 IMPLANT
WIRE EMERALD 3MM-J .035X150CM (WIRE) ×2 IMPLANT

## 2016-12-03 NOTE — H&P (View-Only) (Signed)
Progress Note  Patient Name: Grant Moran Date of Encounter: 12/02/2016  Primary Cardiologist: Nahser  Subjective   NO complaints of chest pain. Dyspnea when he gets up to go to the bathroom.   Inpatient Medications    Scheduled Meds: . aspirin EC  81 mg Oral Daily  . atorvastatin  80 mg Oral Daily  . carvedilol  12.5 mg Oral BID WC  . clopidogrel  75 mg Oral Daily  . fenofibrate  160 mg Oral Daily  . furosemide  80 mg Intravenous TID  . guaiFENesin  1,200 mg Oral BID  . insulin aspart  0-20 Units Subcutaneous TID WC  . insulin aspart  0-5 Units Subcutaneous QHS  . insulin glargine  20 Units Subcutaneous QHS  . insulin starter kit- pen needles  1 kit Other Once  . ipratropium-albuterol  3 mL Nebulization TID  . potassium chloride  20 mEq Oral BID  . sacubitril-valsartan  1 tablet Oral BID  . sodium chloride flush  3 mL Intravenous Q12H   Continuous Infusions: . sodium chloride    . azithromycin Stopped (12/01/16 1142)  . cefTRIAXone (ROCEPHIN)  IV Stopped (12/01/16 1022)  . heparin 1,400 Units/hr (12/02/16 0804)   PRN Meds: sodium chloride, acetaminophen, calcium carbonate, ipratropium-albuterol, nitroGLYCERIN, ondansetron (ZOFRAN) IV, sodium chloride flush   Vital Signs    Vitals:   12/01/16 2026 12/01/16 2029 12/02/16 0500 12/02/16 0753  BP: (!) 123/47  119/67   Pulse: 85  86   Resp: 18  16   Temp:   98 F (36.7 C)   TempSrc:   Oral   SpO2: 92% 92% 95% 96%  Weight:   165 lb 3.2 oz (74.9 kg)   Height:        Intake/Output Summary (Last 24 hours) at 12/02/16 0829 Last data filed at 12/02/16 0500  Gross per 24 hour  Intake           983.87 ml  Output             3300 ml  Net         -2316.13 ml   Filed Weights   11/30/16 0621 12/01/16 0441 12/02/16 0500  Weight: 170 lb 8 oz (77.3 kg) 168 lb 8 oz (76.4 kg) 165 lb 3.2 oz (74.9 kg)    Telemetry    NSR with intraventricular conduction delay, Heart Rate in the 80's. .  Personally Reviewed   Physical  Exam   GEN: No acute distress.   Neck: No JVD Cardiac: RRR, tachycardic,  no murmurs, rubs, or gallops.  Respiratory: Clear to auscultation bilaterally. GI: Soft, nontender, non-distended  MS: No edema; No deformity. Neuro:  Nonfocal  Psych: Normal affect   Labs    Chemistry Recent Labs Lab 11/29/16 0936  11/30/16 0503 12/01/16 0757 12/01/16 1257 12/02/16 0645  NA 138  < > 135 135  --  133*  K 3.4*  < > 3.6 3.9  --  4.0  CL 103  < > 102 101  --  99*  CO2 23  < > 23 24  --  25  GLUCOSE 232*  < > 209* 216* 439* 266*  BUN 12  < > 13 11  --  16  CREATININE 0.73  < > 0.64 0.63  --  0.82  CALCIUM 9.1  < > 8.1* 8.7*  --  8.9  PROT 6.8  --   --   --   --   --   ALBUMIN  3.7  --   --   --   --   --   AST 21  --   --   --   --   --   ALT 16*  --   --   --   --   --   ALKPHOS 70  --   --   --   --   --   BILITOT 0.9  --   --   --   --   --   GFRNONAA >60  < > >60 >60  --  >60  GFRAA >60  < > >60 >60  --  >60  ANIONGAP 12  < > 10 10  --  9  < > = values in this interval not displayed.   Hematology Recent Labs Lab 11/30/16 0503 12/01/16 0757 12/02/16 0645  WBC 7.7 8.7 7.3  RBC 4.26 4.38 4.70  HGB 12.5* 12.9* 13.5  HCT 36.8* 37.5* 40.2  MCV 86.4 85.6 85.5  MCH 29.3 29.5 28.7  MCHC 34.0 34.4 33.6  RDW 13.7 13.6 13.4  PLT 231 232 266    Cardiac Enzymes Recent Labs Lab 11/29/16 0936 11/29/16 1337 11/29/16 1940 11/30/16 0024  TROPONINI 0.49* 0.55* 0.53* 0.50*   No results for input(s): TROPIPOC in the last 168 hours.   BNP Recent Labs Lab 11/29/16 0936 11/30/16 0503  BNP 1,413.0* 389.0*     DDimer  Recent Labs Lab 11/29/16 0936  DDIMER 0.28     Radiology    Dg Chest 2 View  Result Date: 11/30/2016 CLINICAL DATA:  Shortness of breath EXAM: CHEST  2 VIEW COMPARISON:  11/29/2016 FINDINGS: Progressive interstitial opacities with Awanda Mink lines. Right perihilar opacity is less asymmetric appearing today. No effusion. Normal heart size. Spondylosis. No acute  osseous finding. IMPRESSION: Mildly progressive interstitial opacities. More [employer] are seen, this may be edema instead of atypical infection. Electronically Signed   By: Monte Fantasia M.D.   On: 11/30/2016 09:39    Cardiac Studies   Echocardiogram 11/30/2016 Left ventricle: Diffuse hypokinesis worse in the inferior wall   and septum The cavity size was moderately dilated. Wall thickness   was increased in a pattern of moderate LVH. The estimated   ejection fraction was 30%. Left ventricular diastolic function   parameters were normal. - Mitral valve: There was mild regurgitation. - Left atrium: The atrium was moderately dilated. - Atrial septum: No defect or patent foramen ovale was identified. - Pulmonary arteries: PA peak pressure: 40 mm Hg (S).  Patient Profile     61 y.o. male with CAD (IMI 01/2000 s/p BMS to RCA, restenosis tx with angioplasty/brachytherapy 81/8299), chronic systolic CHF (prior EF 37-16%) , CVA, HTN, uncontrolled DM with A1C 12.2, HLD, prior gastric ulcers admitted with dyspnea/orthopnea, elevated troponin, suspected a/c systolic CHF and possible PNA. Last EF assessment by TEE in 04/2015 was 40-45% (same admission was 35-40% by 2D echo).  Assessment & Plan    1. Acute on chronic systolic CHF:  Overall improvement in symptoms without complaints of recurrent dyspnea at rest, but continues to have DOE. Creatinine this am 0.82. He continues to diurese on 80 mg TID, with output of 3,300 liters overnight with total of 2.559 liters since admission.   2. CADwith NSTEMI:  Troponin is stable. With reduced EF and known multi-vessel CAD have discussed with him cardiac cath. This was also discussed yesterday by Dr. Harl Bowie. He is willing to proceed with transfer to [facility] and undergo procedure.  He is NPO. Orders are written for cath today. Reinforced cath procedure, risks and benefits as discussed and documented yesterday. He verbalizes understanding.   3. Diabetes: Continue  care per PCP team.  Signed, Phill Myron. West Pugh, ANP, AACC   12/02/2016, 8:29 AM    Patient seen and discussed with DNP Purcell Nails, I agree with her documentation above. With drop in LVEF we will transfer to [facility] for LHC/RHC. Yesterday we increased lasix to 12m tid, much better diuresis, negative 2.3 liters yesterday and negative 2.6 liters since admission. Mild uptrend in Cr, continue IV diuretics today, follow up filling pressures from [facility] and adjust accordingly. He was also changed from ARB to entresto starting today, continue beta blocker. Over time can consider aldactone.   JCarlyle DollyMD

## 2016-12-03 NOTE — Interval H&P Note (Signed)
History and Physical Interval Note:  12/03/2016 7:28 AM  Grant Moran  has presented today for surgery, with the diagnosis of chf - nstemi  The various methods of treatment have been discussed with the patient and family. After consideration of risks, benefits and other options for treatment, the patient has consented to  Procedure(s): RIGHT/LEFT HEART CATH AND CORONARY ANGIOGRAPHY (N/A) as a surgical intervention .  The patient's history has been reviewed, patient examined, no change in status, stable for surgery.  I have reviewed the patient's chart and labs.  Questions were answered to the patient's satisfaction.    Cath Lab Visit (complete for each Cath Lab visit)  Clinical Evaluation Leading to the Procedure:   ACS: Yes.    Non-ACS:    Anginal Classification: CCS III  Anti-ischemic medical therapy: Maximal Therapy (2 or more classes of medications)  Non-Invasive Test Results: No non-invasive testing performed  Prior CABG: No previous CABG       Shandreka Dante Martinique MD,FACC 12/03/2016 7:28 AM

## 2016-12-03 NOTE — Plan of Care (Signed)
Problem: Skin Integrity: Goal: Risk for impaired skin integrity will decrease Outcome: Progressing Pt educated about rotating insulin injection site to aid in absorption and prevent scar tissue build up

## 2016-12-03 NOTE — Consult Note (Signed)
[address]       ,[place] [address]             [phone]        Hendryx R Cooler [place] Medical Record #[id number] Date of Birth: 1956-02-28  Referring: No ref. provider found Primary Care: System, Provider Not In  Chief Complaint:    Chief Complaint  Patient presents with  . 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 He notes sudden worsening sob after getting to work and went to [facility] ER. He has history of mild SOB with exertion previously  Occasionally at night. He had percutaneous intervention on   right coronary 2001 .  He has  medical history significant of diabetes mellitus- poorly controlled , hyperlipidemia, hypertension, history of CVA with no residual weakness.   2-D echo from 05/02/2015 with a EF of 61-60%, grade 1 diastolic dysfunction, history of MI 2001 with ulcerated lesion in the RCA status post BMS with restenosis and treatment with angioplasty and brachytherapy 04/21/2000 with residual 35M/PLA and proximal LAD disease 70%. Patient states shortness of breath occurring at rest and on exertion. Patient denies any fevers, no chills, no syncope, no dizziness, no lightheadedness, no chest pain, no diarrhea, no constipation, no melena, no hematemesis, no hematochezia, no sick contacts, no lower extremity edema, no recent weight gain, no recent TRAVEL, no recent surgeries.  In  the ED with comprehensive metabolic profile with a potassium of 3.4, glucose of 232 otherwise was within normal limits. BNP was elevated at 1413. First set of troponin was elevated at 0.49. CBC done was unremarkable. D-dimer was 0.28. Chest x-ray done showed bronchitic type markings, patchy reticular density about the right hilum could be atelectasis or bronchopneumonia. Patient given a dose of IV Rocephin and azithromycin. Admitted Monday to [facility] and then transferred to [facility].   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   Zubrod Score: At the time of surgery this patient's most appropriate activity status/level should be described as: _0     0    Normal activity, no symptoms _1     1    Restricted in physical strenuous activity but ambulatory, able to do out light work _2     2    Ambulatory and capable of self care, unable to do work activities, up and about                 more than 50%  Of the time                            _3     3    Only limited self care, in bed greater than 50% of waking hours _4     4    Completely disabled, no self care, confined to bed or chair _5     5    Moribund  Past Medical History:  Diagnosis Date  . CAD (coronary artery disease)   . Diabetes mellitus ([place])   . Hyperlipidemia   . Hypertension   . Myocardial infarction ([place])    OCT 2001-He under went angioplasty  and  stenting  of the RCA and distal RCA   .  Stroke [facility])     Past Surgical History:  Procedure Laterality Date  . ANKLE SURGERY     Trauma  . fx left arm    . RIGHT/LEFT HEART CATH AND CORONARY ANGIOGRAPHY N/A 12/03/2016   Procedure: RIGHT/LEFT HEART CATH AND CORONARY ANGIOGRAPHY;  Surgeon: Martinique, Peter M, MD;  Location: [facility];  Service: Cardiovascular;  Laterality: N/A;  . stent sx    . TEE WITHOUT CARDIOVERSION N/A 05/07/2015   Procedure: TRANSESOPHAGEAL ECHOCARDIOGRAM (TEE);  Surgeon: Thayer Headings, MD;  Location: [facility] ENDOSCOPY;  Service: Cardiovascular;  Laterality: N/A;    History  Smoking Status  . Former Smoker  . Packs/day: 2.00  . Years: 20.00  . Types: Cigarettes  . Quit date: 06/18/1990  Smokeless Tobacco  . Never Used    History  Alcohol Use  . 0.6 oz/week  . 1 Shots of liquor per week    Comment: occasioinally    Social History   Social History  . Marital status:  Single    Spouse name: N/A  . Number of children: 0  . Years of education: N/A   Occupational History  . 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 Smoking status: Former Smoker    Packs/day: 2.00    Years: 20.00    Types: Cigarettes    Quit date: 06/18/1990  . Smokeless tobacco: Never Used  . Alcohol use 0.6 oz/week    1 Shots of liquor per week     Comment: occasioinally  . Drug use: No  . Sexual activity: Not Currently   Other Topics Concern  . Not on file   Social History Narrative   Notable for remote tobacco abuse, with a prior three-pack per - day - history  For 20 years . He  Has not smoked since 1992.    No Known Allergies  Current Facility-Administered Medications  Medication Dose Route Frequency Provider Last Rate Last Dose  . 0.9 %  sodium chloride infusion  250 mL Intravenous PRN Eugenie Filler, MD      . 0.9 %  sodium chloride infusion  250 mL Intravenous PRN Martinique, Peter M, MD      . 0.9% sodium chloride infusion  1 mL/kg/hr Intravenous Continuous Martinique, Peter M, MD 71.4 mL/hr at 12/03/16 0851 1 mL/kg/hr at 12/03/16 0851  . acetaminophen (TYLENOL) tablet 650 mg  650 mg Oral Q4H PRN Eugenie Filler, MD      . aspirin EC tablet 81 mg  81 mg Oral Daily Eugenie Filler, MD   81 mg at 12/02/16 3546  . atorvastatin (LIPITOR) tablet 80 mg  80 mg Oral Daily Eugenie Filler, MD   80 mg at 12/03/16 0940  . azithromycin (ZITHROMAX) 500 mg in dextrose 5 % 250 mL IVPB  500 mg Intravenous Q24H Eugenie Filler, MD   Stopped at 12/02/16 1107  . calcium carbonate (TUMS - dosed in mg elemental calcium) chewable tablet 400 mg of elemental calcium  2 tablet Oral Daily PRN Eugenie Filler, MD      . carvedilol (COREG) tablet 6.25 mg  6.25 mg Oral BID WC Martinique, Peter M, MD      . cefTRIAXone (ROCEPHIN) 1 g in dextrose 5 % 50 mL IVPB  1 g Intravenous Q24H Eugenie Filler, MD   Stopped at 12/02/16 0945  . fenofibrate tablet 160 mg  160 mg Oral Daily  Eugenie Filler, MD   160 mg at  12/03/16 0940  . guaiFENesin (MUCINEX) 12 hr tablet 1,200 mg  1,200 mg Oral BID Eugenie Filler, MD   1,200 mg at 12/03/16 0940  . heparin ADULT infusion 100 units/mL (25000 units/2105m sodium chloride 0.45%)  1,450 Units/hr Intravenous Continuous RSkeet Latch MD      . insulin aspart (novoLOG) injection 0-20 Units  0-20 Units Subcutaneous TID WC FRexene Alberts MD   4 Units at 12/02/16 1747  . insulin aspart (novoLOG) injection 0-5 Units  0-5 Units Subcutaneous QHS FRexene Alberts MD   4 Units at 12/02/16 2241  . insulin glargine (LANTUS) injection 20 Units  20 Units Subcutaneous QHS FRexene Alberts MD   20 Units at 12/03/16 0001  . insulin starter kit- pen needles (English) 1 kit  1 kit Other Once TEugenie Filler MD      . ipratropium-albuterol (DUONEB) 0.5-2.5 (3) MG/3ML nebulizer solution 3 mL  3 mL Nebulization Q2H PRN TEugenie Filler MD      . ipratropium-albuterol (DUONEB) 0.5-2.5 (3) MG/3ML nebulizer solution 3 mL  3 mL Nebulization TID TEugenie Filler MD   3 mL at 12/02/16 1928  . losartan (COZAAR) tablet 25 mg  25 mg Oral Daily JMartinique Peter M, MD   25 mg at 12/03/16 0940  . nitroGLYCERIN (NITROSTAT) SL tablet 0.4 mg  0.4 mg Sublingual Q5 min PRN TEugenie Filler MD      . ondansetron ([facility] injection 4 mg  4 mg Intravenous Q6H PRN TEugenie Filler MD      . potassium chloride SA (K-DUR,KLOR-CON) CR tablet 20 mEq  20 mEq Oral BID NJosue Hector MD   20 mEq at 12/03/16 0940  . sodium chloride flush (NS) 0.9 % injection 3 mL  3 mL Intravenous Q12H TEugenie Filler MD   3 mL at 12/02/16 0915  . sodium chloride flush (NS) 0.9 % injection 3 mL  3 mL Intravenous PRN TEugenie Filler MD      . sodium chloride flush (NS) 0.9 % injection 3 mL  3 mL Intravenous Q12H JMartinique Peter M, MD      . sodium chloride flush (NS) 0.9 % injection 3 mL  3 mL Intravenous PRN JMartinique Peter M, MD        Prescriptions Prior to Admission    Medication Sig Dispense Refill Last Dose  . atorvastatin (LIPITOR) 80 MG tablet Take 1 tablet by mouth daily.  3 11/29/2016 at Unknown time  . calcium carbonate (TUMS - DOSED IN MG ELEMENTAL CALCIUM) 500 MG chewable tablet Chew 2 tablets by mouth daily as needed for indigestion or heartburn.   unknown  . clopidogrel (PLAVIX) 75 MG tablet Take 1 tablet (75 mg total) by mouth daily. 90 tablet 3 11/29/2016 at Unknown time  . fenofibrate 160 MG tablet Take 160 mg by mouth daily.   11/29/2016 at Unknown time  . glyBURIDE-metformin (GLUCOVANCE) 5-500 MG tablet Take 1 tablet by mouth 2 (two) times daily.   11/29/2016 at Unknown time  . INVOKANA 300 MG TABS tablet Take 300 mg by mouth daily.  0 11/29/2016 at Unknown time  . losartan (COZAAR) 50 MG tablet Take 1 tablet (50 mg total) by mouth daily. 30 tablet 0 11/29/2016 at Unknown time  . metoprolol succinate (TOPROL-XL) 100 MG 24 hr tablet Take 100 mg by mouth daily. Take with or immediately following a meal.   11/29/2016 at 0430  . nitroGLYCERIN (NITROSTAT) 0.4 MG SL tablet Place 0.4 mg under the tongue  every 5 (five) minutes as needed for chest pain.    unknown  . sitaGLIPtin (JANUVIA) 100 MG tablet Take by mouth.   11/29/2016 at Unknown time    Family History  Problem Relation Age of Onset  . Cancer Mother        Colon  . Heart attack Father        CABG  . Heart attack Sister   . Heart attack Maternal Grandfather   . Heart attack Paternal Grandfather      Review of Systems:  Pertinent items are noted in HPI.     Cardiac Review of Systems: Y or N  Chest Pain [ y   ]  Resting SOB [  y ] Exertional SOB  Blue.Reese  ]  Orthopnea [ n ]   Pedal Edema [  n ]    Palpitations [n  ] Syncope  [ n ]   Presyncope [ n  ]  General Review of Systems: [Y] = yes [  ]=no Constitional: recent weight change [  ]; anorexia [  ]; fatigue Blue.Reese  ]; nausea [  ]; night sweats [  ]; fever [  ]; or chills [  ]                                                               Dental: poor  dentition[  ]; Last Dentist visit:   Eye : blurred vision [ n ]; diplopia [   ]; vision changes [n  ];  Amaurosis fugax[n  ]; Resp: cough [n  ];  wheezing[n  ];  hemoptysis[n  ]; shortness of breath[ y ]; paroxysmal nocturnal dyspnea[y  ]; dyspnea on exertion[ y ]; or orthopnea[  ];  GI:  gallstones[  ], vomiting[  ];  dysphagia[  ]; melena[  ];  hematochezia [n  ]; heartburn[  ];   Hx of  Colonoscopy[  ]; GU: kidney stones [ n ]; hematuria[n  ];   dysuria [ n ];  nocturia[  ];  history of     obstruction [ n ]; urinary frequency [  ]             Skin: rash, swelling[  ];, hair loss[  ];  peripheral edema[  ];  or itching[  ]; Musculosketetal: myalgias[  ];  joint swelling[  ];  joint erythema[  ];  joint pain[  ];  back pain[  ];  Heme/Lymph: bruising[  ];  bleeding[  ];  anemia[  ];  Neuro: TIA[ n ];  headaches[  ];  stroke[ n ];  vertigo[  ];  seizures[ n ];   paresthesias[  ];  difficulty walking[  ];  Psych:depression[  ]; anxiety[  ];  Endocrine: diabetes[ y ];  thyroid dysfunction[ n ];  Immunizations: Flu [  n]; Pneumococcal[ n ];  Other:  Physical Exam: BP 102/63   Pulse 76   Temp 98.6 F (37 C) (Oral)   Resp 19   Ht _0  (1.6 m)   Wt 157 lb 6.4 oz (71.4 kg)   SpO2 94%   BMI 27.88 kg/m    General appearance: alert, cooperative, appears older than stated age and no distress Head: Normocephalic, without obvious abnormality, atraumatic Neck: no adenopathy, no carotid  bruit, no JVD, supple, symmetrical, trachea midline and thyroid not enlarged, symmetric, no tenderness/mass/nodules Lymph nodes: Cervical, supraclavicular, and axillary nodes normal. Resp: diminished breath sounds bibasilar Back: symmetric, no curvature. ROM normal. No CVA tenderness. Cardio: regular rate and rhythm, S1, S2 normal, no murmur, click, rub or gallop GI: soft, non-tender; bowel sounds normal; no masses,  no organomegaly Extremities: extremities normal, atraumatic, no cyanosis or edema and right  groin cath site intact,  Neurologic: Grossly normal No palpable pedal pulses, no palpable right radial pulse   Diagnostic Studies & Laboratory data:     Recent Radiology Findings:  Dg Chest 2 View  Result Date: 11/30/2016 CLINICAL DATA:  Shortness of breath EXAM: CHEST  2 VIEW COMPARISON:  11/29/2016 FINDINGS: Progressive interstitial opacities with Kerley lines. Right perihilar opacity is less asymmetric appearing today. No effusion. Normal heart size. Spondylosis. No acute osseous finding. IMPRESSION: Mildly progressive interstitial opacities. More [employer] are seen, this may be edema instead of atypical infection. Electronically Signed   By: Monte Fantasia M.D.   On: 11/30/2016 09:39    I have independently reviewed the above radiologic studies.  Recent Lab Findings: Lab Results  Component Value Date   WBC 7.5 12/03/2016   HGB 14.5 12/03/2016   HCT 41.3 12/03/2016   PLT 271 12/03/2016   GLUCOSE 269 (H) 12/03/2016   CHOL 47 11/30/2016   TRIG 75 11/30/2016   HDL 21 (L) 11/30/2016   LDLCALC 11 11/30/2016   ALT 16 (L) 11/29/2016   AST 21 11/29/2016   NA 133 (L) 12/03/2016   K 4.1 12/03/2016   CL 97 (L) 12/03/2016   CREATININE 0.97 12/03/2016   BUN 19 12/03/2016   CO2 25 12/03/2016   TSH 3.158 11/29/2016   INR 1.08 11/30/2016   HGBA1C 12.2 (H) 11/29/2016  CATH:  RIGHT/LEFT HEART CATH AND CORONARY ANGIOGRAPHY  Conclusion     Mid RCA lesion, 30 %stenosed.  Dist RCA lesion, 0 %stenosed.  Ost RCA lesion, 95 %stenosed.  Prox RCA lesion, 90 %stenosed.  There is severe left ventricular systolic dysfunction.  LV end diastolic pressure is normal.  The left ventricular ejection fraction is 25-35% by visual estimate.  There is trivial (1+) mitral regurgitation.  Ost LAD to Prox LAD lesion, 80 %stenosed.  Mid LAD to Dist LAD lesion, 70 %stenosed.  Prox Cx to Mid Cx lesion, 100 %stenosed.  Ost Cx lesion, 50 %stenosed.  LV end diastolic pressure is normal.     1. Severe 3 vessel obstructive CAD 2. Severe LV dysfunction. WF estimated at 25-30%. 3. Low LV and right heart filling pressures and systemic hypotension c/w volume depletion.  4. Normal right heart pressures   Plan: Recommend CABG. Patient has 3 vessel disease and severe LV dysfunction in a diabetic. Will hold Plavix. Resume Heparin after sheath pull. Hold diuretics. Will switch Entresto to low dose losartan and reduce Coreg due to hypotension. IV hydration post cath.    I have independently reviewed the above  cath films and reviewed the findings with the  patient .   ECHO: Transthoracic Echocardiography  Patient:    Grant Moran, Grant Moran MR #:       [id number] Study Date: 11/30/2016 Gender:     M Age:        19 Height:     160 cm Weight:     77.3 kg BSA:        1.88 m^2 Pt. Status: Room:       A328  ADMITTING    Burman Nieves  SONOGRAPHER  Leavy Cella  PERFORMING   Chmg, Forestine Na  cc:  ------------------------------------------------------------------- LV EF: 30%  ------------------------------------------------------------------- Indications:      CHF - 428.0.  ------------------------------------------------------------------- History:   PMH:  Elevated Troponin.  Coronary artery disease. Congestive heart failure.  Stroke.  Risk factors:  Hypertension. Diabetes mellitus. Dyslipidemia.  ------------------------------------------------------------------- Study Conclusions  - Left ventricle: Diffuse hypokinesis worse in the inferior wall   and septum The cavity size was moderately dilated. Wall thickness   was increased in a pattern of moderate LVH. The estimated   ejection fraction was 30%. Left ventricular diastolic function   parameters were normal. - Mitral valve: There was mild regurgitation. - Left atrium: The atrium was moderately dilated. -  Atrial septum: No defect or patent foramen ovale was identified. - Pulmonary arteries: PA peak pressure: 40 mm Hg (S).  ------------------------------------------------------------------- Study data:  Comparison was made to the study of 05/02/2015.  Study status:  Routine.  Procedure:  Transthoracic echocardiography. Image quality was adequate.          Transthoracic echocardiography.  M-mode, complete 2D, spectral Doppler, and color Doppler.  Birthdate:  Patient birthdate: 06-Jul-1955.  Age:  Patient is 61 yr old.  Sex:  Gender: male.    BMI: 30.2 kg/m^2.  Blood pressure:     122/80  Patient status:  Outpatient.  Study date: Study date: 11/30/2016. Study time: 10:44 AM.  Location:  Echo laboratory.  -------------------------------------------------------------------  ------------------------------------------------------------------- Left ventricle:  Diffuse hypokinesis worse in the inferior wall and septum The cavity size was moderately dilated. Wall thickness was increased in a pattern of moderate LVH. The estimated ejection fraction was 30%. The transmitral flow pattern was normal. The deceleration time of the early transmitral flow velocity was normal. The pulmonary vein flow pattern was normal. The tissue Doppler parameters were normal. Left ventricular diastolic function parameters were normal.  ------------------------------------------------------------------- Aortic valve:   Trileaflet; mildly thickened, mildly calcified leaflets.  Doppler:   There was no stenosis.  ------------------------------------------------------------------- Aorta:  The aorta was normal, not dilated, and non-diseased.  ------------------------------------------------------------------- Mitral valve:   Mildly thickened leaflets .  Doppler:  There was mild regurgitation.    Peak gradient (D): 3 mm Hg.  ------------------------------------------------------------------- Left atrium:  The  atrium was moderately dilated.  ------------------------------------------------------------------- Atrial septum:  No defect or patent foramen ovale was identified.   ------------------------------------------------------------------- Right ventricle:  The cavity size was normal. Wall thickness was normal. Systolic function was normal.  ------------------------------------------------------------------- Pulmonic valve:    Doppler:  There was mild regurgitation.  ------------------------------------------------------------------- Tricuspid valve:   Doppler:  There was mild regurgitation.  ------------------------------------------------------------------- Right atrium:  The atrium was normal in size.  ------------------------------------------------------------------- Pericardium:  The pericardium was normal in appearance.  ------------------------------------------------------------------- Systemic veins: Inferior vena cava: The vessel was normal in size. The respirophasic diameter changes were in the normal range (>= 50%), consistent with normal central venous pressure.  ------------------------------------------------------------------- Post procedure conclusions Ascending Aorta:  - The aorta was normal, not dilated, and non-diseased.  ------------------------------------------------------------------- Measurements   Left ventricle                             Value        Reference  LV ID, ED, PLAX chordal            (  L)     41.1  mm     43 - 52  LV ID, ES, PLAX chordal                    35.6  mm     23 - 38  LV fx shortening, PLAX chordal     (L)     13    %      >=29  LV PW thickness, ED                        14    mm     ----------  IVS/LV PW ratio, ED                        1.02         <=1.3  LV e&', lateral                             10.4  cm/s   ----------  LV E/e&', lateral                           8.35         ----------  LV e&', medial                               4.68  cm/s   ----------  LV E/e&', medial                            18.55        ----------  LV e&', average                             7.54  cm/s   ----------  LV E/e&', average                           11.51        ----------    Ventricular septum                         Value        Reference  IVS thickness, ED                          14.3  mm     ----------    LVOT                                       Value        Reference  LVOT ID, S                                 21    mm     ----------  LVOT area                                  3.46  cm^2   ----------  Aorta                                      Value        Reference  Aortic root ID, ED                         25    mm     ----------    Left atrium                                Value        Reference  LA ID, A-P, ES                             47    mm     ----------  LA ID/bsa, A-P                     (H)     2.5   cm/m^2 <=2.2  LA volume, S                               73.1  ml     ----------  LA volume/bsa, S                           38.9  ml/m^2 ----------  LA volume, ES, 1-p A4C                     63.1  ml     ----------  LA volume/bsa, ES, 1-p A4C                 33.6  ml/m^2 ----------  LA volume, ES, 1-p A2C                     72.2  ml     ----------  LA volume/bsa, ES, 1-p A2C                 38.4  ml/m^2 ----------    Mitral valve                               Value        Reference  Mitral E-wave peak velocity                86.8  cm/s   ----------  Mitral A-wave peak velocity                58.2  cm/s   ----------  Mitral deceleration time           (L)     113   ms     150 - 230  Mitral peak gradient, D                    3     mm Hg  ----------  Mitral E/A ratio, peak                     1.5          ----------  Mitral maximal regurg velocity,            424   cm/s   ----------  PISA  Mitral regurg VTI, PISA                    108   cm     ----------    Pulmonary arteries                          Value        Reference  PA pressure, S, DP                 (H)     40    mm Hg  <=30    Tricuspid valve                            Value        Reference  Tricuspid regurg peak velocity             306   cm/s   ----------  Tricuspid peak RV-RA gradient              37    mm Hg  ----------    Right atrium                               Value        Reference  RA ID, S-I, ES, A4C                        40.9  mm     34 - 49  RA area, ES, A4C                           14.7  cm^2   8.3 - 19.5  RA volume, ES, A/L                         44.2  ml     ----------  RA volume/bsa, ES, A/L                     23.5  ml/m^2 ----------    Systemic veins                             Value        Reference  Estimated CVP                              3     mm Hg  ----------    Right ventricle                            Value        Reference  TAPSE                                      15.6  mm     ----------  RV pressure, S, DP                 (  H)     40    mm Hg  <=30  RV s&', lateral, S                          9.64  cm/s   ----------  Legend: (L)  and  (H)  mark values outside specified reference range.  ------------------------------------------------------------------- Prepared and Electronically Authenticated by  Jenkins Rouge, M.D. 2018-08-07T16:31:47   Assessment / Plan:   Diabetes  mellitus with complication of coronary artery disease   poorly controlled diabetes  hgbA1c 12.2 3 vessel cad with depressed LV function- I have discussed with the patient the risks and options, and recommened proceeding with CABG after washout of plavix, ? Tuesday      I  spent 40 minutes counseling the patient face to face and 50% or more the  time was spent in counseling and coordination of care. The total time spent in the appointment was 60 minutes.    [name] MD      [address] [place] [address] Office [phone]   Beeper  [phone]  12/03/2016 11:14 AM

## 2016-12-03 NOTE — Progress Notes (Signed)
Inpatient Diabetes Program Recommendations  AACE/ADA: New Consensus Statement on Inpatient Glycemic Control (2015)  Target Ranges:  Prepandial:   less than 140 mg/dL      Peak postprandial:   less than 180 mg/dL (1-2 hours)      Critically ill patients:  140 - 180 mg/dL   Results for FATIH, STALVEY (MRN [id number]) as of 12/03/2016 08:43  Ref. Range 12/02/2016 07:46 12/02/2016 11:23 12/02/2016 17:14 12/02/2016 21:14 12/03/2016 06:48  Glucose-Capillary Latest Ref Range: 65 - 99 mg/dL 239 (H) 285 (H) 157 (H) 320 (H) 252 (H)  Results for JACEN, CARLINI (MRN [id number]) as of 12/03/2016 08:43  Ref. Range 11/29/2016 16:00  Hemoglobin A1C Latest Ref Range: 4.8 - 5.6 % 12.2 (H)   Review of Glycemic Control  Diabetes history: DM2 Outpatient Diabetes medications: Glucovance 5-500 mg BID, Invokana 300 mg daily, Januvia 100 mg daily Current orders for Inpatient glycemic control: Lantus 20units QHS, Novolog 0-20 units TID with meals, Novolog 0-5 units QHS  Inpatient Diabetes Program Recommendations:  Insulin - Basal: Please consider increasing Lantus to 25 units QHS. Insulin - Meal Coverage: Once diet is resumed, please consider ordering Novolog 4 units TID with meals for meal coverage.  HgbA1C: A1C 12.2% on 11/29/16 indicating an average glucose of 303 mg/dl over the past 2-3 months. Patient needs to follow up with PCP or Endocrinologist regarding improving DM control. Patient has been educated on insulin (patient prefers insulin pens) as he will likely be discharged new to insulin.  Thanks, Barnie Alderman, RN, MSN, CDE Diabetes Coordinator Inpatient Diabetes Program [phone] (Team Pager from 8am to 5pm)

## 2016-12-03 NOTE — Progress Notes (Signed)
ANTICOAGULATION CONSULT NOTE - Follow Up Consult  Pharmacy Consult for Heparin Indication: Chest pain/ACS;  S/p cardiac cath 12/03/16>>severe 3vessel CAD No Known Allergies  Patient Measurements: Height: 5\' 3"  (160 cm) Weight: 157 lb 6.4 oz (71.4 kg) IBW/kg (Calculated) : 56.9 Heparin Dosing Weight: 73 kg  Vital Signs: Temp: 98.6 F (37 C) (08/10 0606) Temp Source: Oral (08/10 0606) BP: 102/63 (08/10 0850) Pulse Rate: 76 (08/10 0850)  Labs:  Recent Labs  12/01/16 0757  12/02/16 0645 12/02/16 0648 12/02/16 2149 12/03/16 0332  HGB 12.9*  --  13.5  --   --  14.5  HCT 37.5*  --  40.2  --   --  41.3  PLT 232  --  266  --   --  271  HEPARINUNFRC  --   < >  --  0.28* 0.32 0.51  CREATININE 0.63  --  0.82  --   --  0.97  < > = values in this interval not displayed. Estimated Creatinine Clearance: 70.9 mL/min (by C-G formula based on SCr of 0.97 mg/dL).  Assessment: 61 yo male on IV heparin for r/o ACS. Heparin level was therapeutic at 0.51 this AM on heparin rate 1450 units/hr prior to cardiac cath.  Now S/p cardiac cath 12/03/16>>severe 3vessel CAD.  CABG recommended.  Pharmacy consulted to restart IV heparin drip 8 hr after sheath removal ; removed at 08:47am.    No bleeding noted. CBC is wnl.   Goal of Therapy:  Heparin level goal 0.3-0.7 units/ml Monitor platelets by anticoagulation protocol: Yes  Plan:  At 17:00 restart heparin infusion 1450 units/ml Check ~ 8 hour heparin level Daily Heparin Level and CBC   Thank you for allowing [place] to participate in this patients care. Nicole Cella, RPh Clinical Pharmacist Pager: [phone] 12/03/2016 10:20 AM

## 2016-12-03 NOTE — Progress Notes (Signed)
5 FR arterial and 7 FR venous sheaths removed and manual pressure held x 20 minutes without complication. Site is a level zero and this time and the patient was given and verbalized understanding of instructions at this time. Site dressed per protocol.

## 2016-12-04 ENCOUNTER — Inpatient Hospital Stay (HOSPITAL_COMMUNITY): Payer: BLUE CROSS/BLUE SHIELD

## 2016-12-04 DIAGNOSIS — Z01810 Encounter for preprocedural cardiovascular examination: Secondary | ICD-10-CM

## 2016-12-04 DIAGNOSIS — E1141 Type 2 diabetes mellitus with diabetic mononeuropathy: Secondary | ICD-10-CM

## 2016-12-04 DIAGNOSIS — I10 Essential (primary) hypertension: Secondary | ICD-10-CM

## 2016-12-04 DIAGNOSIS — E782 Mixed hyperlipidemia: Secondary | ICD-10-CM

## 2016-12-04 DIAGNOSIS — I214 Non-ST elevation (NSTEMI) myocardial infarction: Secondary | ICD-10-CM

## 2016-12-04 DIAGNOSIS — I2583 Coronary atherosclerosis due to lipid rich plaque: Secondary | ICD-10-CM

## 2016-12-04 LAB — CBC
HEMATOCRIT: 40.1 % (ref 39.0–52.0)
Hemoglobin: 13.8 g/dL (ref 13.0–17.0)
MCH: 29.1 pg (ref 26.0–34.0)
MCHC: 34.4 g/dL (ref 30.0–36.0)
MCV: 84.4 fL (ref 78.0–100.0)
Platelets: 269 10*3/uL (ref 150–400)
RBC: 4.75 MIL/uL (ref 4.22–5.81)
RDW: 13.5 % (ref 11.5–15.5)
WBC: 6.7 10*3/uL (ref 4.0–10.5)

## 2016-12-04 LAB — VAS US DOPPLER PRE CABG
LEFT ECA DIAS: 16 cm/s
LEFT VERTEBRAL DIAS: 15 cm/s
Left CCA dist dias: 21 cm/s
Left CCA dist sys: 81 cm/s
Left CCA prox dias: 18 cm/s
Left CCA prox sys: 109 cm/s
Left ICA dist dias: 37 cm/s
Left ICA dist sys: 118 cm/s
Left ICA prox dias: -24 cm/s
Left ICA prox sys: -92 cm/s
RIGHT ECA DIAS: 23 cm/s
RIGHT VERTEBRAL DIAS: -18 cm/s
Right CCA prox dias: -20 cm/s
Right CCA prox sys: -74 cm/s
Right cca dist sys: -69 cm/s

## 2016-12-04 LAB — BASIC METABOLIC PANEL
Anion gap: 9 (ref 5–15)
BUN: 13 mg/dL (ref 6–20)
CALCIUM: 9 mg/dL (ref 8.9–10.3)
CO2: 23 mmol/L (ref 22–32)
CREATININE: 0.76 mg/dL (ref 0.61–1.24)
Chloride: 104 mmol/L (ref 101–111)
GFR calc Af Amer: >60 mL/min (ref >60)
Glucose, Bld: 186 mg/dL — ABNORMAL HIGH (ref 65–99)
Potassium: 4.8 mmol/L (ref 3.5–5.1)
SODIUM: 136 mmol/L (ref 135–145)

## 2016-12-04 LAB — GLUCOSE, CAPILLARY
GLUCOSE-CAPILLARY: 188 mg/dL — AB (ref 65–99)
Glucose-Capillary: 241 mg/dL — ABNORMAL HIGH (ref 65–99)
Glucose-Capillary: 250 mg/dL — ABNORMAL HIGH (ref 65–99)
Glucose-Capillary: 303 mg/dL — ABNORMAL HIGH (ref 65–99)

## 2016-12-04 LAB — HEPARIN LEVEL (UNFRACTIONATED)
HEPARIN UNFRACTIONATED: 0.39 [IU]/mL (ref 0.30–0.70)
HEPARIN UNFRACTIONATED: 0.44 [IU]/mL (ref 0.30–0.70)

## 2016-12-04 LAB — PLATELET INHIBITION P2Y12: Platelet Function  P2Y12: 163 [PRU] — ABNORMAL LOW (ref 194–418)

## 2016-12-04 NOTE — Progress Notes (Signed)
Pt requesting out of work letter, MD Meda Coffee aware

## 2016-12-04 NOTE — Progress Notes (Addendum)
Progress Note  Patient Name: Grant Moran Date of Encounter: 12/04/2016  Primary Cardiologist: Dr Acie Fredrickson  Subjective   He feels better today. Denies CP or SOB.   Inpatient Medications    Scheduled Meds: . aspirin EC  81 mg Oral Daily  . atorvastatin  80 mg Oral Daily  . carvedilol  6.25 mg Oral BID WC  . fenofibrate  160 mg Oral Daily  . guaiFENesin  1,200 mg Oral BID  . insulin aspart  0-20 Units Subcutaneous TID WC  . insulin aspart  0-5 Units Subcutaneous QHS  . insulin glargine  20 Units Subcutaneous QHS  . insulin starter kit- pen needles  1 kit Other Once  . ipratropium-albuterol  3 mL Nebulization BID  . losartan  25 mg Oral Daily  . potassium chloride  20 mEq Oral BID  . sodium chloride flush  3 mL Intravenous Q12H  . sodium chloride flush  3 mL Intravenous Q12H   Continuous Infusions: . sodium chloride    . sodium chloride    . azithromycin Stopped (12/03/16 1405)  . cefTRIAXone (ROCEPHIN)  IV Stopped (12/03/16 1205)  . heparin 1,450 Units/hr (12/03/16 1839)   PRN Meds: sodium chloride, sodium chloride, acetaminophen, calcium carbonate, ipratropium-albuterol, nitroGLYCERIN, ondansetron (ZOFRAN) IV, sodium chloride flush, sodium chloride flush   Vital Signs    Vitals:   12/03/16 1758 12/03/16 1943 12/03/16 2003 12/04/16 0459  BP: 135/77 (!) 105/44  117/71  Pulse: 89 80  75  Resp:  18  18  Temp:  97.9 F (36.6 C)  (!) 97.5 F (36.4 C)  TempSrc:  Oral  Oral  SpO2:  92% 94% 98%  Weight:    158 lb 12.8 oz (72 kg)  Height:        Intake/Output Summary (Last 24 hours) at 12/04/16 0813 Last data filed at 12/04/16 0600  Gross per 24 hour  Intake          1873.59 ml  Output              700 ml  Net          1173.59 ml   Filed Weights   12/02/16 0500 12/03/16 0606 12/04/16 0459  Weight: 165 lb 3.2 oz (74.9 kg) 157 lb 6.4 oz (71.4 kg) 158 lb 12.8 oz (72 kg)    Telemetry    SR - Personally Reviewed  ECG    SR, negative T waves in the  anterolateral and inferior walls - Personally Reviewed  Physical Exam   GEN: No acute distress.   Neck: No JVD Cardiac: RRR, no murmurs, rubs, or gallops.  Respiratory: Clear to auscultation bilaterally. GI: Soft, nontender, non-distended  MS: No edema; No deformity. Neuro:  Nonfocal  Psych: Normal affect   Labs    Chemistry Recent Labs Lab 11/29/16 0936  12/02/16 0645 12/03/16 0332 12/04/16 0434  NA 138  < > 133* 133* 136  K 3.4*  < > 4.0 4.1 4.8  CL 103  < > 99* 97* 104  CO2 23  < > _0 GLUCOSE 232*  < > 266* 269* 186*  BUN 12  < > _1 CREATININE 0.73  < > 0.82 0.97 0.76  CALCIUM 9.1  < > 8.9 9.2 9.0  PROT 6.8  --   --   --   --   ALBUMIN 3.7  --   --   --   --   AST 21  --   --   --   --  ALT 16*  --   --   --   --   ALKPHOS 70  --   --   --   --   BILITOT 0.9  --   --   --   --   GFRNONAA >60  < > >60 >60 >60  GFRAA >60  < > >60 >60 >60  ANIONGAP 12  < > _0 < > = values in this interval not displayed.   Hematology Recent Labs Lab 12/02/16 0645 12/03/16 0332 12/04/16 0434  WBC 7.3 7.5 6.7  RBC 4.70 4.93 4.75  HGB 13.5 14.5 13.8  HCT 40.2 41.3 40.1  MCV 85.5 83.8 84.4  MCH 28.7 29.4 29.1  MCHC 33.6 35.1 34.4  RDW 13.4 13.5 13.5  PLT 266 271 269    Cardiac Enzymes Recent Labs Lab 11/29/16 0936 11/29/16 1337 11/29/16 1940 11/30/16 0024  TROPONINI 0.49* 0.55* 0.53* 0.50*   No results for input(s): TROPIPOC in the last 168 hours.   BNP Recent Labs Lab 11/29/16 0936 11/30/16 0503  BNP 1,413.0* 389.0*     DDimer  Recent Labs Lab 11/29/16 0936  DDIMER 0.28     Radiology    No results found.  Cardiac Studies   Cardiac cath: 12/02/16   Mid RCA lesion, 30 %stenosed.  Dist RCA lesion, 0 %stenosed.  Ost RCA lesion, 95 %stenosed.  Prox RCA lesion, 90 %stenosed.  There is severe left ventricular systolic dysfunction.  LV end diastolic pressure is normal.  The left ventricular ejection fraction is 25-35% by  visual estimate.  There is trivial (1+) mitral regurgitation.  Ost LAD to Prox LAD lesion, 80 %stenosed.  Mid LAD to Dist LAD lesion, 70 %stenosed.  Prox Cx to Mid Cx lesion, 100 %stenosed.  Ost Cx lesion, 50 %stenosed.  LV end diastolic pressure is normal.   1. Severe 3 vessel obstructive CAD 2. Severe LV dysfunction. WF estimated at 25-30%. 3. Low LV and right heart filling pressures and systemic hypotension c/w volume depletion.  4. Normal right heart pressures   Plan: Recommend CABG. Patient has 3 vessel disease and severe LV dysfunction in a diabetic. Will hold Plavix. Resume Heparin after sheath pull. Hold diuretics. Will switch Entresto to low dose losartan and reduce Coreg due to hypotension. IV hydration post cath.  Patient Profile     61 y.o. male   Assessment & Plan    1. Acute on chronic systolic CHF:  Overall improvement in symptoms , Crea normal.  On losartan, carvedilol. Dry on RHC yesterday, diuretics were held, he appears euvolemic and is asymptomatic.  2. CADwith NSTEMI:  LHC yesterday showed 1. Severe 3 vessel obstructive CAD, 2. Severe LV dysfunction. WF estimated at 25-30%. 3. Low LV and right heart filling pressures and systemic hypotension c/w volume depletion.  Seen by Dr Servando Snare, plan for CABG on Tuesday 8/13 once Plavix is washed out. P2Y12 163, we will repeat.  On ASA, atorvastatin, losartan, carvedilol.   3. Diabetes: Continue care per PCP team.   Signed, Ena Dawley, MD  12/04/2016, 8:13 AM

## 2016-12-04 NOTE — Progress Notes (Signed)
ANTICOAGULATION CONSULT NOTE - Follow Up Consult  Pharmacy Consult:  Heparin Indication: ACS, awaiting CABG  No Known Allergies  Patient Measurements: Height: 5\' 3"  (160 cm) Weight: 158 lb 12.8 oz (72 kg) (scale a) IBW/kg (Calculated) : 56.9 Heparin Dosing Weight: 73 kg  Vital Signs: Temp: 97.5 F (36.4 C) (08/11 0459) Temp Source: Oral (08/11 0459) BP: 117/71 (08/11 0459) Pulse Rate: 80 (08/11 0839)  Labs:  Recent Labs  12/02/16 0645  12/03/16 0332 12/04/16 0001 12/04/16 0434  HGB 13.5  --  14.5  --  13.8  HCT 40.2  --  41.3  --  40.1  PLT 266  --  271  --  269  HEPARINUNFRC  --   < > 0.51 0.39 0.44  CREATININE 0.82  --  0.97  --  0.76  < > = values in this interval not displayed.  Estimated Creatinine Clearance: 86.3 mL/min (by C-G formula based on SCr of 0.76 mg/dL).    Assessment: 55 YOF s/p cath to remain on IV heparin until CABG next week.  Heparin level is therapeutic; no bleeding reported.   Goal of Therapy:  Heparin level 0.3-0.7 units/ml Monitor platelets by anticoagulation protocol: Yes    Plan:  Continue heparin gtt at 1450 units/hr Daily heparin level and CBC Consider holding/reducing KCL supplementation while not on diuretic   Shantil Vallejo D. Mina Marble, PharmD, BCPS Pager:  [phone] 12/04/2016, 11:17 AM

## 2016-12-04 NOTE — Progress Notes (Signed)
CARDIAC REHAB PHASE I   PRE:  Rate/Rhythm: SR    BP:  Supine:  Sitting: 116/71  Standing:    SaO2: 96  MODE:  Ambulation: 470 ft   POST:  Rate/Rhythm: 106  BP:  Supine:   Sitting: 125/82  Standing:    SaO2: 97  Returned to pt room, previous attempt to see pt - he was leaving for vascular testing. Pt ambulated in hallway x 1 minimal assist.  Pt tolerated well. Pt will have Heart surgery on Tuesday.  Pt given educational material.  Attempted to go over booklet with pt and family.  Pt not interested and stated " I will find out when it happens".  Left material for pt and family to review at a later time.  Pt given  Timeline of what to expect.  Pt placed this in the booklet.  Offered to play video for preparing for heart surgery.  Pt declined.  Pt practiced getting up and down off the side of the bed without using arms.  Pt instructed on purse lip breathing techniques.  Pt demonstrated appropriately.  Pt to side of bed for lunch. Family at the bedside. Maurice Small RN, BSN Cardiac and Pulmonary Rehab Nurse Navigator [phone]

## 2016-12-04 NOTE — Progress Notes (Signed)
Refused bed alarm. Will continue to monitor patient. 

## 2016-12-04 NOTE — Progress Notes (Signed)
Pre-op Cardiac Surgery  Carotid Findings:   Findings are consistent with a 1-39 percent stenosis involving the right internal carotid artery and the left internal carotid artery. The vertebral arteries demonstrate antegrade flow.  Upper Extremity Right Left  Brachial Pressures 136  Triphasic 172  Triphasic  Radial Waveforms Triphasic Triphasic  Ulnar Waveforms Triphasic Triphasic  Palmar Arch (Allen's Test) Palmar waveforms are obliterated with radial compression and remain within normal limits with ulnar compression. Palmar waveforms are obliterated with radial and ulnar compression.    Lower  Extremity Right Left  Dorsalis Pedis 69  Monophasic >254  Monophasic  Posterior Tibial 160  Monophasic 39  Monophasic  Ankle/Brachial Indices 0.93    Findings:   Right ABI of 0.93 is suggestive of mild arterial occlusive disease at rest. Left ABI could not be accurately obtained due to non-compressible arteries likely due to medial calcification.

## 2016-12-04 NOTE — Progress Notes (Signed)
ANTICOAGULATION CONSULT NOTE - Follow Up Consult  Pharmacy Consult for heparin Indication: CAD awaiting CABG  Labs:  Recent Labs  12/01/16 0757  12/02/16 0645  12/02/16 2149 12/03/16 0332 12/04/16 0001  HGB 12.9*  --  13.5  --   --  14.5  --   HCT 37.5*  --  40.2  --   --  41.3  --   PLT 232  --  266  --   --  271  --   HEPARINUNFRC  --   < >  --   < > 0.32 0.51 0.39  CREATININE 0.63  --  0.82  --   --  0.97  --   < > = values in this interval not displayed.   Assessment/Plan:  61yo male therapeutic on heparin after resumed post-cath. Will continue gtt at current rate and confirm stable with am labs.   Wynona Neat, PharmD, BCPS  12/04/2016,1:28 AM

## 2016-12-05 DIAGNOSIS — J189 Pneumonia, unspecified organism: Secondary | ICD-10-CM

## 2016-12-05 LAB — HEPARIN LEVEL (UNFRACTIONATED): Heparin Unfractionated: 0.61 IU/mL (ref 0.30–0.70)

## 2016-12-05 LAB — CBC
HEMATOCRIT: 38.5 % — AB (ref 39.0–52.0)
HEMOGLOBIN: 13.1 g/dL (ref 13.0–17.0)
MCH: 28.7 pg (ref 26.0–34.0)
MCHC: 34 g/dL (ref 30.0–36.0)
MCV: 84.2 fL (ref 78.0–100.0)
Platelets: 271 10*3/uL (ref 150–400)
RBC: 4.57 MIL/uL (ref 4.22–5.81)
RDW: 13.4 % (ref 11.5–15.5)
WBC: 7.2 10*3/uL (ref 4.0–10.5)

## 2016-12-05 LAB — PLATELET INHIBITION P2Y12: Platelet Function  P2Y12: 185 [PRU] — ABNORMAL LOW (ref 194–418)

## 2016-12-05 LAB — GLUCOSE, CAPILLARY
GLUCOSE-CAPILLARY: 303 mg/dL — AB (ref 65–99)
GLUCOSE-CAPILLARY: 265 mg/dL — AB (ref 65–99)
GLUCOSE-CAPILLARY: 251 mg/dL — AB (ref 65–99)

## 2016-12-05 NOTE — Progress Notes (Signed)
Progress Note  Patient Name: Grant Moran Date of Encounter: 12/05/2016  Primary Cardiologist: Dr Acie Fredrickson  Subjective   He feels good today. Denies any more CP or SOB.   Inpatient Medications    Scheduled Meds: . aspirin EC  81 mg Oral Daily  . atorvastatin  80 mg Oral Daily  . carvedilol  6.25 mg Oral BID WC  . fenofibrate  160 mg Oral Daily  . guaiFENesin  1,200 mg Oral BID  . insulin aspart  0-20 Units Subcutaneous TID WC  . insulin aspart  0-5 Units Subcutaneous QHS  . insulin glargine  20 Units Subcutaneous QHS  . insulin starter kit- pen needles  1 kit Other Once  . ipratropium-albuterol  3 mL Nebulization BID  . losartan  25 mg Oral Daily  . potassium chloride  20 mEq Oral BID  . sodium chloride flush  3 mL Intravenous Q12H  . sodium chloride flush  3 mL Intravenous Q12H   Continuous Infusions: . sodium chloride    . sodium chloride    . azithromycin Stopped (12/04/16 1049)  . cefTRIAXone (ROCEPHIN)  IV 1 g (12/05/16 0902)  . heparin 1,450 Units/hr (12/05/16 0703)   PRN Meds: sodium chloride, sodium chloride, acetaminophen, calcium carbonate, ipratropium-albuterol, nitroGLYCERIN, ondansetron (ZOFRAN) IV, sodium chloride flush, sodium chloride flush   Vital Signs    Vitals:   12/04/16 1919 12/04/16 2004 12/05/16 0438 12/05/16 0905  BP: 116/66  95/76 137/77  Pulse: 89  89 (!) 109  Resp: 20  18   Temp: 98 F (36.7 C)  97.9 F (36.6 C)   TempSrc: Oral  Oral   SpO2: 96% 92% 95%   Weight:   159 lb 4.8 oz (72.3 kg)   Height:        Intake/Output Summary (Last 24 hours) at 12/05/16 0941 Last data filed at 12/05/16 0300  Gross per 24 hour  Intake          1435.93 ml  Output             2800 ml  Net         -1364.07 ml   Filed Weights   12/03/16 0606 12/04/16 0459 12/05/16 0438  Weight: 157 lb 6.4 oz (71.4 kg) 158 lb 12.8 oz (72 kg) 159 lb 4.8 oz (72.3 kg)   Telemetry    SR - Personally Reviewed  ECG    SR, negative T waves in the anterolateral  and inferior walls - Personally Reviewed  Physical Exam   GEN: No acute distress.   Neck: No JVD Cardiac: RRR, no murmurs, rubs, or gallops.  Respiratory: Clear to auscultation bilaterally. GI: Soft, nontender, non-distended  MS: No edema; No deformity. Neuro:  Nonfocal  Psych: Normal affect   Labs    Chemistry Recent Labs Lab 11/29/16 0936  12/02/16 0645 12/03/16 0332 12/04/16 0434  NA 138  < > 133* 133* 136  K 3.4*  < > 4.0 4.1 4.8  CL 103  < > 99* 97* 104  CO2 23  < > _0 GLUCOSE 232*  < > 266* 269* 186*  BUN 12  < > _1 CREATININE 0.73  < > 0.82 0.97 0.76  CALCIUM 9.1  < > 8.9 9.2 9.0  PROT 6.8  --   --   --   --   ALBUMIN 3.7  --   --   --   --   AST 21  --   --   --   --  ALT 16*  --   --   --   --   ALKPHOS 70  --   --   --   --   BILITOT 0.9  --   --   --   --   GFRNONAA >60  < > >60 >60 >60  GFRAA >60  < > >60 >60 >60  ANIONGAP 12  < > _0 < > = values in this interval not displayed.   Hematology  Recent Labs Lab 12/03/16 0332 12/04/16 0434 12/05/16 0254  WBC 7.5 6.7 7.2  RBC 4.93 4.75 4.57  HGB 14.5 13.8 13.1  HCT 41.3 40.1 38.5*  MCV 83.8 84.4 84.2  MCH 29.4 29.1 28.7  MCHC 35.1 34.4 34.0  RDW 13.5 13.5 13.4  PLT 271 269 271    Cardiac Enzymes  Recent Labs Lab 11/29/16 0936 11/29/16 1337 11/29/16 1940 11/30/16 0024  TROPONINI 0.49* 0.55* 0.53* 0.50*   No results for input(s): TROPIPOC in the last 168 hours.   BNP  Recent Labs Lab 11/29/16 0936 11/30/16 0503  BNP 1,413.0* 389.0*     DDimer   Recent Labs Lab 11/29/16 0936  DDIMER 0.28     Radiology    No results found.  Cardiac Studies   Cardiac cath: 12/02/16   Mid RCA lesion, 30 %stenosed.  Dist RCA lesion, 0 %stenosed.  Ost RCA lesion, 95 %stenosed.  Prox RCA lesion, 90 %stenosed.  There is severe left ventricular systolic dysfunction.  LV end diastolic pressure is normal.  The left ventricular ejection fraction is 25-35% by visual  estimate.  There is trivial (1+) mitral regurgitation.  Ost LAD to Prox LAD lesion, 80 %stenosed.  Mid LAD to Dist LAD lesion, 70 %stenosed.  Prox Cx to Mid Cx lesion, 100 %stenosed.  Ost Cx lesion, 50 %stenosed.  LV end diastolic pressure is normal.   1. Severe 3 vessel obstructive CAD 2. Severe LV dysfunction. WF estimated at 25-30%. 3. Low LV and right heart filling pressures and systemic hypotension c/w volume depletion.  4. Normal right heart pressures  Plan: Recommend CABG. Patient has 3 vessel disease and severe LV dysfunction in a diabetic. Will hold Plavix. Resume Heparin after sheath pull. Hold diuretics. Will switch Entresto to low dose losartan and reduce Coreg due to hypotension. IV hydration post cath.    Patient Profile     61 y.o. male   Assessment & Plan    1. Acute on chronic systolic CHF:  Overall improvement in symptoms , Crea normal.  On losartan, carvedilol. Dry on RHC yesterday, diuretics were held, he appears euvolemic and is asymptomatic.  2. CADwith NSTEMI:  LHC yesterday showed 1. Severe 3 vessel obstructive CAD, 2. Severe LV dysfunction. WF estimated at 25-30%. 3. Low LV and right heart filling pressures and systemic hypotension c/w volume depletion.  Seen by Dr Servando Snare, plan for CABG on Tuesday 8/13 once Plavix is washed out. P2Y12 163-> today 185, we will repeat tomorrow.  On ASA, atorvastatin, losartan, carvedilol. BP controlled, tachycardic, I  Will encourage fluid intake.  3. Diabetes: Continue care per PCP team.   Signed, Ena Dawley, MD  12/05/2016, 9:41 AM

## 2016-12-05 NOTE — Progress Notes (Signed)
ANTICOAGULATION CONSULT NOTE - Follow Up Consult  Pharmacy Consult:  Heparin Indication: ACS, awaiting CABG  No Known Allergies  Patient Measurements: Height: 5\' 3"  (160 cm) Weight: 159 lb 4.8 oz (72.3 kg) (scale a) IBW/kg (Calculated) : 56.9 Heparin Dosing Weight: 73 kg  Vital Signs: Temp: 97.9 F (36.6 C) (08/12 0438) Temp Source: Oral (08/12 0438) BP: 137/77 (08/12 0905) Pulse Rate: 109 (08/12 0905)  Labs:  Recent Labs  12/03/16 0332 12/04/16 0001 12/04/16 0434 12/05/16 0254  HGB 14.5  --  13.8 13.1  HCT 41.3  --  40.1 38.5*  PLT 271  --  269 271  HEPARINUNFRC 0.51 0.39 0.44 0.61  CREATININE 0.97  --  0.76  --     Estimated Creatinine Clearance: 86.5 mL/min (by C-G formula based on SCr of 0.76 mg/dL).  Assessment: 89 YOF s/p cath to remain on IV heparin until CABG on Tuesday.  Heparin level is therapeutic this morning at 0.61. No bleeding or other issues documented with heparin drip overnight. CBC is within normal limits, hemoglobin and platelets are stable.    Goal of Therapy:  Heparin level 0.3-0.7 units/ml Monitor platelets by anticoagulation protocol: Yes  Plan:  Continue heparin infusion at 1450 units/hr Daily heparin level and CBC  Jalene Mullet, Pharm.D. PGY1 Pharmacy Resident 12/05/2016 10:35 AM Main Pharmacy: [phone]

## 2016-12-05 NOTE — Plan of Care (Signed)
Problem: Physical Regulation: Goal: Ability to maintain clinical measurements within normal limits will improve Outcome: Completed/Met Date Met: 12/05/16 Pt. Stated he's able to get around with no issues. Same as before being admitted to hospital.

## 2016-12-06 ENCOUNTER — Inpatient Hospital Stay (HOSPITAL_COMMUNITY): Payer: BLUE CROSS/BLUE SHIELD

## 2016-12-06 DIAGNOSIS — E7800 Pure hypercholesterolemia, unspecified: Secondary | ICD-10-CM

## 2016-12-06 LAB — URINALYSIS, ROUTINE W REFLEX MICROSCOPIC
BILIRUBIN URINE: NEGATIVE
HGB URINE DIPSTICK: NEGATIVE
KETONES UR: NEGATIVE mg/dL
LEUKOCYTES UA: NEGATIVE
NITRITE: NEGATIVE
PH: 5 (ref 5.0–8.0)
PROTEIN: NEGATIVE mg/dL
RBC / HPF: NONE SEEN RBC/hpf (ref 0–5)
SPECIFIC GRAVITY, URINE: 1.009 (ref 1.005–1.030)
SQUAMOUS EPITHELIAL / LPF: NONE SEEN
WBC, UA: NONE SEEN WBC/hpf (ref 0–5)

## 2016-12-06 LAB — GLUCOSE, CAPILLARY
GLUCOSE-CAPILLARY: 228 mg/dL — AB (ref 65–99)
GLUCOSE-CAPILLARY: 212 mg/dL — AB (ref 65–99)
GLUCOSE-CAPILLARY: 312 mg/dL — AB (ref 65–99)
Glucose-Capillary: 398 mg/dL — ABNORMAL HIGH (ref 65–99)

## 2016-12-06 LAB — PULMONARY FUNCTION TEST
DL/VA % pred: 115 %
DL/VA: 4.75 ml/min/mmHg/L
DLCO cor % pred: 88 %
DLCO cor: 20.22 ml/min/mmHg
DLCO unc % pred: 84 %
DLCO unc: 19.44 ml/min/mmHg
FEF 25-75 Post: 1.67 L/sec
FEF 25-75 Pre: 1.57 L/sec
FEF2575-%Change-Post: 6 %
FEF2575-%Pred-Post: 73 %
FEF2575-%Pred-Pre: 69 %
FEV1-%Change-Post: -3 %
FEV1-%Pred-Post: 77 %
FEV1-%Pred-Pre: 80 %
FEV1-Post: 2.1 L
FEV1-Pre: 2.17 L
FEV1FVC-%Change-Post: -10 %
FEV1FVC-%Pred-Pre: 89 %
FEV6-%Change-Post: 12 %
FEV6-%Pred-Post: 100 %
FEV6-%Pred-Pre: 89 %
FEV6-Post: 3.43 L
FEV6-Pre: 3.05 L
FEV6FVC-%Change-Post: -1 %
FEV6FVC-%Pred-Post: 103 %
FEV6FVC-%Pred-Pre: 105 %
FVC-%Change-Post: 7 %
FVC-%Pred-Post: 97 %
FVC-%Pred-Pre: 90 %
FVC-Post: 3.49 L
FVC-Pre: 3.24 L
Post FEV1/FVC ratio: 60 %
Post FEV6/FVC ratio: 98 %
Pre FEV1/FVC ratio: 67 %
Pre FEV6/FVC Ratio: 100 %
RV % pred: 90 %
RV: 1.71 L
TLC % pred: 90 %
TLC: 5.06 L

## 2016-12-06 LAB — TYPE AND SCREEN
ABO/RH(D): O POS
Antibody Screen: NEGATIVE

## 2016-12-06 LAB — PROTIME-INR
INR: 1.01
Prothrombin Time: 13.3 seconds (ref 11.4–15.2)

## 2016-12-06 LAB — SURGICAL PCR SCREEN
MRSA, PCR: NEGATIVE
STAPHYLOCOCCUS AUREUS: NEGATIVE

## 2016-12-06 LAB — HEPARIN LEVEL (UNFRACTIONATED): HEPARIN UNFRACTIONATED: 0.64 [IU]/mL (ref 0.30–0.70)

## 2016-12-06 LAB — CBC
HCT: 38.9 % — ABNORMAL LOW (ref 39.0–52.0)
Hemoglobin: 13.3 g/dL (ref 13.0–17.0)
MCH: 28.8 pg (ref 26.0–34.0)
MCHC: 34.2 g/dL (ref 30.0–36.0)
MCV: 84.2 fL (ref 78.0–100.0)
PLATELETS: 255 10*3/uL (ref 150–400)
RBC: 4.62 MIL/uL (ref 4.22–5.81)
RDW: 13.4 % (ref 11.5–15.5)
WBC: 6.4 10*3/uL (ref 4.0–10.5)

## 2016-12-06 LAB — PLATELET INHIBITION P2Y12: Platelet Function  P2Y12: 208 [PRU] (ref 194–418)

## 2016-12-06 LAB — ABO/RH: ABO/RH(D): O POS

## 2016-12-06 MED ORDER — DEXTROSE 5 % IV SOLN
750.0000 mg | INTRAVENOUS | Status: DC
  Filled 2016-12-06: qty 750

## 2016-12-06 MED ORDER — TRANEXAMIC ACID (OHS) BOLUS VIA INFUSION
15.0000 mg/kg | INTRAVENOUS | Status: AC
  Administered 2016-12-07: 1084.5 mg via INTRAVENOUS
  Filled 2016-12-06: qty 1085

## 2016-12-06 MED ORDER — POTASSIUM CHLORIDE 2 MEQ/ML IV SOLN
80.0000 meq | INTRAVENOUS | Status: DC
  Filled 2016-12-06: qty 40

## 2016-12-06 MED ORDER — METOPROLOL TARTRATE 12.5 MG HALF TABLET
12.5000 mg | ORAL_TABLET | Freq: Once | ORAL | Status: AC
  Administered 2016-12-07: 12.5 mg via ORAL
  Filled 2016-12-06: qty 1

## 2016-12-06 MED ORDER — PAPAVERINE HCL 30 MG/ML IJ SOLN
INTRAMUSCULAR | Status: AC
  Administered 2016-12-07: 500 mL
  Filled 2016-12-06: qty 2.5

## 2016-12-06 MED ORDER — DEXTROSE 5 % IV SOLN
1.5000 g | INTRAVENOUS | Status: AC
  Administered 2016-12-07: .75 g via INTRAVENOUS
  Administered 2016-12-07: 1.5 g via INTRAVENOUS
  Filled 2016-12-06: qty 1.5

## 2016-12-06 MED ORDER — TEMAZEPAM 15 MG PO CAPS: 15.0000 mg | ORAL_CAPSULE | Freq: Once | ORAL | Status: DC | PRN

## 2016-12-06 MED ORDER — SODIUM CHLORIDE 0.9 % IV SOLN
INTRAVENOUS | Status: AC
  Administered 2016-12-07: 3.3 [IU]/h via INTRAVENOUS
  Filled 2016-12-06: qty 1

## 2016-12-06 MED ORDER — DEXMEDETOMIDINE HCL IN NACL 400 MCG/100ML IV SOLN
0.1000 ug/kg/h | INTRAVENOUS | Status: AC
  Administered 2016-12-07: .2 ug/kg/h via INTRAVENOUS
  Filled 2016-12-06: qty 100

## 2016-12-06 MED ORDER — SODIUM CHLORIDE 0.9 % IV SOLN
30.0000 ug/min | INTRAVENOUS | Status: DC
  Filled 2016-12-06: qty 2

## 2016-12-06 MED ORDER — SODIUM CHLORIDE 0.9 % IV SOLN
INTRAVENOUS | Status: DC
  Filled 2016-12-06: qty 30

## 2016-12-06 MED ORDER — TRANEXAMIC ACID (OHS) PUMP PRIME SOLUTION
2.0000 mg/kg | INTRAVENOUS | Status: DC
  Filled 2016-12-06: qty 1.45

## 2016-12-06 MED ORDER — MAGNESIUM SULFATE 50 % IJ SOLN
40.0000 meq | INTRAMUSCULAR | Status: DC
  Filled 2016-12-06: qty 10

## 2016-12-06 MED ORDER — CHLORHEXIDINE GLUCONATE CLOTH 2 % EX PADS
6.0000 | MEDICATED_PAD | Freq: Once | CUTANEOUS | Status: AC
  Administered 2016-12-06: 6 via TOPICAL

## 2016-12-06 MED ORDER — DOPAMINE-DEXTROSE 3.2-5 MG/ML-% IV SOLN
0.0000 ug/kg/min | INTRAVENOUS | Status: AC
  Administered 2016-12-07: 3 ug/kg/min via INTRAVENOUS
  Filled 2016-12-06: qty 250

## 2016-12-06 MED ORDER — CHLORHEXIDINE GLUCONATE 0.12 % MT SOLN
15.0000 mL | Freq: Once | OROMUCOSAL | Status: AC
  Administered 2016-12-07: 15 mL via OROMUCOSAL
  Filled 2016-12-06: qty 15

## 2016-12-06 MED ORDER — VANCOMYCIN HCL 10 G IV SOLR
1250.0000 mg | INTRAVENOUS | Status: AC
  Administered 2016-12-07: 1250 mg via INTRAVENOUS
  Filled 2016-12-06: qty 1250

## 2016-12-06 MED ORDER — TRANEXAMIC ACID 1000 MG/10ML IV SOLN
1.5000 mg/kg/h | INTRAVENOUS | Status: AC
  Administered 2016-12-07: 1.5 mg/kg/h via INTRAVENOUS
  Filled 2016-12-06: qty 25

## 2016-12-06 MED ORDER — CHLORHEXIDINE GLUCONATE CLOTH 2 % EX PADS
6.0000 | MEDICATED_PAD | Freq: Once | CUTANEOUS | Status: AC
  Administered 2016-12-07: 6 via TOPICAL

## 2016-12-06 MED ORDER — NITROGLYCERIN IN D5W 200-5 MCG/ML-% IV SOLN
2.0000 ug/min | INTRAVENOUS | Status: AC
  Administered 2016-12-07: 10 ug/min via INTRAVENOUS
  Filled 2016-12-06: qty 250

## 2016-12-06 MED ORDER — EPINEPHRINE PF 1 MG/ML IJ SOLN
0.0000 ug/min | INTRAVENOUS | Status: DC
  Filled 2016-12-06: qty 4

## 2016-12-06 MED ORDER — BISACODYL 5 MG PO TBEC
5.0000 mg | DELAYED_RELEASE_TABLET | Freq: Once | ORAL | Status: AC
  Administered 2016-12-06: 5 mg via ORAL
  Filled 2016-12-06: qty 1

## 2016-12-06 NOTE — Progress Notes (Signed)
Progress Note  Patient Name: Grant Moran Date of Encounter: 12/06/2016  Primary Cardiologist: Nahser  Subjective   Patient admitted on 11/29/16 with chest pain and shortness of breath. He had a left heart cath performed by Dr. Martinique on 12/03/16 revealing three-vessel disease with moderate to severe LV dysfunction with recommendations of coronary artery bypass grafting. His right heart cath suggested that he was hypokalemic with low filling pressures. His Plavix was discontinued. His PRU was 185 yesterday. He is tentatively scheduled for coronary artery bypass grafting with Dr. Pia Mau tomorrow. He is on IV heparin and is pain-free.  Inpatient Medications    Scheduled Meds: . aspirin EC  81 mg Oral Daily  . atorvastatin  80 mg Oral Daily  . carvedilol  6.25 mg Oral BID WC  . fenofibrate  160 mg Oral Daily  . guaiFENesin  1,200 mg Oral BID  . insulin aspart  0-20 Units Subcutaneous TID WC  . insulin aspart  0-5 Units Subcutaneous QHS  . insulin glargine  20 Units Subcutaneous QHS  . insulin starter kit- pen needles  1 kit Other Once  . ipratropium-albuterol  3 mL Nebulization BID  . losartan  25 mg Oral Daily  . potassium chloride  20 mEq Oral BID  . sodium chloride flush  3 mL Intravenous Q12H  . sodium chloride flush  3 mL Intravenous Q12H   Continuous Infusions: . sodium chloride    . sodium chloride    . heparin 1,450 Units/hr (12/06/16 0151)   PRN Meds: sodium chloride, sodium chloride, acetaminophen, calcium carbonate, ipratropium-albuterol, nitroGLYCERIN, ondansetron (ZOFRAN) IV, sodium chloride flush, sodium chloride flush   Vital Signs    Vitals:   12/05/16 0949 12/05/16 1239 12/05/16 1936 12/06/16 0505  BP:  111/62 111/72 (!) 140/55  Pulse:  92 88 86  Resp:  20 20 20   Temp:  98.2 F (36.8 C) 97.6 F (36.4 C) 97.7 F (36.5 C)  TempSrc:  Oral Axillary Oral  SpO2: 93% 93% 97% 94%  Weight:    159 lb 6.4 oz (72.3 kg)  Height:        Intake/Output Summary  (Last 24 hours) at 12/06/16 1055 Last data filed at 12/06/16 0947  Gross per 24 hour  Intake          1671.57 ml  Output             2450 ml  Net          -778.43 ml   Filed Weights   12/04/16 0459 12/05/16 0438 12/06/16 0505  Weight: 158 lb 12.8 oz (72 kg) 159 lb 4.8 oz (72.3 kg) 159 lb 6.4 oz (72.3 kg)    Telemetry    Sinus rhythm/sinus tachycardia - Personally Reviewed  ECG    Not performed today - Personally Reviewed  Physical Exam   GEN: No acute distress.   Neck: No JVD Cardiac: RRR, no murmurs, rubs, or gallops.  Respiratory: Clear to auscultation bilaterally. GI: Soft, nontender, non-distended  MS: No edema; No deformity. Neuro:  Nonfocal  Psych: Normal affect   Labs    Chemistry Recent Labs Lab 12/02/16 0645 12/03/16 0332 12/04/16 0434  NA 133* 133* 136  K 4.0 4.1 4.8  CL 99* 97* 104  CO2 25 25 23   GLUCOSE 266* 269* 186*  BUN 16 19 13   CREATININE 0.82 0.97 0.76  CALCIUM 8.9 9.2 9.0  GFRNONAA >60 >60 >60  GFRAA >60 >60 >60  ANIONGAP 9 11 9  Hematology Recent Labs Lab 12/04/16 0434 12/05/16 0254 12/06/16 0340  WBC 6.7 7.2 6.4  RBC 4.75 4.57 4.62  HGB 13.8 13.1 13.3  HCT 40.1 38.5* 38.9*  MCV 84.4 84.2 84.2  MCH 29.1 28.7 28.8  MCHC 34.4 34.0 34.2  RDW 13.5 13.4 13.4  PLT 269 271 255    Cardiac Enzymes Recent Labs Lab 11/29/16 1337 11/29/16 1940 11/30/16 0024  TROPONINI 0.55* 0.53* 0.50*   No results for input(s): TROPIPOC in the last 168 hours.   BNP Recent Labs Lab 11/30/16 0503  BNP 389.0*     DDimer No results for input(s): DDIMER in the last 168 hours.   Radiology    No results found.  Cardiac Studies   2  D echo ( 11/30/16)  Study Conclusions  - Left ventricle: Diffuse hypokinesis worse in the inferior wall   and septum The cavity size was moderately dilated. Wall thickness   was increased in a pattern of moderate LVH. The estimated   ejection fraction was 30%. Left ventricular diastolic function    parameters were normal. - Mitral valve: There was mild regurgitation. - Left atrium: The atrium was moderately dilated. - Atrial septum: No defect or patent foramen ovale was identified. - Pulmonary arteries: PA peak pressure: 40 mm Hg (S).  Cardiac catheterization (12/03/16)  Conclusion     Mid RCA lesion, 30 %stenosed.  Dist RCA lesion, 0 %stenosed.  Ost RCA lesion, 95 %stenosed.  Prox RCA lesion, 90 %stenosed.  There is severe left ventricular systolic dysfunction.  LV end diastolic pressure is normal.  The left ventricular ejection fraction is 25-35% by visual estimate.  There is trivial (1+) mitral regurgitation.  Ost LAD to Prox LAD lesion, 80 %stenosed.  Mid LAD to Dist LAD lesion, 70 %stenosed.  Prox Cx to Mid Cx lesion, 100 %stenosed.  Ost Cx lesion, 50 %stenosed.  LV end diastolic pressure is normal.   1. Severe 3 vessel obstructive CAD 2. Severe LV dysfunction. WF estimated at 25-30%. 3. Low LV and right heart filling pressures and systemic hypotension c/w volume depletion.  4. Normal right heart pressures   Plan: Recommend CABG. Patient has 3 vessel disease and severe LV dysfunction in a diabetic. Will hold Plavix. Resume Heparin after sheath pull. Hold diuretics. Will switch Entresto to low dose losartan and reduce Coreg due to hypotension. IV hydration post cath.     Patient Profile     61 y.o. male with remote history of RCA intervention in 2001 with a bare metal stent. He does have poorly controlled diabetes as well. He was admitted with chest pain and shortness of breath and underwent right left heart cath by Dr. Martinique revealing three-vessel disease, moderately severe LV dysfunction and low filling pressures. He was seen in consultation by Dr. Pia Mau who agreed with Dr. Martinique regarding the need for bypass surgery which is tentatively scheduled for tomorrow after Plavix washout.  Assessment & Plan    1: Coronary artery disease-recent cath  showing three-vessel disease. Plavix was discontinued. He remains on IV heparin with plans for coronary artery bypass grafting tomorrow. Await results of platelet reactivity test.  2: Ischemic cardiomyopathy-currently not in heart failure. He is on losartan and carvedilol.   Angelina Sheriff, MD  12/06/2016, 10:55 AM

## 2016-12-06 NOTE — Progress Notes (Signed)
CARDIAC REHAB PHASE I   PRE:  Rate/Rhythm: 94 SR  BP:  Sitting: 110/73        SaO2: 95 RA  MODE:  Ambulation: 850 ft   POST:  Rate/Rhythm: 110 ST  BP:  Sitting: 135/86         SaO2: 98 RA  Pt ambulated 850 ft on RA, IV, assist x1 (to push IV pole), steady gait, tolerated well with no complaints. Encouraged additional ambulation as tolerated. Pt to bed per pt request after walk, call bell within reach. Will follow.   [place], RN, BSN 12/06/2016 9:13 AM

## 2016-12-06 NOTE — Anesthesia Preprocedure Evaluation (Addendum)
Anesthesia Evaluation  Patient identified by MRN, date of birth, ID band Patient awake    Reviewed: Allergy & Precautions, H&P , NPO status , Patient's Chart, lab work & pertinent test results  Airway Mallampati: II  TM Distance: >3 FB Neck ROM: Full    Dental no notable dental hx. (+) Upper Dentures, Lower Dentures, Dental Advisory Given   Pulmonary neg pulmonary ROS, former smoker,    Pulmonary exam normal breath sounds clear to auscultation       Cardiovascular Exercise Tolerance: Good hypertension, Pt. on medications and Pt. on home beta blockers + CAD, + Past MI, + Cardiac Stents, + Peripheral Vascular Disease and +CHF   Rhythm:Regular Rate:Normal     Neuro/Psych negative neurological ROS  negative psych ROS   GI/Hepatic negative GI ROS, Neg liver ROS,   Endo/Other  diabetes, Type 2, Oral Hypoglycemic Agents  Renal/GU negative Renal ROS  negative genitourinary   Musculoskeletal   Abdominal   Peds  Hematology negative hematology ROS (+)   Anesthesia Other Findings   Reproductive/Obstetrics negative OB ROS                            Anesthesia Physical Anesthesia Plan  ASA: IV  Anesthesia Plan: General   Post-op Pain Management:    Induction: Intravenous  PONV Risk Score and Plan: Treatment may vary due to age or medical condition  Airway Management Planned: Oral ETT  Additional Equipment: Arterial line, CVP, PA Cath, TEE and Ultrasound Guidance Line Placement  Intra-op Plan:   Post-operative Plan: Post-operative intubation/ventilation  Informed Consent: I have reviewed the patients History and Physical, chart, labs and discussed the procedure including the risks, benefits and alternatives for the proposed anesthesia with the patient or authorized representative who has indicated his/her understanding and acceptance.   Dental advisory given  Plan Discussed with:  CRNA  Anesthesia Plan Comments:         Anesthesia Quick Evaluation

## 2016-12-06 NOTE — Progress Notes (Signed)
ANTICOAGULATION CONSULT NOTE - Follow Up Consult  Pharmacy Consult:  Heparin Indication: ACS, awaiting CABG  No Known Allergies  Patient Measurements: Height: 5\' 3"  (160 cm) Weight: 159 lb 6.4 oz (72.3 kg) IBW/kg (Calculated) : 56.9 Heparin Dosing Weight: 73 kg  Vital Signs: Temp: 97.7 F (36.5 C) (08/13 0505) Temp Source: Oral (08/13 0505) BP: 140/55 (08/13 0505) Pulse Rate: 86 (08/13 0505)  Labs:  Recent Labs  12/04/16 0434 12/05/16 0254 12/06/16 0340  HGB 13.8 13.1 13.3  HCT 40.1 38.5* 38.9*  PLT 269 271 255  HEPARINUNFRC 0.44 0.61 0.64  CREATININE 0.76  --   --     Estimated Creatinine Clearance: 86.5 mL/min (by C-G formula based on SCr of 0.76 mg/dL).  Assessment: 79 YOF s/p cath to remain on IV heparin until CABG on Tuesday 8/14.  Heparin level remains in therapeutic range this morning at 0.64 on rate of 1450 units/hr heparin drip. No bleeding noted. CBC is within normal limits, hemoglobin and platelets are stable. Patient currently noted to be pain free.    Goal of Therapy:  Heparin level 0.3-0.7 units/ml Monitor platelets by anticoagulation protocol: Yes  Plan:  Continue heparin infusion at 1450 units/hr Daily heparin level and CBC   Thank you for allowing pharmacy to be part of this patients care team. Nicole Cella, RPh Clinical Pharmacist [phone]-4P [phone]-[phone] [phone] [place] [phone] Pager: [phone] 12/06/2016 12:36 PM

## 2016-12-06 NOTE — Progress Notes (Signed)
Inpatient Diabetes Program Recommendations  AACE/ADA: New Consensus Statement on Inpatient Glycemic Control (2015)  Target Ranges:  Prepandial:   less than 140 mg/dL      Peak postprandial:   less than 180 mg/dL (1-2 hours)      Critically ill patients:  140 - 180 mg/dL   Results for Grant Moran, Grant Moran (MRN [id number]) as of 12/06/2016 10:01  Ref. Range 12/05/2016 11:18 12/05/2016 16:25 12/05/2016 21:03 12/06/2016 07:55  Glucose-Capillary Latest Ref Range: 65 - 99 mg/dL 303 (H)  Novolog 15 units 265 (H)  Novolog 11 units 251 (H)  Novolog 3 units  Lantus 20 units 228 (H)  Novolog 7 units   Review of Glycemic Control  Diabetes history: DM2 Outpatient Diabetes medications: Glucovance 5-500 mg BID, Invokana 300 mg daily, Januvia 100 mg daily Current orders for Inpatient glycemic control: Lantus 20units QHS, Novolog 0-20units TID with meals, Novolog 0-5 units QHS  Inpatient Diabetes Program Recommendations:  Insulin - Basal: Please consider increasing Lantus to 25 units QHS. Insulin - Meal Coverage: Post prandial glucose is consistently elevated. Please consider ordering Novolog 6 units TID with meals for meal coverage.  HgbA1C: A1C 12.2% on 11/29/16 indicating an average glucose of 303 mg/dl over the past 2-3 months. Patient needs to follow up with PCP or Endocrinologist regarding improving DM control. Patient has been educated on insulin (patient prefers insulin pens) as he will likely be discharged new to insulin.  Thanks, Barnie Alderman, RN, MSN, CDE Diabetes Coordinator Inpatient Diabetes Program [phone] (Team Pager from 8am to 5pm)

## 2016-12-06 NOTE — Progress Notes (Signed)
[address]       [place] [address]             [phone]                 3 Days Post-Op Procedure(s) (LRB): RIGHT/LEFT HEART CATH AND CORONARY ANGIOGRAPHY (N/A)  LOS: 7 days   Subjective: stable day, no chest pain   Objective: Vital signs in last 24 hours: Patient Vitals for the past 24 hrs:  BP Temp Temp src Pulse Resp SpO2 Weight  12/06/16 2111 - - - - - 96 % -  12/06/16 1947 (!) 99/44 97.8 F (36.6 C) Oral 86 18 94 % -  12/06/16 1418 111/66 98.1 F (36.7 C) Oral (!) 104 18 96 % -  12/06/16 0505 (!) 140/55 97.7 F (36.5 C) Oral 86 20 94 % 159 lb 6.4 oz (72.3 kg)    Filed Weights   12/04/16 0459 12/05/16 0438 12/06/16 0505  Weight: 158 lb 12.8 oz (72 kg) 159 lb 4.8 oz (72.3 kg) 159 lb 6.4 oz (72.3 kg)    Hemodynamic parameters for last 24 hours:    Intake/Output from previous day: 08/12 0701 - 08/13 0700 In: 1496.6 [P.O.:720; I.V.:476.6; IV Piggyback:300] Out: 1750 [Urine:1750] Intake/Output this shift: No intake/output data recorded.  Scheduled Meds: . aspirin EC  81 mg Oral Daily  . atorvastatin  80 mg Oral Daily  . carvedilol  6.25 mg Oral BID WC  . [START ON 12/07/2016] chlorhexidine  15 mL Mouth/Throat Once  . Chlorhexidine Gluconate Cloth  6 each Topical Once   And  . Chlorhexidine Gluconate Cloth  6 each Topical Once  . fenofibrate  160 mg Oral Daily  . guaiFENesin  1,200 mg Oral BID  . [START ON 12/07/2016] heparin-papaverine-plasmalyte irrigation   Irrigation To OR  . insulin aspart  0-20 Units Subcutaneous TID WC  . insulin aspart  0-5 Units Subcutaneous QHS  . insulin glargine  20 Units Subcutaneous QHS  . insulin starter kit- pen needles  1 kit Other Once  . ipratropium-albuterol  3 mL Nebulization BID  . losartan  25 mg Oral Daily  . [START ON 12/07/2016] magnesium sulfate  40 mEq Other To OR  . [START ON 12/07/2016] metoprolol tartrate  12.5 mg Oral Once  . [START ON 12/07/2016] potassium chloride  80 mEq Other To OR    . potassium chloride  20 mEq Oral BID  . sodium chloride flush  3 mL Intravenous Q12H  . sodium chloride flush  3 mL Intravenous Q12H  . [START ON 12/07/2016] tranexamic acid  15 mg/kg Intravenous To OR  . [START ON 12/07/2016] tranexamic acid  2 mg/kg Intracatheter To OR   Continuous Infusions: . sodium chloride    . sodium chloride    . [START ON 12/07/2016] cefUROXime (ZINACEF)  IV    . [START ON 12/07/2016] cefUROXime (ZINACEF)  IV    . [START ON 12/07/2016] dexmedetomidine    . [START ON 12/07/2016] DOPamine    . [START ON 12/07/2016] epinephrine    . [START ON 12/07/2016] heparin 30,000 units/NS 1000 mL solution for CELLSAVER    . heparin 1,450 Units/hr (12/06/16 1716)  . [START ON 12/07/2016] insulin (NOVOLIN-R) infusion    . [START ON 12/07/2016] nitroGLYCERIN    . [START ON 12/07/2016] phenylephrine 30m/250mL NS (0.065mml) infusion    . [START ON 12/07/2016] tranexamic acid (CYKLOKAPRON) infusion ([facility])    . [START ON 12/07/2016] vancomycin  PRN Meds:.sodium chloride, sodium chloride, acetaminophen, calcium carbonate, ipratropium-albuterol, nitroGLYCERIN, ondansetron (ZOFRAN) IV, sodium chloride flush, sodium chloride flush, temazepam  General appearance: alert and cooperative Neurologic: intact Heart: regular rate and rhythm, S1, S2 normal, no murmur, click, rub or gallop Lungs: clear to auscultation bilaterally Abdomen: soft, non-tender; bowel sounds normal; no masses,  no organomegaly Extremities: extremities normal, atraumatic, no cyanosis or edema and Homans sign is negative, no sign of DVT   Lab Results: CBC:  Recent Labs  12/05/16 0254 12/06/16 0340  WBC 7.2 6.4  HGB 13.1 13.3  HCT 38.5* 38.9*  PLT 271 255   BMET:   Recent Labs  12/04/16 0434  NA 136  K 4.8  CL 104  CO2 23  GLUCOSE 186*  BUN 13  CREATININE 0.76  CALCIUM 9.0    PT/INR:   Recent Labs  12/06/16 1806  LABPROT 13.3  INR 1.01     Radiology No results  found.   Assessment/Plan: S/P Procedure(s) (LRB): RIGHT/LEFT HEART CATH AND CORONARY ANGIOGRAPHY (N/A) Plan to proceed with CABG as discussed with patient  The goals risks and alternatives of the planned surgical procedure CABG have been discussed with the patient in detail. The risks of the procedure including death, infection, stroke, myocardial infarction, bleeding, blood transfusion have all been discussed specifically.  I have quoted Grant Moran a 3 % of perioperative mortality and a complication rate as high as 40 %. The patient's questions have been answered.Grant Moran is willing  to proceed with the planned procedure.  Grant Isaac MD 12/06/2016 9:19 PM     Patient ID: Grant Moran, male   DOB: 24-Jan-1956, 61 y.o.   MRN: [id number]

## 2016-12-06 NOTE — Progress Notes (Addendum)
Physical Therapy Treatment and Discharge Patient Details Name: Grant Moran MRN: [id number] DOB: 01/13/1956 Today's Date: 12/06/2016    History of Present Illness 61 yo male with onset of elevated troponins, of CHF and requiring O2 initially.  Moving well and followed by Cardiac Rehab; plan for CABG 8/14; PMHx:  DM, HTN, Stroke, CAD, MI, TEE without cardioversion, angioplasty,     PT Comments    Noted [name] is walking well with Cardiac Rehab, and he is walking well with me today; all acute PT goals met; he is safe to walk the hallways independently or with staff; We discussed minimizing use of UEs for getting up and down with mobility, and he demonstrated technique well; plan for surgery tomorrow; No acute PT needs noted at this time -- Nursing and Cardiac Rehab can meet mobility needs; if PT needs are identified postop, please reorder   Follow Up Recommendations  No PT follow up;Other (comment) (Cardiac rehab phase 2; will need to find out about transport)     Equipment Recommendations  None recommended by PT    Recommendations for Other Services  (Continued Cardiac REhab)     Precautions / Restrictions Precautions Precautions: None    Mobility  Bed Mobility Overal bed mobility: Independent             General bed mobility comments: used sidelie to sit technique  Transfers Overall transfer level: Independent Equipment used: None Transfers: Sit to/from Stand Sit to Stand: Independent            Ambulation/Gait Ambulation/Gait assistance: Independent Ambulation Distance (Feet): 250 Feet Assistive device: None Gait Pattern/deviations: WFL(Within Functional Limits)     General Gait Details: Good self-monitor for activity tolerance   Stairs Stairs: Yes   Stair Management: One rail Right Number of Stairs: 4 General stair comments: hard footfall with first step coming down, but no loss of balance  Wheelchair Mobility    Modified Rankin (Stroke  Patients Only)       Balance     Sitting balance-Leahy Scale: Good       Standing balance-Leahy Scale: Good                              Cognition Arousal/Alertness: Awake/alert Behavior During Therapy: WFL for tasks assessed/performed Overall Cognitive Status: Within Functional Limits for tasks assessed                                        Exercises      General Comments General comments (skin integrity, edema, etc.): HR 108, O2 sats 99%, BP 99/65 post walk      Pertinent Vitals/Pain Pain Assessment: No/denies pain    Home Living                      Prior Function            PT Goals (current goals can now be found in the care plan section) Acute Rehab PT Goals Patient Stated Goal: to get stronger and get home Progress towards PT goals: Goals met/education completed, patient discharged from PT    Frequency    Other (Comment) (DC acute PT)      PT Plan Current plan remains appropriate    Co-evaluation              AM-PAC  PT "6 Clicks" Daily Activity  Outcome Measure  Difficulty turning over in bed (including adjusting bedclothes, sheets and blankets)?: None Difficulty moving from lying on back to sitting on the side of the bed? : None Difficulty sitting down on and standing up from a chair with arms (e.g., wheelchair, bedside commode, etc,.)?: None Help needed moving to and from a bed to chair (including a wheelchair)?: None Help needed walking in hospital room?: None Help needed climbing 3-5 steps with a railing? : None 6 Click Score: 24    End of Session   Activity Tolerance: Patient tolerated treatment well Patient left: in bed;with call bell/phone within reach Nurse Communication: Mobility status PT Visit Diagnosis: Unsteadiness on feet (R26.81);Muscle weakness (generalized) (M62.81);Difficulty in walking, not elsewhere classified (R26.2)     Time: 5170-0174 PT Time Calculation (min) (ACUTE  ONLY): 17 min  Charges:  $Gait Training: 8-22 mins                    G Codes:       Roney Marion, PT  Acute Rehabilitation Services Pager [phone] Office [place] 12/06/2016, 2:25 PM

## 2016-12-07 ENCOUNTER — Inpatient Hospital Stay (HOSPITAL_COMMUNITY): Payer: BLUE CROSS/BLUE SHIELD

## 2016-12-07 ENCOUNTER — Inpatient Hospital Stay (HOSPITAL_COMMUNITY): Payer: BLUE CROSS/BLUE SHIELD | Admitting: Anesthesiology

## 2016-12-07 ENCOUNTER — Inpatient Hospital Stay (HOSPITAL_COMMUNITY)
Admission: EM | Disposition: A | Discharge status: Home / Self Care | Payer: Self-pay | Source: Home / Self Care | Attending: Cardiothoracic Surgery

## 2016-12-07 DIAGNOSIS — Z951 Presence of aortocoronary bypass graft: Secondary | ICD-10-CM

## 2016-12-07 HISTORY — PX: CORONARY ARTERY BYPASS GRAFT: SHX141

## 2016-12-07 HISTORY — PX: TEE WITHOUT CARDIOVERSION: SHX5443

## 2016-12-07 LAB — POCT I-STAT 3, ART BLOOD GAS (G3+)
ACID-BASE DEFICIT: 2 mmol/L (ref 0.0–2.0)
ACID-BASE DEFICIT: 2 mmol/L (ref 0.0–2.0)
Acid-Base Excess: 2 mmol/L (ref 0.0–2.0)
Acid-Base Excess: 3 mmol/L — ABNORMAL HIGH (ref 0.0–2.0)
Acid-base deficit: 1 mmol/L (ref 0.0–2.0)
BICARBONATE: 25.2 mmol/L (ref 20.0–28.0)
BICARBONATE: 22.7 mmol/L (ref 20.0–28.0)
Bicarbonate: 25.8 mmol/L (ref 20.0–28.0)
Bicarbonate: 27.4 mmol/L (ref 20.0–28.0)
Bicarbonate: 24.9 mmol/L (ref 20.0–28.0)
Bicarbonate: 23.6 mmol/L (ref 20.0–28.0)
O2 SAT: 100 %
O2 SAT: 98 %
O2 Saturation: 100 %
O2 Saturation: 100 %
O2 Saturation: 98 %
O2 Saturation: 96 %
PCO2 ART: 34.7 mmHg (ref 32.0–48.0)
PCO2 ART: 40.7 mmHg (ref 32.0–48.0)
PH ART: 7.352 (ref 7.350–7.450)
PH ART: 7.388 (ref 7.350–7.450)
PO2 ART: 351 mmHg — AB (ref 83.0–108.0)
Patient temperature: 35.6
TCO2: 27 mmol/L (ref 0–100)
TCO2: 27 mmol/L (ref 0–100)
TCO2: 29 mmol/L (ref 0–100)
TCO2: 26 mmol/L (ref 0–100)
TCO2: 25 mmol/L (ref 0–100)
TCO2: 24 mmol/L (ref 0–100)
pCO2 arterial: 45.5 mmHg (ref 32.0–48.0)
pCO2 arterial: 41.6 mmHg (ref 32.0–48.0)
pCO2 arterial: 39 mmHg (ref 32.0–48.0)
pCO2 arterial: 36.9 mmHg (ref 32.0–48.0)
pH, Arterial: 7.479 — ABNORMAL HIGH (ref 7.350–7.450)
pH, Arterial: 7.426 (ref 7.350–7.450)
pH, Arterial: 7.386 (ref 7.350–7.450)
pH, Arterial: 7.394 (ref 7.350–7.450)
pO2, Arterial: 378 mmHg — ABNORMAL HIGH (ref 83.0–108.0)
pO2, Arterial: 331 mmHg — ABNORMAL HIGH (ref 83.0–108.0)
pO2, Arterial: 108 mmHg (ref 83.0–108.0)
pO2, Arterial: 101 mmHg (ref 83.0–108.0)
pO2, Arterial: 82 mmHg — ABNORMAL LOW (ref 83.0–108.0)

## 2016-12-07 LAB — POCT I-STAT, CHEM 8
BUN: 15 mg/dL (ref 6–20)
BUN: 13 mg/dL (ref 6–20)
BUN: 11 mg/dL (ref 6–20)
BUN: 12 mg/dL (ref 6–20)
BUN: 9 mg/dL (ref 6–20)
BUN: 10 mg/dL (ref 6–20)
BUN: 7 mg/dL (ref 6–20)
CHLORIDE: 102 mmol/L (ref 101–111)
CREATININE: 0.5 mg/dL — AB (ref 0.61–1.24)
CREATININE: 0.5 mg/dL — AB (ref 0.61–1.24)
CREATININE: 0.6 mg/dL — AB (ref 0.61–1.24)
Calcium, Ion: 1.28 mmol/L (ref 1.15–1.40)
Calcium, Ion: 1.25 mmol/L (ref 1.15–1.40)
Calcium, Ion: 1.04 mmol/L — ABNORMAL LOW (ref 1.15–1.40)
Calcium, Ion: 1.07 mmol/L — ABNORMAL LOW (ref 1.15–1.40)
Calcium, Ion: 0.93 mmol/L — ABNORMAL LOW (ref 1.15–1.40)
Calcium, Ion: 1.09 mmol/L — ABNORMAL LOW (ref 1.15–1.40)
Calcium, Ion: 1.15 mmol/L (ref 1.15–1.40)
Chloride: 101 mmol/L (ref 101–111)
Chloride: 98 mmol/L — ABNORMAL LOW (ref 101–111)
Chloride: 99 mmol/L — ABNORMAL LOW (ref 101–111)
Chloride: 96 mmol/L — ABNORMAL LOW (ref 101–111)
Chloride: 99 mmol/L — ABNORMAL LOW (ref 101–111)
Chloride: 103 mmol/L (ref 101–111)
Creatinine, Ser: 0.6 mg/dL — ABNORMAL LOW (ref 0.61–1.24)
Creatinine, Ser: 0.6 mg/dL — ABNORMAL LOW (ref 0.61–1.24)
Creatinine, Ser: 0.4 mg/dL — ABNORMAL LOW (ref 0.61–1.24)
Creatinine, Ser: 0.6 mg/dL — ABNORMAL LOW (ref 0.61–1.24)
GLUCOSE: 227 mg/dL — AB (ref 65–99)
GLUCOSE: 187 mg/dL — AB (ref 65–99)
Glucose, Bld: 217 mg/dL — ABNORMAL HIGH (ref 65–99)
Glucose, Bld: 157 mg/dL — ABNORMAL HIGH (ref 65–99)
Glucose, Bld: 136 mg/dL — ABNORMAL HIGH (ref 65–99)
Glucose, Bld: 159 mg/dL — ABNORMAL HIGH (ref 65–99)
Glucose, Bld: 148 mg/dL — ABNORMAL HIGH (ref 65–99)
HCT: 26 % — ABNORMAL LOW (ref 39.0–52.0)
HCT: 22 % — ABNORMAL LOW (ref 39.0–52.0)
HCT: 26 % — ABNORMAL LOW (ref 39.0–52.0)
HEMATOCRIT: 34 % — AB (ref 39.0–52.0)
HEMATOCRIT: 30 % — AB (ref 39.0–52.0)
HEMATOCRIT: 22 % — AB (ref 39.0–52.0)
HEMATOCRIT: 23 % — AB (ref 39.0–52.0)
HEMOGLOBIN: 11.6 g/dL — AB (ref 13.0–17.0)
HEMOGLOBIN: 8.8 g/dL — AB (ref 13.0–17.0)
HEMOGLOBIN: 7.5 g/dL — AB (ref 13.0–17.0)
HEMOGLOBIN: 8.8 g/dL — AB (ref 13.0–17.0)
Hemoglobin: 10.2 g/dL — ABNORMAL LOW (ref 13.0–17.0)
Hemoglobin: 7.5 g/dL — ABNORMAL LOW (ref 13.0–17.0)
Hemoglobin: 7.8 g/dL — ABNORMAL LOW (ref 13.0–17.0)
POTASSIUM: 4.8 mmol/L (ref 3.5–5.1)
POTASSIUM: 3.5 mmol/L (ref 3.5–5.1)
POTASSIUM: 3.9 mmol/L (ref 3.5–5.1)
Potassium: 4.9 mmol/L (ref 3.5–5.1)
Potassium: 4.1 mmol/L (ref 3.5–5.1)
Potassium: 4.4 mmol/L (ref 3.5–5.1)
Potassium: 4.5 mmol/L (ref 3.5–5.1)
SODIUM: 135 mmol/L (ref 135–145)
SODIUM: 133 mmol/L — AB (ref 135–145)
SODIUM: 134 mmol/L — AB (ref 135–145)
SODIUM: 134 mmol/L — AB (ref 135–145)
Sodium: 135 mmol/L (ref 135–145)
Sodium: 138 mmol/L (ref 135–145)
Sodium: 138 mmol/L (ref 135–145)
TCO2: 25 mmol/L (ref 0–100)
TCO2: 23 mmol/L (ref 0–100)
TCO2: 25 mmol/L (ref 0–100)
TCO2: 26 mmol/L (ref 0–100)
TCO2: 22 mmol/L (ref 0–100)
TCO2: 23 mmol/L (ref 0–100)
TCO2: 23 mmol/L (ref 0–100)

## 2016-12-07 LAB — GLUCOSE, CAPILLARY
GLUCOSE-CAPILLARY: 122 mg/dL — AB (ref 65–99)
GLUCOSE-CAPILLARY: 96 mg/dL (ref 65–99)
GLUCOSE-CAPILLARY: 87 mg/dL (ref 65–99)
GLUCOSE-CAPILLARY: 110 mg/dL — AB (ref 65–99)
GLUCOSE-CAPILLARY: 139 mg/dL — AB (ref 65–99)
GLUCOSE-CAPILLARY: 158 mg/dL — AB (ref 65–99)
Glucose-Capillary: 217 mg/dL — ABNORMAL HIGH (ref 65–99)
Glucose-Capillary: 80 mg/dL (ref 65–99)
Glucose-Capillary: 153 mg/dL — ABNORMAL HIGH (ref 65–99)

## 2016-12-07 LAB — CBC
HCT: 37.8 % — ABNORMAL LOW (ref 39.0–52.0)
HCT: 28.1 % — ABNORMAL LOW (ref 39.0–52.0)
HEMATOCRIT: 29.6 % — AB (ref 39.0–52.0)
HEMOGLOBIN: 13.1 g/dL (ref 13.0–17.0)
HEMOGLOBIN: 10.1 g/dL — AB (ref 13.0–17.0)
Hemoglobin: 9.5 g/dL — ABNORMAL LOW (ref 13.0–17.0)
MCH: 29.2 pg (ref 26.0–34.0)
MCH: 28.5 pg (ref 26.0–34.0)
MCH: 28.4 pg (ref 26.0–34.0)
MCHC: 34.7 g/dL (ref 30.0–36.0)
MCHC: 34.1 g/dL (ref 30.0–36.0)
MCHC: 33.8 g/dL (ref 30.0–36.0)
MCV: 84.2 fL (ref 78.0–100.0)
MCV: 83.4 fL (ref 78.0–100.0)
MCV: 84.1 fL (ref 78.0–100.0)
PLATELETS: 260 10*3/uL (ref 150–400)
Platelets: 167 10*3/uL (ref 150–400)
Platelets: 194 10*3/uL (ref 150–400)
RBC: 4.49 MIL/uL (ref 4.22–5.81)
RBC: 3.55 MIL/uL — ABNORMAL LOW (ref 4.22–5.81)
RBC: 3.34 MIL/uL — ABNORMAL LOW (ref 4.22–5.81)
RDW: 13.4 % (ref 11.5–15.5)
RDW: 13.4 % (ref 11.5–15.5)
RDW: 13.6 % (ref 11.5–15.5)
WBC: 6.2 10*3/uL (ref 4.0–10.5)
WBC: 7.7 10*3/uL (ref 4.0–10.5)
WBC: 10.8 10*3/uL — ABNORMAL HIGH (ref 4.0–10.5)

## 2016-12-07 LAB — PROTIME-INR
INR: 1.36
PROTHROMBIN TIME: 16.9 s — AB (ref 11.4–15.2)

## 2016-12-07 LAB — MAGNESIUM: Magnesium: 2.9 mg/dL — ABNORMAL HIGH (ref 1.7–2.4)

## 2016-12-07 LAB — BASIC METABOLIC PANEL
Anion gap: 9 (ref 5–15)
BUN: 18 mg/dL (ref 6–20)
CO2: 21 mmol/L — ABNORMAL LOW (ref 22–32)
Calcium: 9.2 mg/dL (ref 8.9–10.3)
Chloride: 102 mmol/L (ref 101–111)
Creatinine, Ser: 0.88 mg/dL (ref 0.61–1.24)
GFR calc Af Amer: >60 mL/min (ref >60)
GFR calc non Af Amer: >60 mL/min (ref >60)
Glucose, Bld: 204 mg/dL — ABNORMAL HIGH (ref 65–99)
Potassium: 4.4 mmol/L (ref 3.5–5.1)
Sodium: 132 mmol/L — ABNORMAL LOW (ref 135–145)

## 2016-12-07 LAB — PLATELET COUNT: Platelets: 169 10*3/uL (ref 150–400)

## 2016-12-07 LAB — HEMOGLOBIN A1C
HEMOGLOBIN A1C: 11.4 % — AB (ref 4.8–5.6)
Mean Plasma Glucose: 280 mg/dL

## 2016-12-07 LAB — HEPARIN LEVEL (UNFRACTIONATED): HEPARIN UNFRACTIONATED: 0.8 [IU]/mL — AB (ref 0.30–0.70)

## 2016-12-07 LAB — ECHO INTRAOPERATIVE TEE
Height: 63 in
Weight: 2531.2 oz

## 2016-12-07 LAB — POCT I-STAT 4, (NA,K, GLUC, HGB,HCT)
GLUCOSE: 136 mg/dL — AB (ref 65–99)
HEMATOCRIT: 29 % — AB (ref 39.0–52.0)
Hemoglobin: 9.9 g/dL — ABNORMAL LOW (ref 13.0–17.0)
Potassium: 3.3 mmol/L — ABNORMAL LOW (ref 3.5–5.1)
Sodium: 140 mmol/L (ref 135–145)

## 2016-12-07 LAB — HEMOGLOBIN AND HEMATOCRIT, BLOOD
HCT: 22.4 % — ABNORMAL LOW (ref 39.0–52.0)
Hemoglobin: 7.7 g/dL — ABNORMAL LOW (ref 13.0–17.0)

## 2016-12-07 LAB — CREATININE, SERUM
Creatinine, Ser: 0.69 mg/dL (ref 0.61–1.24)
GFR calc Af Amer: >60 mL/min (ref >60)
GFR calc non Af Amer: >60 mL/min (ref >60)

## 2016-12-07 LAB — APTT: APTT: 29 s (ref 24–36)

## 2016-12-07 SURGERY — CORONARY ARTERY BYPASS GRAFTING (CABG)
Anesthesia: General | Site: Chest

## 2016-12-07 MED ORDER — METOPROLOL TARTRATE 5 MG/5ML IV SOLN: 2.5000 mg | INTRAVENOUS | Status: DC | PRN

## 2016-12-07 MED ORDER — SODIUM CHLORIDE 0.9% FLUSH: 3.0000 mL | INTRAVENOUS | Status: DC | PRN

## 2016-12-07 MED ORDER — DOCUSATE SODIUM 100 MG PO CAPS
200.0000 mg | ORAL_CAPSULE | Freq: Every day | ORAL | Status: DC
  Administered 2016-12-08 – 2016-12-09 (×2): 200 mg via ORAL
  Filled 2016-12-07 (×2): qty 2

## 2016-12-07 MED ORDER — LACTATED RINGERS IV SOLN
INTRAVENOUS | Status: DC | PRN
  Administered 2016-12-07: 07:00:00 via INTRAVENOUS

## 2016-12-07 MED ORDER — FAMOTIDINE IN NACL 20-0.9 MG/50ML-% IV SOLN
20.0000 mg | Freq: Two times a day (BID) | INTRAVENOUS | Status: AC
  Administered 2016-12-07 (×2): 20 mg via INTRAVENOUS
  Filled 2016-12-07: qty 50

## 2016-12-07 MED ORDER — HEMOSTATIC AGENTS (NO CHARGE) OPTIME
TOPICAL | Status: DC | PRN
  Administered 2016-12-07 (×2): 1 via TOPICAL

## 2016-12-07 MED ORDER — MILRINONE LACTATE IN DEXTROSE 20-5 MG/100ML-% IV SOLN
0.1500 ug/kg/min | INTRAVENOUS | Status: AC
  Administered 2016-12-07: 0.3 ug/kg/min via INTRAVENOUS
  Administered 2016-12-08: 0.15 ug/kg/min via INTRAVENOUS
  Filled 2016-12-07 (×2): qty 100

## 2016-12-07 MED ORDER — ACETAMINOPHEN 500 MG PO TABS
1000.0000 mg | ORAL_TABLET | Freq: Four times a day (QID) | ORAL | Status: DC
  Administered 2016-12-07 – 2016-12-10 (×10): 1000 mg via ORAL
  Filled 2016-12-07 (×8): qty 2

## 2016-12-07 MED ORDER — DEXTROSE 5 % IV SOLN
INTRAVENOUS | Status: DC | PRN
  Administered 2016-12-07: 10 ug/min via INTRAVENOUS

## 2016-12-07 MED ORDER — METOPROLOL TARTRATE 12.5 MG HALF TABLET
12.5000 mg | ORAL_TABLET | Freq: Two times a day (BID) | ORAL | Status: DC
  Administered 2016-12-08 – 2016-12-09 (×4): 12.5 mg via ORAL
  Filled 2016-12-07 (×4): qty 1

## 2016-12-07 MED ORDER — MORPHINE SULFATE (PF) 4 MG/ML IV SOLN
2.0000 mg | INTRAVENOUS | Status: DC | PRN
  Filled 2016-12-07: qty 1

## 2016-12-07 MED ORDER — NITROGLYCERIN IN D5W 200-5 MCG/ML-% IV SOLN: 0.0000 ug/min | INTRAVENOUS | Status: DC

## 2016-12-07 MED ORDER — ORAL CARE MOUTH RINSE
15.0000 mL | Freq: Two times a day (BID) | OROMUCOSAL | Status: DC
  Administered 2016-12-08 – 2016-12-11 (×5): 15 mL via OROMUCOSAL

## 2016-12-07 MED ORDER — PROPOFOL 10 MG/ML IV BOLUS
INTRAVENOUS | Status: DC | PRN
  Administered 2016-12-07: 50 mg via INTRAVENOUS

## 2016-12-07 MED ORDER — LACTATED RINGERS IV SOLN
INTRAVENOUS | Status: DC | PRN
  Administered 2016-12-07: 09:00:00 via INTRAVENOUS

## 2016-12-07 MED ORDER — NOREPINEPHRINE BITARTRATE 1 MG/ML IV SOLN
0.0000 ug/min | INTRAVENOUS | Status: DC
  Administered 2016-12-07: 5 ug/min via INTRAVENOUS
  Administered 2016-12-08: 4 ug/min via INTRAVENOUS
  Filled 2016-12-07: qty 4

## 2016-12-07 MED ORDER — ONDANSETRON HCL 4 MG/2ML IJ SOLN
4.0000 mg | Freq: Four times a day (QID) | INTRAMUSCULAR | Status: DC | PRN
  Administered 2016-12-07: 4 mg via INTRAVENOUS
  Filled 2016-12-07: qty 2

## 2016-12-07 MED ORDER — ACETAMINOPHEN 160 MG/5ML PO SOLN: 650.0000 mg | Freq: Once | ORAL | Status: AC

## 2016-12-07 MED ORDER — SODIUM CHLORIDE 0.9 % IV SOLN
0.0000 ug/kg/h | INTRAVENOUS | Status: DC
  Filled 2016-12-07: qty 2

## 2016-12-07 MED ORDER — ACETAMINOPHEN 650 MG RE SUPP
650.0000 mg | Freq: Once | RECTAL | Status: AC
  Administered 2016-12-07: 650 mg via RECTAL

## 2016-12-07 MED ORDER — DEXTROSE 5 % IV SOLN
1.5000 g | Freq: Two times a day (BID) | INTRAVENOUS | Status: AC
  Administered 2016-12-07 – 2016-12-09 (×4): 1.5 g via INTRAVENOUS
  Filled 2016-12-07 (×4): qty 1.5

## 2016-12-07 MED ORDER — MILRINONE LACTATE IN DEXTROSE 20-5 MG/100ML-% IV SOLN
0.1250 ug/kg/min | INTRAVENOUS | Status: AC
  Administered 2016-12-07: 50 ug/kg/min via INTRAVENOUS
  Filled 2016-12-07: qty 100

## 2016-12-07 MED ORDER — MIDAZOLAM HCL 2 MG/2ML IJ SOLN
INTRAMUSCULAR | Status: AC
  Filled 2016-12-07: qty 2

## 2016-12-07 MED ORDER — INSULIN REGULAR BOLUS VIA INFUSION
0.0000 [IU] | Freq: Three times a day (TID) | INTRAVENOUS | Status: DC
  Administered 2016-12-08: 2 [IU] via INTRAVENOUS
  Filled 2016-12-07: qty 10

## 2016-12-07 MED ORDER — 0.9 % SODIUM CHLORIDE (POUR BTL) OPTIME
TOPICAL | Status: DC | PRN
  Administered 2016-12-07: 5000 mL

## 2016-12-07 MED ORDER — POTASSIUM CHLORIDE 10 MEQ/50ML IV SOLN
10.0000 meq | INTRAVENOUS | Status: AC
  Administered 2016-12-07 (×3): 10 meq via INTRAVENOUS

## 2016-12-07 MED ORDER — LACTATED RINGERS IV SOLN: 500.0000 mL | Freq: Once | INTRAVENOUS | Status: DC | PRN

## 2016-12-07 MED ORDER — MAGNESIUM SULFATE 4 GM/100ML IV SOLN
4.0000 g | Freq: Once | INTRAVENOUS | Status: DC
  Filled 2016-12-07: qty 100

## 2016-12-07 MED ORDER — ALBUMIN HUMAN 5 % IV SOLN
250.0000 mL | INTRAVENOUS | Status: DC | PRN
  Administered 2016-12-07 (×2): 250 mL via INTRAVENOUS

## 2016-12-07 MED ORDER — MIDAZOLAM HCL 5 MG/5ML IJ SOLN
INTRAMUSCULAR | Status: DC | PRN
  Administered 2016-12-07 (×2): 2 mg via INTRAVENOUS
  Administered 2016-12-07: 3 mg via INTRAVENOUS
  Administered 2016-12-07: 2 mg via INTRAVENOUS
  Administered 2016-12-07: 3 mg via INTRAVENOUS

## 2016-12-07 MED ORDER — SODIUM CHLORIDE 0.45 % IV SOLN: INTRAVENOUS | Status: DC | PRN

## 2016-12-07 MED ORDER — NOREPINEPHRINE BITARTRATE 1 MG/ML IV SOLN
0.0000 ug/min | INTRAVENOUS | Status: DC
  Filled 2016-12-07: qty 4

## 2016-12-07 MED ORDER — SODIUM CHLORIDE 0.9% FLUSH: 10.0000 mL | INTRAVENOUS | Status: DC | PRN

## 2016-12-07 MED ORDER — DOPAMINE-DEXTROSE 3.2-5 MG/ML-% IV SOLN: 2.5000 ug/kg/min | INTRAVENOUS | Status: DC

## 2016-12-07 MED ORDER — CHLORHEXIDINE GLUCONATE CLOTH 2 % EX PADS
6.0000 | MEDICATED_PAD | Freq: Every day | CUTANEOUS | Status: DC
  Administered 2016-12-07 – 2016-12-09 (×3): 6 via TOPICAL

## 2016-12-07 MED ORDER — GELATIN ABSORBABLE MT POWD
OROMUCOSAL | Status: DC | PRN
  Administered 2016-12-07 (×3): 4 mL via TOPICAL

## 2016-12-07 MED ORDER — ALBUMIN HUMAN 5 % IV SOLN
INTRAVENOUS | Status: DC | PRN
  Administered 2016-12-07 (×3): via INTRAVENOUS

## 2016-12-07 MED ORDER — FENTANYL CITRATE (PF) 250 MCG/5ML IJ SOLN
INTRAMUSCULAR | Status: DC | PRN
  Administered 2016-12-07: 250 ug via INTRAVENOUS
  Administered 2016-12-07: 100 ug via INTRAVENOUS
  Administered 2016-12-07: 300 ug via INTRAVENOUS
  Administered 2016-12-07: 750 ug via INTRAVENOUS
  Administered 2016-12-07: 100 ug via INTRAVENOUS

## 2016-12-07 MED ORDER — LACTATED RINGERS IV SOLN: INTRAVENOUS | Status: DC

## 2016-12-07 MED ORDER — SODIUM CHLORIDE 0.9% FLUSH
10.0000 mL | Freq: Two times a day (BID) | INTRAVENOUS | Status: DC
  Administered 2016-12-07 – 2016-12-09 (×5): 10 mL

## 2016-12-07 MED ORDER — SODIUM CHLORIDE 0.9 % IV SOLN
INTRAVENOUS | Status: DC
  Filled 2016-12-07: qty 1

## 2016-12-07 MED ORDER — BISACODYL 5 MG PO TBEC
10.0000 mg | DELAYED_RELEASE_TABLET | Freq: Every day | ORAL | Status: DC
  Administered 2016-12-08 – 2016-12-09 (×2): 10 mg via ORAL
  Filled 2016-12-07 (×2): qty 2

## 2016-12-07 MED ORDER — ARTIFICIAL TEARS OPHTHALMIC OINT
TOPICAL_OINTMENT | OPHTHALMIC | Status: DC | PRN
  Administered 2016-12-07: 1 via OPHTHALMIC

## 2016-12-07 MED ORDER — PROTAMINE SULFATE 10 MG/ML IV SOLN
INTRAVENOUS | Status: DC | PRN
  Administered 2016-12-07: 340 mg via INTRAVENOUS
  Administered 2016-12-07: 30 mg via INTRAVENOUS
  Administered 2016-12-07: 20 mg via INTRAVENOUS

## 2016-12-07 MED ORDER — SODIUM CHLORIDE 0.9 % IV SOLN
0.0000 ug/min | INTRAVENOUS | Status: DC
  Filled 2016-12-07: qty 2

## 2016-12-07 MED ORDER — CHLORHEXIDINE GLUCONATE 0.12 % MT SOLN
15.0000 mL | OROMUCOSAL | Status: AC
  Administered 2016-12-07: 15 mL via OROMUCOSAL

## 2016-12-07 MED ORDER — VANCOMYCIN HCL IN DEXTROSE 1-5 GM/200ML-% IV SOLN
1000.0000 mg | Freq: Once | INTRAVENOUS | Status: AC
  Administered 2016-12-07: 1000 mg via INTRAVENOUS
  Filled 2016-12-07: qty 200

## 2016-12-07 MED ORDER — MIDAZOLAM HCL 10 MG/2ML IJ SOLN
INTRAMUSCULAR | Status: AC
  Filled 2016-12-07: qty 2

## 2016-12-07 MED ORDER — SODIUM CHLORIDE 0.9 % IV SOLN: INTRAVENOUS | Status: DC

## 2016-12-07 MED ORDER — PANTOPRAZOLE SODIUM 40 MG PO TBEC
40.0000 mg | DELAYED_RELEASE_TABLET | Freq: Every day | ORAL | Status: DC
  Administered 2016-12-08 – 2016-12-09 (×2): 40 mg via ORAL
  Filled 2016-12-07 (×2): qty 1

## 2016-12-07 MED ORDER — NOREPINEPHRINE BITARTRATE 1 MG/ML IV SOLN
INTRAVENOUS | Status: DC | PRN
  Administered 2016-12-07: 2 ug/min via INTRAVENOUS

## 2016-12-07 MED ORDER — MORPHINE SULFATE (PF) 2 MG/ML IV SOLN: 2.0000 mg | INTRAVENOUS | Status: DC | PRN

## 2016-12-07 MED ORDER — PROPOFOL 10 MG/ML IV BOLUS
INTRAVENOUS | Status: AC
  Filled 2016-12-07: qty 20

## 2016-12-07 MED ORDER — ASPIRIN 81 MG PO CHEW: 324.0000 mg | CHEWABLE_TABLET | Freq: Every day | ORAL | Status: DC

## 2016-12-07 MED ORDER — LACTATED RINGERS IV SOLN
INTRAVENOUS | Status: DC | PRN
  Administered 2016-12-07 (×2): via INTRAVENOUS

## 2016-12-07 MED ORDER — BISACODYL 10 MG RE SUPP: 10.0000 mg | Freq: Every day | RECTAL | Status: DC

## 2016-12-07 MED ORDER — ORAL CARE MOUTH RINSE: 15.0000 mL | Freq: Four times a day (QID) | OROMUCOSAL | Status: DC

## 2016-12-07 MED ORDER — FENTANYL CITRATE (PF) 250 MCG/5ML IJ SOLN
INTRAMUSCULAR | Status: AC
  Filled 2016-12-07: qty 30

## 2016-12-07 MED ORDER — ASPIRIN EC 325 MG PO TBEC
325.0000 mg | DELAYED_RELEASE_TABLET | Freq: Every day | ORAL | Status: DC
  Administered 2016-12-08 – 2016-12-09 (×2): 325 mg via ORAL
  Filled 2016-12-07 (×2): qty 1

## 2016-12-07 MED ORDER — SODIUM CHLORIDE 0.9 % IV SOLN
250.0000 mL | INTRAVENOUS | Status: DC
  Administered 2016-12-08: 250 mL via INTRAVENOUS

## 2016-12-07 MED ORDER — MORPHINE SULFATE (PF) 2 MG/ML IV SOLN: 1.0000 mg | INTRAVENOUS | Status: DC | PRN

## 2016-12-07 MED ORDER — MORPHINE SULFATE (PF) 4 MG/ML IV SOLN
1.0000 mg | INTRAVENOUS | Status: DC | PRN
  Administered 2016-12-07: 2 mg via INTRAVENOUS

## 2016-12-07 MED ORDER — OXYCODONE HCL 5 MG PO TABS
5.0000 mg | ORAL_TABLET | ORAL | Status: DC | PRN
  Administered 2016-12-07 – 2016-12-09 (×4): 5 mg via ORAL
  Filled 2016-12-07 (×4): qty 1

## 2016-12-07 MED ORDER — MIDAZOLAM HCL 2 MG/2ML IJ SOLN: 2.0000 mg | INTRAMUSCULAR | Status: DC | PRN

## 2016-12-07 MED ORDER — ROCURONIUM BROMIDE 100 MG/10ML IV SOLN
INTRAVENOUS | Status: DC | PRN
  Administered 2016-12-07: 50 mg via INTRAVENOUS
  Administered 2016-12-07: 30 mg via INTRAVENOUS
  Administered 2016-12-07: 20 mg via INTRAVENOUS
  Administered 2016-12-07: 30 mg via INTRAVENOUS
  Administered 2016-12-07: 70 mg via INTRAVENOUS

## 2016-12-07 MED ORDER — SODIUM CHLORIDE 0.9% FLUSH
3.0000 mL | Freq: Two times a day (BID) | INTRAVENOUS | Status: DC
  Administered 2016-12-09: 3 mL via INTRAVENOUS

## 2016-12-07 MED ORDER — METOPROLOL TARTRATE 25 MG/10 ML ORAL SUSPENSION: 12.5000 mg | Freq: Two times a day (BID) | ORAL | Status: DC

## 2016-12-07 MED ORDER — CHLORHEXIDINE GLUCONATE 0.12% ORAL RINSE (MEDLINE KIT)
15.0000 mL | Freq: Two times a day (BID) | OROMUCOSAL | Status: DC
  Administered 2016-12-07: 15 mL via OROMUCOSAL

## 2016-12-07 MED ORDER — HEPARIN SODIUM (PORCINE) 1000 UNIT/ML IJ SOLN
INTRAMUSCULAR | Status: DC | PRN
  Administered 2016-12-07: 29000 [IU] via INTRAVENOUS
  Administered 2016-12-07: 5000 [IU] via INTRAVENOUS

## 2016-12-07 MED ORDER — TRAMADOL HCL 50 MG PO TABS: 50.0000 mg | ORAL_TABLET | ORAL | Status: DC | PRN

## 2016-12-07 MED ORDER — ACETAMINOPHEN 160 MG/5ML PO SOLN: 1000.0000 mg | Freq: Four times a day (QID) | ORAL | Status: DC

## 2016-12-07 MED FILL — Heparin Sodium (Porcine) Inj 1000 Unit/ML: INTRAMUSCULAR | Qty: 30 | Status: AC

## 2016-12-07 MED FILL — Magnesium Sulfate Inj 50%: INTRAMUSCULAR | Qty: 10 | Status: AC

## 2016-12-07 MED FILL — Potassium Chloride Inj 2 mEq/ML: INTRAVENOUS | Qty: 40 | Status: AC

## 2016-12-07 SURGICAL SUPPLY — 78 items
APL SKNCLS STERI-STRIP NONHPOA (GAUZE/BANDAGES/DRESSINGS) ×1
BAG DECANTER FOR FLEXI CONT (MISCELLANEOUS) ×3 IMPLANT
BANDAGE ACE 4X5 VEL STRL LF (GAUZE/BANDAGES/DRESSINGS) IMPLANT
BANDAGE ACE 6X5 VEL STRL LF (GAUZE/BANDAGES/DRESSINGS) IMPLANT
BANDAGE ELASTIC 4 VELCRO ST LF (GAUZE/BANDAGES/DRESSINGS) ×3 IMPLANT
BANDAGE ELASTIC 6 VELCRO ST LF (GAUZE/BANDAGES/DRESSINGS) ×3 IMPLANT
BENZOIN TINCTURE PRP APPL 2/3 (GAUZE/BANDAGES/DRESSINGS) ×3 IMPLANT
BLADE STERNUM SYSTEM 6 (BLADE) ×3 IMPLANT
BNDG GAUZE ELAST 4 BULKY (GAUZE/BANDAGES/DRESSINGS) ×3 IMPLANT
CANISTER SUCT 3000ML PPV (MISCELLANEOUS) ×3 IMPLANT
CATH CPB KIT GERHARDT (MISCELLANEOUS) ×3 IMPLANT
CATH THORACIC 28FR (CATHETERS) ×3 IMPLANT
CHLORAPREP W/TINT 10.5 ML (MISCELLANEOUS) ×3 IMPLANT
CRADLE DONUT ADULT HEAD (MISCELLANEOUS) ×3 IMPLANT
DRAIN CHANNEL 28F RND 3/8 FF (WOUND CARE) ×3 IMPLANT
DRAPE CARDIOVASCULAR INCISE (DRAPES) ×1
DRAPE SLUSH/WARMER DISC (DRAPES) ×3 IMPLANT
DRAPE SRG 135X102X78XABS (DRAPES) ×2 IMPLANT
DRSG AQUACEL AG ADV 3.5X14 (GAUZE/BANDAGES/DRESSINGS) ×3 IMPLANT
ELECT BLADE 4.0 EZ CLEAN MEGAD (MISCELLANEOUS) ×3
ELECT REM PT RETURN 9FT ADLT (ELECTROSURGICAL) ×6
ELECTRODE BLDE 4.0 EZ CLN MEGD (MISCELLANEOUS) ×2 IMPLANT
ELECTRODE REM PT RTRN 9FT ADLT (ELECTROSURGICAL) ×4 IMPLANT
FELT TEFLON 1X6 (MISCELLANEOUS) ×6 IMPLANT
GAUZE SPONGE 4X4 12PLY STRL (GAUZE/BANDAGES/DRESSINGS) IMPLANT
GAUZE SPONGE 4X4 12PLY STRL LF (GAUZE/BANDAGES/DRESSINGS) ×6 IMPLANT
GLOVE BIO SURGEON STRL SZ 6.5 (GLOVE) ×12 IMPLANT
GLOVE BIO SURGEON STRL SZ7 (GLOVE) ×6 IMPLANT
GLOVE BIOGEL M 6.5 STRL (GLOVE) ×24 IMPLANT
GLOVE BIOGEL M STER SZ 6 (GLOVE) ×3 IMPLANT
GLOVE BIOGEL M STRL SZ7.5 (GLOVE) ×6 IMPLANT
GLOVE BIOGEL PI IND STRL 6 (GLOVE) ×4 IMPLANT
GLOVE BIOGEL PI INDICATOR 6 (GLOVE) ×2
GOWN STRL REUS W/ TWL LRG LVL3 (GOWN DISPOSABLE) ×18 IMPLANT
GOWN STRL REUS W/TWL LRG LVL3 (GOWN DISPOSABLE) ×9
HEMOSTAT POWDER SURGIFOAM 1G (HEMOSTASIS) ×9 IMPLANT
HEMOSTAT SURGICEL 2X14 (HEMOSTASIS) ×3 IMPLANT
KIT BASIN OR (CUSTOM PROCEDURE TRAY) ×3 IMPLANT
KIT CATH SUCT 8FR (CATHETERS) IMPLANT
KIT ROOM TURNOVER OR (KITS) ×3 IMPLANT
KIT SUCTION CATH 14FR (SUCTIONS) ×6 IMPLANT
KIT VASOVIEW HEMOPRO VH 3000 (KITS) ×3 IMPLANT
LEAD PACING MYOCARDI (MISCELLANEOUS) ×3 IMPLANT
MARKER GRAFT CORONARY BYPASS (MISCELLANEOUS) ×6 IMPLANT
NS IRRIG 1000ML POUR BTL (IV SOLUTION) ×15 IMPLANT
PACK OPEN HEART (CUSTOM PROCEDURE TRAY) ×3 IMPLANT
PAD ARMBOARD 7.5X6 YLW CONV (MISCELLANEOUS) ×6 IMPLANT
PAD ELECT DEFIB RADIOL ZOLL (MISCELLANEOUS) ×3 IMPLANT
PENCIL BUTTON HOLSTER BLD 10FT (ELECTRODE) ×3 IMPLANT
PUNCH AORTIC ROTATE  4.5MM 8IN (MISCELLANEOUS) ×3 IMPLANT
SET CARDIOPLEGIA MPS 5001102 (MISCELLANEOUS) ×3 IMPLANT
SPONGE LAP 18X18 X RAY DECT (DISPOSABLE) ×3 IMPLANT
SURGIFLO W/THROMBIN 8M KIT (HEMOSTASIS) ×3 IMPLANT
SUT BONE WAX W31G (SUTURE) ×3 IMPLANT
SUT PROLENE 3 0 SH1 36 (SUTURE) ×3 IMPLANT
SUT PROLENE 4 0 TF (SUTURE) ×6 IMPLANT
SUT PROLENE 6 0 CC (SUTURE) ×9 IMPLANT
SUT PROLENE 7 0 BV1 MDA (SUTURE) ×3 IMPLANT
SUT PROLENE 7.0 RB 3 (SUTURE) ×3 IMPLANT
SUT PROLENE 8 0 BV175 6 (SUTURE) ×15 IMPLANT
SUT STEEL 6MS V (SUTURE) ×3 IMPLANT
SUT STEEL SZ 6 DBL 3X14 BALL (SUTURE) ×3 IMPLANT
SUT VIC AB 1 CTX 18 (SUTURE) ×6 IMPLANT
SUT VIC AB 1 CTX 36 (SUTURE) ×1
SUT VIC AB 1 CTX36XBRD ANBCTR (SUTURE) ×2 IMPLANT
SUT VIC AB 3-0 X1 27 (SUTURE) ×3 IMPLANT
SUTURE E-PAK OPEN HEART (SUTURE) ×3 IMPLANT
SYSTEM SAHARA CHEST DRAIN ATS (WOUND CARE) ×3 IMPLANT
TAPE CLOTH SURG 4X10 WHT LF (GAUZE/BANDAGES/DRESSINGS) ×6 IMPLANT
TAPE PAPER 2X10 WHT MICROPORE (GAUZE/BANDAGES/DRESSINGS) ×3 IMPLANT
TOWEL GREEN STERILE (TOWEL DISPOSABLE) ×3 IMPLANT
TOWEL GREEN STERILE FF (TOWEL DISPOSABLE) ×3 IMPLANT
TOWEL OR 17X24 6PK STRL BLUE (TOWEL DISPOSABLE) ×3 IMPLANT
TOWEL OR 17X26 10 PK STRL BLUE (TOWEL DISPOSABLE) IMPLANT
TRAY FOLEY SILVER 16FR TEMP (SET/KITS/TRAYS/PACK) ×3 IMPLANT
TUBING INSUFFLATION (TUBING) ×3 IMPLANT
UNDERPAD 30X30 (UNDERPADS AND DIAPERS) ×3 IMPLANT
WATER STERILE IRR 1000ML POUR (IV SOLUTION) ×6 IMPLANT

## 2016-12-07 NOTE — Progress Notes (Signed)
Pt dangled ~1hr post extubation. SGC pulled while transitioning pt from a lying to sitting position. Initial measurement was 50cm. Now ~35cm. Several PVC's observed. PAP waveform now flat. On call surgeon, Dr. Roxy Manns paged. OR RN answered page. Relayed message. No new orders given. On coming RN updated and will continue to monitor and assess pt closely.

## 2016-12-07 NOTE — Progress Notes (Signed)
  Echocardiogram Echocardiogram Transesophageal has been performed.  Grant Moran 12/07/2016, 11:47 AM

## 2016-12-07 NOTE — Transfer of Care (Signed)
Immediate Anesthesia Transfer of Care Note  Patient: Grant Moran  Procedure(s) Performed: Procedure(s): CORONARY ARTERY BYPASS GRAFTING (CABG) X 5  WITH ENDOSCOPIC HARVESTING OF RIGHT SAPHENOUS VEIN , LIMA-LAD SEQ SVG-OM1-OM2 SEQ SVG-PD-PL (N/A) TRANSESOPHAGEAL ECHOCARDIOGRAM (TEE) (N/A)  Patient Location: SICU  Anesthesia Type:General  Level of Consciousness: Patient remains intubated per anesthesia plan  Airway & Oxygen Therapy: Patient placed on Ventilator (see vital sign flow sheet for setting)  Post-op Assessment: Report given to RN and Post -op Vital signs reviewed and stable  Post vital signs: Reviewed and stable  Last Vitals:  Vitals:   12/06/16 2111 12/07/16 0442  BP:  111/63  Pulse:  82  Resp:  18  Temp:  36.5 C  SpO2: 96% 96%    Last Pain:  Vitals:   12/07/16 0442  TempSrc: Oral  PainSc:          Complications: No apparent anesthesia complications

## 2016-12-07 NOTE — Progress Notes (Signed)
Pre Procedure note for inpatients:   Grant Moran has been scheduled for Procedure(s): CORONARY ARTERY BYPASS GRAFTING (CABG) (N/A) TRANSESOPHAGEAL ECHOCARDIOGRAM (TEE) (N/A) today. The various methods of treatment have been discussed with the patient. After consideration of the risks, benefits and treatment options the patient has consented to the planned procedure.   The patient has been seen and labs reviewed. There are no changes in the patient's condition to prevent proceeding with the planned procedure today.  Recent labs:  Lab Results  Component Value Date   WBC 6.2 12/07/2016   HGB 13.1 12/07/2016   HCT 37.8 (L) 12/07/2016   PLT 260 12/07/2016   GLUCOSE 204 (H) 12/07/2016   CHOL 47 11/30/2016   TRIG 75 11/30/2016   HDL 21 (L) 11/30/2016   LDLCALC 11 11/30/2016   ALT 16 (L) 11/29/2016   AST 21 11/29/2016   NA 132 (L) 12/07/2016   K 4.4 12/07/2016   CL 102 12/07/2016   CREATININE 0.88 12/07/2016   BUN 18 12/07/2016   CO2 21 (L) 12/07/2016   TSH 3.158 11/29/2016   INR 1.01 12/06/2016   HGBA1C 11.4 (H) 12/06/2016    [name], MD 12/07/2016 7:23 AM

## 2016-12-07 NOTE — Anesthesia Procedure Notes (Signed)
Procedure Name: Intubation Date/Time: 12/07/2016 7:43 AM Performed by: Neldon Newport Pre-anesthesia Checklist: Patient identified, Emergency Drugs available, Suction available and Patient being monitored Patient Re-evaluated:Patient Re-evaluated prior to induction Oxygen Delivery Method: Circle system utilized Preoxygenation: Pre-oxygenation with 100% oxygen Induction Type: IV induction Ventilation: Two handed mask ventilation required Laryngoscope Size: Mac and 4 Grade View: Grade I Tube type: Oral Tube size: 8.0 mm Number of attempts: 1 Airway Equipment and Method: Stylet Placement Confirmation: ETT inserted through vocal cords under direct vision,  positive ETCO2 and breath sounds checked- equal and bilateral Secured at: 22 cm Tube secured with: Tape Dental Injury: Teeth and Oropharynx as per pre-operative assessment

## 2016-12-07 NOTE — Progress Notes (Signed)
Patient transferred to Short stay via stretcher. Report given to PACU RN. Patient belongings sent to 2 Heart and given to RN Mindy. CCMD notified

## 2016-12-07 NOTE — Anesthesia Procedure Notes (Signed)
Arterial Line Insertion Start/End8/14/2018 7:11 AM, 12/07/2016 7:20 AM Performed by: Roderic Palau  Patient location: Pre-op. Preanesthetic checklist: patient identified, IV checked, site marked, risks and benefits discussed, surgical consent, monitors and equipment checked, pre-op evaluation, timeout performed and anesthesia consent Lidocaine 1% used for infiltration Left, brachial was placed Catheter size: 20 Fr Hand hygiene performed , maximum sterile barriers used  and Seldinger technique used  Attempts: 1 Procedure performed using ultrasound guided technique. Ultrasound Notes:anatomy identified, needle tip was noted to be adjacent to the nerve/plexus identified, no ultrasound evidence of intravascular and/or intraneural injection and image(s) printed for medical record Following insertion, dressing applied, Biopatch and line sutured. Post procedure assessment: normal and unchanged  Patient tolerated the procedure well with no immediate complications.

## 2016-12-07 NOTE — Procedures (Signed)
Extubation Procedure Note  Patient Details:   Name: Grant Moran DOB: [birth year] MRN: [id number]   Airway Documentation:     Evaluation  O2 sats: stable throughout Complications: No apparent complications Patient did tolerate procedure well. Bilateral Breath Sounds: Clear, Diminished   Yes  PT was extubated to a 4L [place]  PT was able to speak  Satas are stable RT at bedside to monitor  nif (-30) VC (6.4L)  Tonya Wantz, Leonie Douglas 12/07/2016, 5:43 PM

## 2016-12-07 NOTE — Anesthesia Postprocedure Evaluation (Signed)
Anesthesia Post Note  Patient: SHAHRUKH PASCH  Procedure(s) Performed: Procedure(s) (LRB): CORONARY ARTERY BYPASS GRAFTING (CABG) X 5  WITH ENDOSCOPIC HARVESTING OF RIGHT SAPHENOUS VEIN , LIMA-LAD SEQ SVG-OM1-OM2 SEQ SVG-PD-PL (N/A) TRANSESOPHAGEAL ECHOCARDIOGRAM (TEE) (N/A)     Patient location during evaluation: SICU Anesthesia Type: General Level of consciousness: sedated Pain management: pain level controlled Vital Signs Assessment: post-procedure vital signs reviewed and stable Respiratory status: patient remains intubated per anesthesia plan Cardiovascular status: stable Anesthetic complications: no    Last Vitals:  Vitals:   12/07/16 0442 12/07/16 1418  BP: 111/63 (!) 99/59  Pulse: 82 88  Resp: 18 12  Temp: 36.5 C (!) 35.4 C  SpO2: 96% 98%    Last Pain:  Vitals:   12/07/16 0442  TempSrc: Oral  PainSc:                  Ansley Mangiapane,W. EDMOND

## 2016-12-07 NOTE — Anesthesia Procedure Notes (Signed)
Central Venous Catheter Insertion Performed by: Roderic Palau, anesthesiologist Start/End8/14/2018 7:00 AM, 12/07/2016 7:10 AM Patient location: Pre-op. Preanesthetic checklist: patient identified, IV checked, site marked, risks and benefits discussed, surgical consent, monitors and equipment checked, pre-op evaluation, timeout performed and anesthesia consent Position: Trendelenburg Lidocaine 1% used for infiltration and patient sedated Hand hygiene performed , maximum sterile barriers used  and Seldinger technique used Catheter size: 8.5 Fr Total catheter length 10. Central line and PA cath was placed.Sheath introducer Swan type:thermodilution PA Cath depth:50 Procedure performed using ultrasound guided technique. Ultrasound Notes:anatomy identified, needle tip was noted to be adjacent to the nerve/plexus identified, no ultrasound evidence of intravascular and/or intraneural injection and image(s) printed for medical record Attempts: 1 Following insertion, line sutured and dressing applied. Post procedure assessment: blood return through all ports, free fluid flow and no air  Patient tolerated the procedure well with no immediate complications.

## 2016-12-07 NOTE — Brief Op Note (Signed)
11/29/2016 - 12/07/2016  12:23 PM  PATIENT:  Grant Moran  61 y.o. male  PRE-OPERATIVE DIAGNOSIS:  CAD  POST-OPERATIVE DIAGNOSIS:  CAD  PROCEDURE:  Procedure(s): CORONARY ARTERY BYPASS GRAFTING (CABG) X 5  WITH ENDOSCOPIC HARVESTING OF RIGHT SAPHENOUS VEIN (N/A) TRANSESOPHAGEAL ECHOCARDIOGRAM (TEE) (N/A) LIMA-LAD SEQ SVG-OM1-OM2 SEQ SVG-PD-PL  SURGEON:  Surgeon(s) and Role:    * [name], MD - Primary  PHYSICIAN ASSISTANT: WAYNE GOLD PA-C  ANESTHESIA:   general  EBL:  Total I/O In: 3100 [I.V.:3100] Out: 1830 [Urine:1830]  BLOOD ADMINISTERED:none  DRAINS: 3 CHEST TUBES   LOCAL MEDICATIONS USED:  NONE  SPECIMEN:  No Specimen  DISPOSITION OF SPECIMEN:  N/A  COUNTS:  YES  TOURNIQUET:  * No tourniquets in log *  DICTATION: .Other Dictation: Dictation Number PENDING  PLAN OF CARE: Admit to inpatient   PATIENT DISPOSITION:  ICU - intubated and hemodynamically stable.   Delay start of Pharmacological VTE agent (>24hrs) due to surgical blood loss or risk of bleeding: yes  COMPLICATIONS: NO KNOWN

## 2016-12-07 NOTE — Progress Notes (Signed)
.  EKG CRITICAL VALUE     12 lead EKG performed.  Critical value noted.  Jarrett Soho, RN notified.   SCALES-PRICE,Janiel Derhammer, CCT 12/07/2016 3:09 PM

## 2016-12-07 NOTE — Progress Notes (Deleted)
  Echocardiogram 2D Echocardiogram has been performed.  Grant Moran 12/07/2016, 11:46 AM

## 2016-12-08 ENCOUNTER — Encounter (HOSPITAL_COMMUNITY): Payer: Self-pay | Admitting: Cardiothoracic Surgery

## 2016-12-08 ENCOUNTER — Inpatient Hospital Stay (HOSPITAL_COMMUNITY): Payer: BLUE CROSS/BLUE SHIELD

## 2016-12-08 DIAGNOSIS — Z951 Presence of aortocoronary bypass graft: Secondary | ICD-10-CM

## 2016-12-08 LAB — GLUCOSE, CAPILLARY
GLUCOSE-CAPILLARY: 117 mg/dL — AB (ref 65–99)
GLUCOSE-CAPILLARY: 97 mg/dL (ref 65–99)
GLUCOSE-CAPILLARY: 96 mg/dL (ref 65–99)
GLUCOSE-CAPILLARY: 96 mg/dL (ref 65–99)
GLUCOSE-CAPILLARY: 156 mg/dL — AB (ref 65–99)
GLUCOSE-CAPILLARY: 125 mg/dL — AB (ref 65–99)
GLUCOSE-CAPILLARY: 115 mg/dL — AB (ref 65–99)
GLUCOSE-CAPILLARY: 131 mg/dL — AB (ref 65–99)
GLUCOSE-CAPILLARY: 139 mg/dL — AB (ref 65–99)
Glucose-Capillary: 103 mg/dL — ABNORMAL HIGH (ref 65–99)
Glucose-Capillary: 107 mg/dL — ABNORMAL HIGH (ref 65–99)
Glucose-Capillary: 122 mg/dL — ABNORMAL HIGH (ref 65–99)
Glucose-Capillary: 142 mg/dL — ABNORMAL HIGH (ref 65–99)
Glucose-Capillary: 148 mg/dL — ABNORMAL HIGH (ref 65–99)
Glucose-Capillary: 160 mg/dL — ABNORMAL HIGH (ref 65–99)
Glucose-Capillary: 169 mg/dL — ABNORMAL HIGH (ref 65–99)
Glucose-Capillary: 197 mg/dL — ABNORMAL HIGH (ref 65–99)
Glucose-Capillary: 92 mg/dL (ref 65–99)
Glucose-Capillary: 92 mg/dL (ref 65–99)

## 2016-12-08 LAB — POCT I-STAT, CHEM 8
BUN: 3 mg/dL — AB (ref 6–20)
CALCIUM ION: 1.24 mmol/L (ref 1.15–1.40)
CHLORIDE: 101 mmol/L (ref 101–111)
CREATININE: 0.6 mg/dL — AB (ref 0.61–1.24)
Glucose, Bld: 161 mg/dL — ABNORMAL HIGH (ref 65–99)
HCT: 28 % — ABNORMAL LOW (ref 39.0–52.0)
Hemoglobin: 9.5 g/dL — ABNORMAL LOW (ref 13.0–17.0)
Potassium: 4.2 mmol/L (ref 3.5–5.1)
Sodium: 135 mmol/L (ref 135–145)
TCO2: 24 mmol/L (ref 0–100)

## 2016-12-08 LAB — CBC
HCT: 28.9 % — ABNORMAL LOW (ref 39.0–52.0)
HCT: 27.8 % — ABNORMAL LOW (ref 39.0–52.0)
HCT: 27 % — ABNORMAL LOW (ref 39.0–52.0)
HEMOGLOBIN: 9.8 g/dL — AB (ref 13.0–17.0)
Hemoglobin: 9.5 g/dL — ABNORMAL LOW (ref 13.0–17.0)
Hemoglobin: 9.2 g/dL — ABNORMAL LOW (ref 13.0–17.0)
MCH: 28.7 pg (ref 26.0–34.0)
MCH: 28.8 pg (ref 26.0–34.0)
MCH: 28.8 pg (ref 26.0–34.0)
MCHC: 33.9 g/dL (ref 30.0–36.0)
MCHC: 34.2 g/dL (ref 30.0–36.0)
MCHC: 34.1 g/dL (ref 30.0–36.0)
MCV: 84.8 fL (ref 78.0–100.0)
MCV: 84.2 fL (ref 78.0–100.0)
MCV: 84.6 fL (ref 78.0–100.0)
PLATELETS: 205 10*3/uL (ref 150–400)
Platelets: 172 10*3/uL (ref 150–400)
Platelets: 154 10*3/uL (ref 150–400)
RBC: 3.41 MIL/uL — ABNORMAL LOW (ref 4.22–5.81)
RBC: 3.3 MIL/uL — ABNORMAL LOW (ref 4.22–5.81)
RBC: 3.19 MIL/uL — ABNORMAL LOW (ref 4.22–5.81)
RDW: 14 % (ref 11.5–15.5)
RDW: 13.7 % (ref 11.5–15.5)
RDW: 14 % (ref 11.5–15.5)
WBC: 9.4 10*3/uL (ref 4.0–10.5)
WBC: 8.1 10*3/uL (ref 4.0–10.5)
WBC: 7.3 10*3/uL (ref 4.0–10.5)

## 2016-12-08 LAB — CREATININE, SERUM
Creatinine, Ser: 0.63 mg/dL (ref 0.61–1.24)
Creatinine, Ser: 0.65 mg/dL (ref 0.61–1.24)
GFR calc Af Amer: >60 mL/min (ref >60)
GFR calc Af Amer: >60 mL/min (ref >60)
GFR calc non Af Amer: >60 mL/min (ref >60)
GFR calc non Af Amer: >60 mL/min (ref >60)

## 2016-12-08 LAB — MAGNESIUM
MAGNESIUM: 2.5 mg/dL — AB (ref 1.7–2.4)
MAGNESIUM: 2.4 mg/dL (ref 1.7–2.4)

## 2016-12-08 LAB — BASIC METABOLIC PANEL
Anion gap: 6 (ref 5–15)
BUN: <5 mg/dL — ABNORMAL LOW (ref 6–20)
CHLORIDE: 104 mmol/L (ref 101–111)
CO2: 24 mmol/L (ref 22–32)
CREATININE: 0.52 mg/dL — AB (ref 0.61–1.24)
Calcium: 8.1 mg/dL — ABNORMAL LOW (ref 8.9–10.3)
GFR calc Af Amer: >60 mL/min (ref >60)
GFR calc non Af Amer: >60 mL/min (ref >60)
GLUCOSE: 92 mg/dL (ref 65–99)
POTASSIUM: 4.3 mmol/L (ref 3.5–5.1)
SODIUM: 134 mmol/L — AB (ref 135–145)

## 2016-12-08 MED ORDER — INSULIN DETEMIR 100 UNIT/ML ~~LOC~~ SOLN
20.0000 [IU] | Freq: Every day | SUBCUTANEOUS | Status: DC
  Administered 2016-12-09 – 2016-12-10 (×2): 20 [IU] via SUBCUTANEOUS
  Filled 2016-12-08 (×3): qty 0.2

## 2016-12-08 MED ORDER — INSULIN ASPART 100 UNIT/ML ~~LOC~~ SOLN
0.0000 [IU] | SUBCUTANEOUS | Status: DC
  Administered 2016-12-08 (×3): 2 [IU] via SUBCUTANEOUS
  Administered 2016-12-08: 4 [IU] via SUBCUTANEOUS
  Administered 2016-12-09: 12 [IU] via SUBCUTANEOUS
  Administered 2016-12-09: 2 [IU] via SUBCUTANEOUS
  Administered 2016-12-09: 8 [IU] via SUBCUTANEOUS
  Administered 2016-12-09: 4 [IU] via SUBCUTANEOUS

## 2016-12-08 MED ORDER — ENOXAPARIN SODIUM 30 MG/0.3ML ~~LOC~~ SOLN
30.0000 mg | Freq: Every day | SUBCUTANEOUS | Status: DC
  Administered 2016-12-08 – 2016-12-11 (×4): 30 mg via SUBCUTANEOUS
  Filled 2016-12-08 (×4): qty 0.3

## 2016-12-08 MED ORDER — INSULIN DETEMIR 100 UNIT/ML ~~LOC~~ SOLN
20.0000 [IU] | Freq: Once | SUBCUTANEOUS | Status: AC
  Administered 2016-12-08: 20 [IU] via SUBCUTANEOUS
  Filled 2016-12-08: qty 0.2

## 2016-12-08 MED FILL — Sodium Bicarbonate IV Soln 8.4%: INTRAVENOUS | Qty: 50 | Status: AC

## 2016-12-08 MED FILL — Electrolyte-R (PH 7.4) Solution: INTRAVENOUS | Qty: 3000 | Status: AC

## 2016-12-08 MED FILL — Mannitol IV Soln 20%: INTRAVENOUS | Qty: 500 | Status: AC

## 2016-12-08 MED FILL — Heparin Sodium (Porcine) Inj 1000 Unit/ML: INTRAMUSCULAR | Qty: 30 | Status: AC

## 2016-12-08 MED FILL — Lidocaine HCl IV Inj 20 MG/ML: INTRAVENOUS | Qty: 5 | Status: AC

## 2016-12-08 MED FILL — Sodium Chloride IV Soln 0.9%: INTRAVENOUS | Qty: 2000 | Status: AC

## 2016-12-08 NOTE — Progress Notes (Signed)
CT surgery p.m. Rounds  Stable blood pressure and hemodynamics Excellent urine output P.m. labs reviewed and are satisfactory Progressing well after multivessel CABG

## 2016-12-08 NOTE — Care Management Note (Signed)
Case Management Note  Patient Details  Name: Grant Moran MRN: [id number] Date of Birth: Jul 26, 1955  Subjective/Objective:    From home alone, POD 1 CABG,  Diuresis, d/c tubes, wean milrinone,               Action/Plan: NCM will follow for dc needs.   Expected Discharge Date:                  Expected Discharge Plan:  Home/Self Care  In-House Referral:  NA  Discharge planning Services  CM Consult  Post Acute Care Choice:  NA Choice offered to:  NA  DME Arranged:    DME Agency:     HH Arranged:    HH Agency:     Status of Service:  In process, will continue to follow  If discussed at Long Length of Stay Meetings, dates discussed:    Additional Comments:  Zenon Mayo, RN 12/08/2016, 12:39 PM

## 2016-12-08 NOTE — Progress Notes (Signed)
Patient ID: Grant Moran, male   DOB: 05-08-1955, 61 y.o.   MRN: [id number] TCTS DAILY ICU PROGRESS NOTE                   [address]            [place] [address]          [phone]   1 Day Post-Op Procedure(s) (LRB): CORONARY ARTERY BYPASS GRAFTING (CABG) X 5  WITH ENDOSCOPIC HARVESTING OF RIGHT SAPHENOUS VEIN , LIMA-LAD SEQ SVG-OM1-OM2 SEQ SVG-PD-PL (N/A) TRANSESOPHAGEAL ECHOCARDIOGRAM (TEE) (N/A)  Total Length of Stay:  LOS: 9 days   Subjective: awake and alert   Objective: Vital signs in last 24 hours: Temp:  [94.5 F (34.7 C)-98.1 F (36.7 C)] 98.1 F (36.7 C) (08/15 0831) Pulse Rate:  [44-104] 104 (08/15 0815) Cardiac Rhythm: Sinus tachycardia (08/15 0800) Resp:  [12-20] 17 (08/15 0815) BP: (71-133)/(44-67) 114/60 (08/15 0800) SpO2:  [95 %-100 %] 97 % (08/15 0815) Arterial Line BP: (95-155)/(31-67) 114/45 (08/15 0815) FiO2 (%):  [40 %-50 %] 40 % (08/14 1701) Weight:  [172 lb 2.9 oz (78.1 kg)] 172 lb 2.9 oz (78.1 kg) (08/15 0400)  Filed Weights   12/06/16 0505 12/07/16 0442 12/08/16 0400  Weight: 159 lb 6.4 oz (72.3 kg) 158 lb 3.2 oz (71.8 kg) 172 lb 2.9 oz (78.1 kg)    Weight change: 13 lb 15.7 oz (6.341 kg)   Hemodynamic parameters for last 24 hours: PAP: (14-38)/(1-28) 14/7 CO:  [5.2 L/min-6.7 L/min] 5.4 L/min CI:  [3 L/min/m2-3.8 L/min/m2] 3.1 L/min/m2  Intake/Output from previous day: 08/14 0701 - 08/15 0700 In: 9582.9 [I.V.:7107.9; Blood:150; IV Piggyback:2325] Out: 9983 [Urine:5475; Blood:600; Chest Tube:510]  Intake/Output this shift: Total I/O In: 157.7 [I.V.:157.7] Out: 200 [Urine:200]  Current Meds: Scheduled Meds: . acetaminophen  1,000 mg Oral Q6H   Or  . acetaminophen (TYLENOL) oral liquid 160 mg/5 mL  1,000 mg Per Tube Q6H  . aspirin EC  325 mg Oral Daily   Or  . aspirin  324 mg Per Tube Daily  . atorvastatin  80 mg Oral Daily  . bisacodyl  10 mg Oral Daily   Or  . bisacodyl  10 mg Rectal Daily  .  Chlorhexidine Gluconate Cloth  6 each Topical Daily  . docusate sodium  200 mg Oral Daily  . enoxaparin (LOVENOX) injection  30 mg Subcutaneous QHS  . insulin aspart  0-24 Units Subcutaneous Q4H  . insulin detemir  20 Units Subcutaneous Once  . [START ON 12/09/2016] insulin detemir  20 Units Subcutaneous Daily  . insulin regular  0-10 Units Intravenous TID WC  . mouth rinse  15 mL Mouth Rinse BID  . metoprolol tartrate  12.5 mg Oral BID   Or  . metoprolol tartrate  12.5 mg Per Tube BID  . pantoprazole  40 mg Oral Daily  . sodium chloride flush  10-40 mL Intracatheter Q12H  . sodium chloride flush  3 mL Intravenous Q12H   Continuous Infusions: . sodium chloride 20 mL/hr at 12/07/16 2000  . sodium chloride 250 mL (12/08/16 0600)  . sodium chloride 20 mL/hr at 12/07/16 2000  . albumin human    . cefUROXime (ZINACEF)  IV 1.5 g (12/08/16 0830)  . dexmedetomidine (PRECEDEX) IV infusion Stopped (12/07/16 1715)  . DOPamine 3 mcg/kg/min (12/08/16 0200)  . insulin (NOVOLIN-R) infusion 0.2 Units/hr (12/08/16 0800)  . lactated ringers    . lactated ringers 20 mL/hr at 12/07/16 2000  .  lactated ringers    . magnesium sulfate    . milrinone 0.3 mcg/kg/min (12/08/16 0200)  . nitroGLYCERIN Stopped (12/07/16 1400)  . norepinephrine (LEVOPHED) Adult infusion 1 mcg/min (12/08/16 0800)  . phenylephrine (NEO-SYNEPHRINE) Adult infusion Stopped (12/07/16 1415)   PRN Meds:.sodium chloride, albumin human, lactated ringers, metoprolol tartrate, midazolam, morphine injection, ondansetron (ZOFRAN) IV, oxyCODONE, sodium chloride flush, sodium chloride flush, traMADol  General appearance: alert, cooperative and no distress Neurologic: intact Heart: regular rate and rhythm, S1, S2 normal, no murmur, click, rub or gallop Lungs: diminished breath sounds bibasilar Abdomen: soft, non-tender; bowel sounds normal; no masses,  no organomegaly Extremities: extremities normal, atraumatic, no cyanosis or edema and  Homans sign is negative, no sign of DVT Wound: stable   Lab Results: CBC: Recent Labs  12/07/16 2000 12/07/16 2007 12/08/16 0404  WBC 10.8*  --  9.4  HGB 9.5* 8.8* 9.8*  HCT 28.1* 26.0* 28.9*  PLT 194  --  205   BMET:  Recent Labs  12/07/16 0357  12/07/16 2007 12/08/16 0404  NA 132*  < > 138 134*  K 4.4  < > 3.9 4.3  CL 102  < > 103 104  CO2 21*  --   --  24  GLUCOSE 204*  < > 148* 92  BUN 18  < > 7 <5*  CREATININE 0.88  < > 0.60* 0.52*  CALCIUM 9.2  --   --  8.1*  < > = values in this interval not displayed.  CMET: Lab Results  Component Value Date   WBC 9.4 12/08/2016   HGB 9.8 (L) 12/08/2016   HCT 28.9 (L) 12/08/2016   PLT 205 12/08/2016   GLUCOSE 92 12/08/2016   CHOL 47 11/30/2016   TRIG 75 11/30/2016   HDL 21 (L) 11/30/2016   LDLCALC 11 11/30/2016   ALT 16 (L) 11/29/2016   AST 21 11/29/2016   NA 134 (L) 12/08/2016   K 4.3 12/08/2016   CL 104 12/08/2016   CREATININE 0.52 (L) 12/08/2016   BUN <5 (L) 12/08/2016   CO2 24 12/08/2016   TSH 3.158 11/29/2016   INR 1.36 12/07/2016   HGBA1C 11.4 (H) 12/06/2016      PT/INR:  Recent Labs  12/07/16 1421  LABPROT 16.9*  INR 1.36   Radiology: Dg Chest Port 1 View  Result Date: 12/08/2016 CLINICAL DATA:  Status post CABG yesterday. EXAM: PORTABLE CHEST 1 VIEW COMPARISON:  Portable chest x-ray of December 07, 2016 FINDINGS: There has been interval extubation of the trachea and of the esophagus. The lungs are slightly better inflated but remain hypoinflated. There is no pneumothorax. The mediastinal drain and the left chest tube are in stable position. The cardiac silhouette is mildly enlarged. A right coronary artery stent is visible. The retrocardiac region is slightly more dense today. The Swan-Ganz catheter has been withdrawn such that the tip lies in the left ventricle. IMPRESSION: Slight interval improvement in inflation of both lungs. Persistent mild interstitial edema and bibasilar atelectasis. No  pneumothorax. Positioning of the Swan-Ganz catheter tip is now in the left ventricular chamber. Advancement or removal of the catheter is recommended. Critical Value/emergent results were called by telephone at the time of interpretation on 12/08/2016 at 7:41 am to San Jetty, RN, who verbally acknowledged these results. Electronically Signed   By: David  Martinique M.D.   On: 12/08/2016 07:47   Dg Chest Port 1 View  Result Date: 12/07/2016 CLINICAL DATA:  Status post coronary bypass grafting EXAM: PORTABLE CHEST  1 VIEW COMPARISON:  11/30/2016 FINDINGS: Endotracheal tube and nasogastric catheter are noted in satisfactory position. Mediastinal drain and left thoracostomy catheter are noted. Swan-Ganz catheter is seen in the right pulmonary artery. The inspiratory effort is poor with some basilar atelectasis. No pneumothorax or sizable effusion is seen. IMPRESSION: Postsurgical changes with tubes and lines as described. Poor inspiratory effort with basilar atelectasis. Electronically Signed   By: Inez Catalina M.D.   On: 12/07/2016 14:40     Assessment/Plan: S/P Procedure(s) (LRB): CORONARY ARTERY BYPASS GRAFTING (CABG) X 5  WITH ENDOSCOPIC HARVESTING OF RIGHT SAPHENOUS VEIN , LIMA-LAD SEQ SVG-OM1-OM2 SEQ SVG-PD-PL (N/A) TRANSESOPHAGEAL ECHOCARDIOGRAM (TEE) (N/A) Mobilize Diuresis Diabetes control d/c tubes/lines Continue foley due to strict I&O, patient in ICU and urinary output monitoring See progression orders Expected Acute  Blood - loss Anemia Wean milrinone as tolerated    [name] 12/08/2016 8:38 AM

## 2016-12-08 NOTE — Op Note (Signed)
NAME:  Grant Moran, Grant Moran NO.:  MEDICAL RECORD NO.:  [id number]  LOCATION:                                 FACILITY:  PHYSICIAN:  Lanelle Bal, MD         DATE OF BIRTH:  DATE OF PROCEDURE:  12/07/2016 DATE OF DISCHARGE:                              OPERATIVE REPORT   PREOPERATIVE DIAGNOSIS:  Coronary occlusive disease with unstable angina.  POSTOPERATIVE DIAGNOSIS:  Coronary occlusive disease with unstable angina.  SURGICAL PROCEDURE:  Coronary artery bypass grafting x5 with the left internal mammary to the left anterior descending coronary artery, sequential reverse saphenous vein graft to the first obtuse marginal and distal circumflex, sequential reverse saphenous vein graft to the posterior descending and posterior lateral branches of the right coronary artery, with right thigh greater saphenous vein harvest.  SURGEON:  Lanelle Bal, MD  FIRST ASSISTANT:  John Giovanni, PA-C  BRIEF HISTORY:  The patient is a 61 year old male with known coronary occlusive disease having previously had numerous stents placed in the right coronary artery.  He had been maintained on Plavix.  He presented urgently to the hospital after having prolonged chest pain and shortness of breath for several days, but worsened while at work.  He underwent cardiac catheterization by Dr. Peter Martinique, which demonstrated high- grade 3-vessel coronary artery disease throughout his old stent including distal disease involving the proximal PD and PL branches.  In addition, he had complex 70-80% stenosis in the LAD, 70% stenosis in the first obtuse marginal, following the takeoff of the first obtuse marginal vessel was totally occluded with faint collateral filling of the distal circumflex.  The patient's ejection fraction was depressed with ejection fraction approximately 30%.  Because of his diffuse 3- vessel coronary artery disease, coronary artery bypass grafting  was recommended.  After a period of washout of Plavix, we then proceeded with coronary artery bypass grafting.  Risks and options were discussed with the patient in detail, and he was willing and signed informed consent.  The patient is known to have severe diabetes, poorly controlled with a hemoglobin A1c of 10.  In addition, he also had no right radial pulse.  DESCRIPTION OF PROCEDURE:  The patient underwent general endotracheal anesthesia without incident.  Dr. Ola Spurr placed a TEE probe and confirming the preoperative studies of decreased global hypokinesis. Ejection fraction 30-40%, without valvular disease.  Skin of the chest and legs was prepped with Betadine and draped in usual sterile manner. Appropriate time-out was performed and we proceeded with harvesting endoscopically the right greater saphenous vein from the thigh.  Median sternotomy was performed.  Left internal mammary artery was dissected down as a pedicle graft.  The distal artery was divided, had good free flow.  The pericardium was opened as noted.  The patient did have evidence of left ventricular hypertrophy and no global hypokinesis.  He was systemically heparinized.  Ascending aorta was cannulated.  The right atrium was cannulated.  An aortic root vent cardioplegia needle was introduced into the ascending aorta.  The patient was placed on cardiopulmonary bypass 2.4 L/min/m2.  Sites of anastomosis were selected and  dissected out of the epicardium.  The patient's body temperature was cooled to 32 degrees.  Aortic crossclamp was applied and 600 mL of cold blood potassium cardioplegia was administered with diastolic arrest of the heart.  Myocardial septal temperature was monitored throughout the cross-clamp.  Attention was then turned first to the distal right coronary artery PD and PL branches.  In the midportion of the posterior descending, the vessel was opened.  The entire distal right coronary artery and  area was severely calcified and with the stent in place.  The proximal posterior descending vessel was also highly calcified.  With some difficulty, the posterior descending was opened in the midportion. A 1 mm probe passed proximally and distally.  Using a diamond-type side- to-side anastomosis with a second reverse saphenous vein graft, an 8-0 Prolene anastomosis was completed.  The distal extent of the same vein was then trimmed.  The patient had 2 posterior lateral branches, both appeared small.  The 1st posterior lateral branch was then opened and admitted a 1 mm probe distally, again using a running 8-0 Prolene, distal anastomosis was performed.  Attention was then turned to the first obtuse marginal vessel, which was opened and admitted a 1.5 mm probe distally, using a running 7-0 Prolene, a side-to-side anastomosis was performed with a segment of reverse saphenous vein graft.  The distal extent of the same vein was then carried to the distal circumflex, although this vessel was occluded on preoperative studies and under filled, the vessel was approximately 1.2 to 1.3 mm in size. Using a running 7-0 Prolene, distal anastomosis was performed. Attention was then turned to the left anterior descending coronary artery between the mid and distal third of the vessel.  The LAD was opened, admitted a 1.5 mm probe distally.  Using a running 8-0 Prolene, left internal mammary artery was anastomosed to the left anterior descending coronary artery.  With the cross-clamp still in place, 2 punch aortotomies were performed, and each of the two vein grafts were anastomosed to the ascending aorta.  The heart was allowed to passively fill and de-air.  The bulldog was removed from the mammary artery with prompt rise in myocardial septal temperature.  Aortic cross-clamp was removed with total cross-clamp time of 121 minutes.  The patient required electric defibrillation and returned to a sinus rhythm.   Atrial and ventricular pacing wires were applied.  The patient was loaded with milrinone and low-dose dopamine, and with this pressure support, he was then ventilated and weaned from cardiopulmonary bypass without difficulty.  He remained hemodynamically stable.  He was decannulated in usual fashion.  Protamine sulfate was administered with operative field hemostatic.  Graft markers were applied.  A left pleural tube and a Blake mediastinal drain were left in place.  The sternum was closed with #6 stainless steel wire.  Fascia was closed with interrupted 0 Vicryl, running 3-0 Vicryl in subcutaneous tissue, 4-0 subcuticular stitch in skin edges.  Dry dressings were applied.  Sponge and needle count was reported as correct at the completion of the procedure.  The patient tolerated the procedure without obvious complication and was transferred to Surgical Intensive Care Unit for further postoperative care.  Total pump time was 159 minutes.  The patient did not require any blood bank blood products during the operative procedure.     Lanelle Bal, MD     EG/MEDQ  D:  12/08/2016  T:  12/08/2016  Job:  [id number]

## 2016-12-08 NOTE — Progress Notes (Signed)
Patient noted prop to have hgba1c of 12 , indicating poorly controlled diabetic with hyperglycemia . [name] MD      [address] [place] [address] Office [phone]   [place]

## 2016-12-09 ENCOUNTER — Inpatient Hospital Stay (HOSPITAL_COMMUNITY): Payer: BLUE CROSS/BLUE SHIELD

## 2016-12-09 LAB — GLUCOSE, CAPILLARY
GLUCOSE-CAPILLARY: 113 mg/dL — AB (ref 65–99)
GLUCOSE-CAPILLARY: 90 mg/dL (ref 65–99)
Glucose-Capillary: 135 mg/dL — ABNORMAL HIGH (ref 65–99)
Glucose-Capillary: 270 mg/dL — ABNORMAL HIGH (ref 65–99)
Glucose-Capillary: 217 mg/dL — ABNORMAL HIGH (ref 65–99)

## 2016-12-09 LAB — CBC
HCT: 26.8 % — ABNORMAL LOW (ref 39.0–52.0)
Hemoglobin: 8.9 g/dL — ABNORMAL LOW (ref 13.0–17.0)
MCH: 28.4 pg (ref 26.0–34.0)
MCHC: 33.2 g/dL (ref 30.0–36.0)
MCV: 85.6 fL (ref 78.0–100.0)
Platelets: 148 10*3/uL — ABNORMAL LOW (ref 150–400)
RBC: 3.13 MIL/uL — ABNORMAL LOW (ref 4.22–5.81)
RDW: 14.2 % (ref 11.5–15.5)
WBC: 7.9 10*3/uL (ref 4.0–10.5)

## 2016-12-09 LAB — BASIC METABOLIC PANEL
Anion gap: 8 (ref 5–15)
BUN: 6 mg/dL (ref 6–20)
CO2: 23 mmol/L (ref 22–32)
Calcium: 8.4 mg/dL — ABNORMAL LOW (ref 8.9–10.3)
Chloride: 105 mmol/L (ref 101–111)
Creatinine, Ser: 0.64 mg/dL (ref 0.61–1.24)
GFR calc Af Amer: >60 mL/min (ref >60)
GFR calc non Af Amer: >60 mL/min (ref >60)
Glucose, Bld: 112 mg/dL — ABNORMAL HIGH (ref 65–99)
Potassium: 4 mmol/L (ref 3.5–5.1)
Sodium: 136 mmol/L (ref 135–145)

## 2016-12-09 MED ORDER — FUROSEMIDE 10 MG/ML IJ SOLN
40.0000 mg | Freq: Once | INTRAMUSCULAR | Status: AC
  Administered 2016-12-09: 40 mg via INTRAVENOUS
  Filled 2016-12-09: qty 4

## 2016-12-09 MED ORDER — CLOPIDOGREL BISULFATE 75 MG PO TABS
75.0000 mg | ORAL_TABLET | Freq: Every day | ORAL | Status: DC
  Administered 2016-12-09 – 2016-12-12 (×4): 75 mg via ORAL
  Filled 2016-12-09 (×4): qty 1

## 2016-12-09 NOTE — Progress Notes (Signed)
Patient ID: Grant Moran, male   DOB: 1956-03-11, 61 y.o.   MRN: [id number] SICU Evening Rounds:  Hemodynamically stable in sinus rhythm.  Dopamine and milrinone are off.  Diuresing well today.

## 2016-12-09 NOTE — Progress Notes (Signed)
Patient ID: Grant Moran, male   DOB: 02-23-1956, 61 y.o.   MRN: [id number] TCTS DAILY ICU PROGRESS NOTE                   [address]            [place] [address]          [phone]   2 Days Post-Op Procedure(s) (LRB): CORONARY ARTERY BYPASS GRAFTING (CABG) X 5  WITH ENDOSCOPIC HARVESTING OF RIGHT SAPHENOUS VEIN , [place] SEQ SVG-OM1-OM2 SEQ SVG-PD-PL (N/A) TRANSESOPHAGEAL ECHOCARDIOGRAM (TEE) (N/A)  Total Length of Stay:  LOS: 10 days   Subjective: Up to chair , alert , neuro intact   Objective: Vital signs in last 24 hours: Temp:  [98.1 F (36.7 C)-98.6 F (37 C)] 98.6 F (37 C) (08/16 0404) Pulse Rate:  [85-113] 96 (08/16 0730) Cardiac Rhythm: Sinus tachycardia (08/16 0400) Resp:  [14-20] 16 (08/16 0730) BP: (87-130)/(46-88) 115/61 (08/16 0730) SpO2:  [91 %-97 %] 95 % (08/16 0730) Arterial Line BP: (83-146)/(34-64) 130/62 (08/16 0730) Weight:  [168 lb 3.4 oz (76.3 kg)] 168 lb 3.4 oz (76.3 kg) (08/16 0404)  Filed Weights   12/07/16 0442 12/08/16 0400 12/09/16 0404  Weight: 158 lb 3.2 oz (71.8 kg) 172 lb 2.9 oz (78.1 kg) 168 lb 3.4 oz (76.3 kg)    Weight change: -3 lb 15.5 oz (-1.8 kg)   Hemodynamic parameters for last 24 hours:    Intake/Output from previous day: 08/15 0701 - 08/16 0700 In: 1206.9 [P.O.:240; I.V.:866.9; IV Piggyback:100] Out: 2865 [Urine:2725; Chest Tube:140]  Intake/Output this shift: No intake/output data recorded.  Current Meds: Scheduled Meds: . acetaminophen  1,000 mg Oral Q6H   Or  . acetaminophen (TYLENOL) oral liquid 160 mg/5 mL  1,000 mg Per Tube Q6H  . aspirin EC  325 mg Oral Daily   Or  . aspirin  324 mg Per Tube Daily  . atorvastatin  80 mg Oral Daily  . bisacodyl  10 mg Oral Daily   Or  . bisacodyl  10 mg Rectal Daily  . Chlorhexidine Gluconate Cloth  6 each Topical Daily  . docusate sodium  200 mg Oral Daily  . enoxaparin (LOVENOX) injection  30 mg Subcutaneous QHS  . insulin aspart  0-24 Units  Subcutaneous Q4H  . insulin detemir  20 Units Subcutaneous Daily  . mouth rinse  15 mL Mouth Rinse BID  . metoprolol tartrate  12.5 mg Oral BID   Or  . metoprolol tartrate  12.5 mg Per Tube BID  . pantoprazole  40 mg Oral Daily  . sodium chloride flush  10-40 mL Intracatheter Q12H  . sodium chloride flush  3 mL Intravenous Q12H   Continuous Infusions: . sodium chloride Stopped (12/08/16 0700)  . cefUROXime (ZINACEF)  IV 1.5 g (12/09/16 0745)  . DOPamine 2.5 mcg/kg/min (12/09/16 0500)  . lactated ringers    . lactated ringers 20 mL/hr at 12/09/16 0500  . lactated ringers    . magnesium sulfate    . milrinone 0.15 mcg/kg/min (12/09/16 0500)  . nitroGLYCERIN Stopped (12/07/16 1400)  . phenylephrine (NEO-SYNEPHRINE) Adult infusion Stopped (12/07/16 1415)   PRN Meds:.lactated ringers, metoprolol tartrate, midazolam, morphine injection, ondansetron (ZOFRAN) IV, oxyCODONE, sodium chloride flush, sodium chloride flush, traMADol  General appearance: alert and cooperative Neurologic: intact Heart: regular rate and rhythm, S1, S2 normal, no murmur, click, rub or gallop Lungs: diminished breath sounds bibasilar Abdomen: soft, non-tender; bowel sounds normal; no masses,  no organomegaly Extremities: extremities normal, atraumatic, no cyanosis or edema and Homans sign is negative, no sign of DVT Wound: sternum intact  Lab Results: CBC: Recent Labs  12/08/16 1615 12/08/16 1628 12/09/16 0309  WBC 7.3  --  7.9  HGB 9.2* 9.5* 8.9*  HCT 27.0* 28.0* 26.8*  PLT 154  --  148*   BMET:  Recent Labs  12/08/16 0404  12/08/16 1628 12/09/16 0309  NA 134*  --  135 136  K 4.3  --  4.2 4.0  CL 104  --  101 105  CO2 24  --   --  23  GLUCOSE 92  --  161* 112*  BUN <5*  --  3* 6  CREATININE 0.52*  < > 0.60* 0.64  CALCIUM 8.1*  --   --  8.4*  < > = values in this interval not displayed.  CMET: Lab Results  Component Value Date   WBC 7.9 12/09/2016   HGB 8.9 (L) 12/09/2016   HCT 26.8 (L)  12/09/2016   PLT 148 (L) 12/09/2016   GLUCOSE 112 (H) 12/09/2016   CHOL 47 11/30/2016   TRIG 75 11/30/2016   HDL 21 (L) 11/30/2016   LDLCALC 11 11/30/2016   ALT 16 (L) 11/29/2016   AST 21 11/29/2016   NA 136 12/09/2016   K 4.0 12/09/2016   CL 105 12/09/2016   CREATININE 0.64 12/09/2016   BUN 6 12/09/2016   CO2 23 12/09/2016   TSH 3.158 11/29/2016   INR 1.36 12/07/2016   HGBA1C 11.4 (H) 12/06/2016      PT/INR:  Recent Labs  12/07/16 1421  LABPROT 16.9*  INR 1.36   Radiology: Dg Chest Port 1 View  Result Date: 12/09/2016 CLINICAL DATA:  Follow-up chest tube EXAM: PORTABLE CHEST 1 VIEW COMPARISON:  12/08/2016 FINDINGS: Left-sided thoracostomy catheter is again seen and stable. No pneumothorax is noted. Right jugular sheath remains in place. The Swan-Ganz catheter has been removed. Mediastinal drain has been removed as well. Postsurgical changes are again seen. Small left pleural effusion is again noted and stable. Mild bibasilar atelectatic changes are seen. IMPRESSION: Stable atelectatic changes in left-sided pleural effusion. No other focal abnormality is noted. Electronically Signed   By: Inez Catalina M.D.   On: 12/09/2016 07:10     Assessment/Plan: S/P Procedure(s) (LRB): CORONARY ARTERY BYPASS GRAFTING (CABG) X 5  WITH ENDOSCOPIC HARVESTING OF RIGHT SAPHENOUS VEIN , [place]-LAD SEQ SVG-OM1-OM2 SEQ SVG-PD-PL (N/A) TRANSESOPHAGEAL ECHOCARDIOGRAM (TEE) (N/A) Mobilize Diuresis Diabetes control d/c tubes/lines See progression orders Wean off drips D/c aline, d/c foley  Renal function stable     [name] 12/09/2016 7:56 AM

## 2016-12-10 ENCOUNTER — Inpatient Hospital Stay (HOSPITAL_COMMUNITY): Payer: BLUE CROSS/BLUE SHIELD

## 2016-12-10 LAB — BASIC METABOLIC PANEL
Anion gap: 7 (ref 5–15)
BUN: 10 mg/dL (ref 6–20)
CO2: 27 mmol/L (ref 22–32)
Calcium: 8.4 mg/dL — ABNORMAL LOW (ref 8.9–10.3)
Chloride: 104 mmol/L (ref 101–111)
Creatinine, Ser: 0.77 mg/dL (ref 0.61–1.24)
GFR calc Af Amer: >60 mL/min (ref >60)
GFR calc non Af Amer: >60 mL/min (ref >60)
Glucose, Bld: 74 mg/dL (ref 65–99)
Potassium: 3.6 mmol/L (ref 3.5–5.1)
Sodium: 138 mmol/L (ref 135–145)

## 2016-12-10 LAB — CBC
HCT: 26.4 % — ABNORMAL LOW (ref 39.0–52.0)
Hemoglobin: 8.7 g/dL — ABNORMAL LOW (ref 13.0–17.0)
MCH: 28.2 pg (ref 26.0–34.0)
MCHC: 33 g/dL (ref 30.0–36.0)
MCV: 85.7 fL (ref 78.0–100.0)
Platelets: 165 10*3/uL (ref 150–400)
RBC: 3.08 MIL/uL — ABNORMAL LOW (ref 4.22–5.81)
RDW: 14 % (ref 11.5–15.5)
WBC: 7.9 10*3/uL (ref 4.0–10.5)

## 2016-12-10 LAB — GLUCOSE, CAPILLARY
GLUCOSE-CAPILLARY: 81 mg/dL (ref 65–99)
GLUCOSE-CAPILLARY: 133 mg/dL — AB (ref 65–99)
Glucose-Capillary: 263 mg/dL — ABNORMAL HIGH (ref 65–99)
Glucose-Capillary: 173 mg/dL — ABNORMAL HIGH (ref 65–99)
Glucose-Capillary: 254 mg/dL — ABNORMAL HIGH (ref 65–99)
Glucose-Capillary: 216 mg/dL — ABNORMAL HIGH (ref 65–99)
Glucose-Capillary: 169 mg/dL — ABNORMAL HIGH (ref 65–99)

## 2016-12-10 MED ORDER — ASPIRIN EC 81 MG PO TBEC
81.0000 mg | DELAYED_RELEASE_TABLET | Freq: Every day | ORAL | Status: DC
  Administered 2016-12-10 – 2016-12-12 (×3): 81 mg via ORAL
  Filled 2016-12-10 (×3): qty 1

## 2016-12-10 MED ORDER — ONDANSETRON HCL 4 MG/2ML IJ SOLN: 4.0000 mg | Freq: Four times a day (QID) | INTRAMUSCULAR | Status: DC | PRN

## 2016-12-10 MED ORDER — OXYCODONE HCL 5 MG PO TABS: 5.0000 mg | ORAL_TABLET | ORAL | Status: DC | PRN

## 2016-12-10 MED ORDER — ONDANSETRON HCL 4 MG PO TABS: 4.0000 mg | ORAL_TABLET | Freq: Four times a day (QID) | ORAL | Status: DC | PRN

## 2016-12-10 MED ORDER — INSULIN ASPART 100 UNIT/ML ~~LOC~~ SOLN
0.0000 [IU] | Freq: Three times a day (TID) | SUBCUTANEOUS | Status: DC
  Administered 2016-12-10: 12 [IU] via SUBCUTANEOUS
  Administered 2016-12-10: 8 [IU] via SUBCUTANEOUS
  Administered 2016-12-10: 4 [IU] via SUBCUTANEOUS
  Administered 2016-12-10: 2 [IU] via SUBCUTANEOUS
  Administered 2016-12-11: 4 [IU] via SUBCUTANEOUS
  Administered 2016-12-11: 8 [IU] via SUBCUTANEOUS
  Administered 2016-12-11 (×2): 4 [IU] via SUBCUTANEOUS
  Administered 2016-12-12: 2 [IU] via SUBCUTANEOUS

## 2016-12-10 MED ORDER — LISINOPRIL 2.5 MG PO TABS
2.5000 mg | ORAL_TABLET | Freq: Every day | ORAL | Status: DC
  Administered 2016-12-10 – 2016-12-11 (×2): 2.5 mg via ORAL
  Filled 2016-12-10: qty 1

## 2016-12-10 MED ORDER — FUROSEMIDE 40 MG PO TABS
40.0000 mg | ORAL_TABLET | Freq: Every day | ORAL | Status: AC
  Administered 2016-12-10 – 2016-12-12 (×3): 40 mg via ORAL
  Filled 2016-12-10 (×3): qty 1

## 2016-12-10 MED ORDER — MOVING RIGHT ALONG BOOK
Freq: Once | Status: AC
  Administered 2016-12-10: 08:00:00
  Filled 2016-12-10: qty 1

## 2016-12-10 MED ORDER — METOPROLOL TARTRATE 12.5 MG HALF TABLET
12.5000 mg | ORAL_TABLET | Freq: Two times a day (BID) | ORAL | Status: DC
  Administered 2016-12-10: 12.5 mg via ORAL
  Filled 2016-12-10: qty 1

## 2016-12-10 MED ORDER — ACETAMINOPHEN 325 MG PO TABS: 650.0000 mg | ORAL_TABLET | Freq: Four times a day (QID) | ORAL | Status: DC | PRN

## 2016-12-10 MED ORDER — BISACODYL 5 MG PO TBEC: 10.0000 mg | DELAYED_RELEASE_TABLET | Freq: Every day | ORAL | Status: DC | PRN

## 2016-12-10 MED ORDER — CLOPIDOGREL BISULFATE 75 MG PO TABS: 75.0000 mg | ORAL_TABLET | Freq: Every day | ORAL | Status: DC

## 2016-12-10 MED ORDER — SODIUM CHLORIDE 0.9% FLUSH: 3.0000 mL | INTRAVENOUS | Status: DC | PRN

## 2016-12-10 MED ORDER — POTASSIUM CHLORIDE CRYS ER 20 MEQ PO TBCR
20.0000 meq | EXTENDED_RELEASE_TABLET | Freq: Every day | ORAL | Status: DC
  Administered 2016-12-10 – 2016-12-12 (×3): 20 meq via ORAL
  Filled 2016-12-10 (×3): qty 1

## 2016-12-10 MED ORDER — SODIUM CHLORIDE 0.9% FLUSH
3.0000 mL | Freq: Two times a day (BID) | INTRAVENOUS | Status: DC
  Administered 2016-12-10: 10 mL via INTRAVENOUS
  Administered 2016-12-10 – 2016-12-11 (×3): 3 mL via INTRAVENOUS

## 2016-12-10 MED ORDER — TRAMADOL HCL 50 MG PO TABS: 50.0000 mg | ORAL_TABLET | ORAL | Status: DC | PRN

## 2016-12-10 MED ORDER — SODIUM CHLORIDE 0.9 % IV SOLN: 250.0000 mL | INTRAVENOUS | Status: DC | PRN

## 2016-12-10 MED ORDER — PANTOPRAZOLE SODIUM 40 MG PO TBEC
40.0000 mg | DELAYED_RELEASE_TABLET | Freq: Every day | ORAL | Status: DC
  Administered 2016-12-10 – 2016-12-12 (×3): 40 mg via ORAL
  Filled 2016-12-10 (×3): qty 1

## 2016-12-10 MED ORDER — DOCUSATE SODIUM 100 MG PO CAPS
200.0000 mg | ORAL_CAPSULE | Freq: Every day | ORAL | Status: DC
  Administered 2016-12-10 – 2016-12-11 (×2): 200 mg via ORAL
  Filled 2016-12-10 (×3): qty 2

## 2016-12-10 MED ORDER — BISACODYL 10 MG RE SUPP: 10.0000 mg | Freq: Every day | RECTAL | Status: DC | PRN

## 2016-12-10 MED ORDER — METOPROLOL TARTRATE 25 MG PO TABS
25.0000 mg | ORAL_TABLET | Freq: Two times a day (BID) | ORAL | Status: DC
  Administered 2016-12-10 – 2016-12-12 (×4): 25 mg via ORAL
  Filled 2016-12-10 (×4): qty 1

## 2016-12-10 NOTE — Discharge Summary (Signed)
Physician Discharge Summary  Patient ID: Grant Moran MRN: [id number] DOB/AGE: [birth year] 61 y.o.  Admit date: 11/29/2016 Discharge date: 12/12/2016  Admission Diagnoses: Patient Active Problem List   Diagnosis Date Noted  . NSTEMI (non-ST elevated myocardial infarction) ([place]) 12/02/2016  . CHF (congestive heart failure) ([place]) 11/29/2016  . Acute on chronic systolic CHF (congestive heart failure) ([facility]) 11/29/2016  . CAP (community acquired pneumonia) 11/29/2016  . Cerebrovascular accident (CVA) due to embolism of cerebral artery ([place]) 08/01/2015  . Type 2 diabetes mellitus with circulatory disorder ([place]) 08/01/2015  . Essential hypertension 08/01/2015  . Diabetic mononeuropathy associated with type 2 diabetes mellitus ([place]) 06/03/2015  . 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 HLD (hyperlipidemia) 06/03/2015  . CAD (coronary artery disease) 11/16/2011    Discharge Diagnoses:  Principal Problem:   Acute on chronic systolic CHF (congestive heart failure) (H[facility]) Active Problems:   CAD (coronary artery disease)   Cerebral infarction (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 He notes sudden worsening sob after getting to work and went to [facility] ER. He has history of mild SOB with exertion previously  Occasionally at night. He had percutaneous intervention on   right coronary 2001 .  He has  medical history significant of diabetes mellitus- poorly controlled , hyperlipidemia, hypertension, history of CVA with no residual weakness.   2-D echo from  05/02/2015 with a EF of 78-29%, grade 1 diastolic dysfunction, history of MI 2001 with ulcerated lesion in the RCA status post BMS with restenosis and treatment with angioplasty and brachytherapy 04/21/2000 with residual 40M/PLA and proximal LAD disease 70%. Patient states shortness of breath occurring at rest and on exertion. Patient denies any fevers, no chills, no syncope, no dizziness, no lightheadedness, no chest pain, no diarrhea, no constipation, no melena, no hematemesis, no hematochezia, no sick contacts, no lower extremity edema, no recent weight gain, no recent TRAVEL, no recent surgeries.  In  the ED with comprehensive metabolic profile with a potassium of 3.4, glucose of 232 otherwise was within normal limits. BNP was elevated at 1413. First set of troponin was elevated at 0.49.CBC done was unremarkable. D-dimer was 0.28. Chest x-ray done showed bronchitic type markings, patchy reticular density about the right hilum could be atelectasis or bronchopneumonia. Patient given a dose of IV Rocephin and azithromycin. Admitted Monday to [facility] and then transferred to [facility].   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 Servando Snare. He tolerated the procedure well and was transferred to the ICU. He was extubated in a timely manner. Postop day 1 we discontinued his chest tubes and lines. We initiated diuretic regimen for fluid overload. We began to mobilize the patient as tolerated. We continued the Foley due to strict I's and O's during intensive care. We weaned milrinone as tolerated. Postop day 2 we worked on diabetic control. This patient  has an A1c of 12, therefore they were poorly controlled before hospitalization. We discontinued his Foley catheter. His renal function remained stable. We continued to wean drips. Dopamine and  milrinone were off by the end of the day. Postop day 3 we continued care. We replaced potassium due to hypokalemia. We plan to transfer to the stepdown unit for continued care. On the floor the patient continued to progress. He was tolerating room air. He was ambulating in the halls independently without issue. His pacing wires were removed on 12/12/2016. His incisions were healing well. He was stable for discharge.   Consults: None  Significant Diagnostic Studies:  CLINICAL DATA:  Status post chest tube removal  EXAM: PORTABLE CHEST 1 VIEW  COMPARISON:  12/09/2016  FINDINGS: Left-sided chest tube has been removed. Right jugular sheath remains in place. No pneumothorax is noted. Mild bibasilar atelectasis is seen. Postsurgical changes are again noted. Cardiac shadow is stable.  IMPRESSION: No pneumothorax following chest tube removal. Persistent bibasilar atelectasis is noted   Electronically Signed   By: Inez Catalina M.D.   On: 12/10/2016 07:12       Treatments:  NAME:  IDRIS, EDMUNDSON                      ACCOUNT NO.:  MEDICAL RECORD NO.:  [id number]  LOCATION:                                 FACILITY:  PHYSICIAN:  Lanelle Bal, MD         DATE OF BIRTH:  DATE OF PROCEDURE:  12/07/2016 DATE OF DISCHARGE:                              OPERATIVE REPORT   PREOPERATIVE DIAGNOSIS:  Coronary occlusive disease with unstable angina.  POSTOPERATIVE DIAGNOSIS:  Coronary occlusive disease with unstable angina.  SURGICAL PROCEDURE:  Coronary artery bypass grafting x5 with the left internal mammary to the left anterior descending coronary artery, sequential reverse saphenous vein graft to the first obtuse marginal and distal circumflex, sequential reverse saphenous vein graft to the posterior descending and posterior lateral branches of the right coronary artery, with right thigh greater saphenous vein harvest.  SURGEON:  Lanelle Bal, MD  FIRST  ASSISTANT:  John Giovanni, PA-C   Discharge Exam: Blood pressure (!) 104/51, pulse 79, temperature 98.6 F (37 C), temperature source Oral, resp. rate 19, height 5\' 3"  (1.6 m), weight 74.3 kg (163 lb 12.8 oz), SpO2 96 %.   General appearance: alert, cooperative and no distress Heart: sinus tachycardia Lungs: clear to auscultation bilaterally Abdomen: soft, non-tender; bowel sounds normal; no masses,  no organomegaly Extremities: extremities normal, atraumatic, no cyanosis or edema Wound: clean and dry  Disposition: 01-Home or Self Care  Discharge Instructions    Amb Referral to Cardiac Rehabilitation    Complete by:  As directed    Diagnosis:   CABG NSTEMI     CABG X ___:  5     Allergies as of 12/12/2016   No Known Allergies     Medication List    STOP taking these medications   metoprolol succinate 100 MG 24 hr tablet Commonly known as:  TOPROL-XL   nitroGLYCERIN 0.4 MG SL tablet Commonly known as:  NITROSTAT     TAKE these medications   aspirin 81 MG EC  tablet Take 1 tablet (81 mg total) by mouth daily.   atorvastatin 80 MG tablet Commonly known as:  LIPITOR Take 1 tablet by mouth daily.   calcium carbonate 500 MG chewable tablet Commonly known as:  TUMS - dosed in mg elemental calcium Chew 2 tablets by mouth daily as needed for indigestion or heartburn.   clopidogrel 75 MG tablet Commonly known as:  PLAVIX Take 1 tablet (75 mg total) by mouth daily.   fenofibrate 160 MG tablet Take 160 mg by mouth daily.   furosemide 40 MG tablet Commonly known as:  LASIX Take 1 tablet (40 mg total) by mouth daily.   glyBURIDE-metformin 5-500 MG tablet Commonly known as:  GLUCOVANCE Take 1 tablet by mouth 2 (two) times daily.   INVOKANA 300 MG Tabs tablet Generic drug:  canagliflozin Take 300 mg by mouth daily.   JANUVIA 100 MG tablet Generic drug:  sitaGLIPtin Take by mouth.   losartan 50 MG tablet Commonly known as:  COZAAR Take 1 tablet (50 mg total)  by mouth daily.   metoprolol tartrate 25 MG tablet Commonly known as:  LOPRESSOR Take 1 tablet (25 mg total) by mouth 2 (two) times daily.   oxyCODONE 5 MG immediate release tablet Commonly known as:  Oxy IR/ROXICODONE Take 1 tablet (5 mg total) by mouth every 4 (four) hours as needed for severe pain.   potassium chloride SA 20 MEQ tablet Commonly known as:  K-DUR,KLOR-CON Take 1 tablet (20 mEq total) by mouth daily.      Follow-up Information    [name], MD Follow up.   Specialty:  Cardiothoracic Surgery Why:  Your appointment is on 01/13/2017 at 11:00am. PLease arrive at 10:30am for a chest xray located at [facility] which is on the first floor of our building.  Contact information: [address] [place] [address] [phone]        Nahser, Wonda Cheng, MD Follow up.   Specialty:  Cardiology Why:  please call office for appointment Contact information: [place] [address] [phone]        Richardson Dopp T, PA-C Follow up.   Specialties:  Cardiology, Physician Assistant Why:  Richardson Dopp, PA-C 9/5 @11 :15 am ([facility]) Contact information: [address] [place] [address] [phone]          The patient has been discharged on:   1.Beta Blocker:  Yes [ x  ]                              No   [   ]                              If No, reason:  2.Ace Inhibitor/ARB: Yes [ x  ]                                     No  [    ]                                     If No, reason:  3.Statin:   Yes [x   ]  No  [   ]                  If No, reason:  4.Ecasa:  Yes  [  x ]                  No   [   ]                  If No, reason:   Signed: Elgie Collard 12/12/2016, 10:02 AM

## 2016-12-10 NOTE — Discharge Instructions (Signed)

## 2016-12-10 NOTE — Progress Notes (Signed)
Patient ID: Grant Moran, male   DOB: [birth year], 61 y.o.   MRN: [id number] TCTS DAILY ICU PROGRESS NOTE                   [place]            [place] [address]          [phone]   3 Days Post-Op Procedure(s) (LRB): CORONARY ARTERY BYPASS GRAFTING (CABG) X 5  WITH ENDOSCOPIC HARVESTING OF RIGHT SAPHENOUS VEIN , [place] SEQ SVG-OM1-OM2 SEQ SVG-PD-PL (N/A) TRANSESOPHAGEAL ECHOCARDIOGRAM (TEE) (N/A)  Total Length of Stay:  LOS: 11 days   Subjective: Feels well this am, walked around unit several times yesterday   Objective: Vital signs in last 24 hours: Temp:  [98.1 F (36.7 C)-99.2 F (37.3 C)] 99 F (37.2 C) (08/17 0736) Pulse Rate:  [76-108] 96 (08/17 0736) Cardiac Rhythm: Sinus tachycardia (08/17 0400) Resp:  [15-22] 19 (08/17 0736) BP: (88-139)/(40-79) 123/67 (08/17 0736) SpO2:  [90 %-97 %] 96 % (08/17 0736) Arterial Line BP: (124-139)/(59-64) 139/64 (08/16 0830) Weight:  [164 lb 3.9 oz (74.5 kg)] 164 lb 3.9 oz (74.5 kg) (08/17 0500)  Filed Weights   12/08/16 0400 12/09/16 0404 12/10/16 0500  Weight: 172 lb 2.9 oz (78.1 kg) 168 lb 3.4 oz (76.3 kg) 164 lb 3.9 oz (74.5 kg)    Weight change: -3 lb 15.5 oz (-1.8 kg)   Hemodynamic parameters for last 24 hours:    Intake/Output from previous day: 08/16 0701 - 08/17 0700 In: 377.3 [I.V.:327.3; IV Piggyback:50] Out: 8676 [Urine:3250; Chest Tube:10]  Intake/Output this shift: Total I/O In: -  Out: 100 [Urine:100]  Current Meds: Scheduled Meds: . acetaminophen  1,000 mg Oral Q6H   Or  . acetaminophen (TYLENOL) oral liquid 160 mg/5 mL  1,000 mg Per Tube Q6H  . aspirin EC  325 mg Oral Daily   Or  . aspirin  324 mg Per Tube Daily  . atorvastatin  80 mg Oral Daily  . bisacodyl  10 mg Oral Daily   Or  . bisacodyl  10 mg Rectal Daily  . Chlorhexidine Gluconate Cloth  6 each Topical Daily  . clopidogrel  75 mg Oral Daily  . docusate sodium  200 mg Oral Daily  . enoxaparin (LOVENOX)  injection  30 mg Subcutaneous QHS  . insulin aspart  0-24 Units Subcutaneous Q4H  . insulin detemir  20 Units Subcutaneous Daily  . mouth rinse  15 mL Mouth Rinse BID  . metoprolol tartrate  12.5 mg Oral BID   Or  . metoprolol tartrate  12.5 mg Per Tube BID  . pantoprazole  40 mg Oral Daily  . sodium chloride flush  10-40 mL Intracatheter Q12H  . sodium chloride flush  3 mL Intravenous Q12H   Continuous Infusions: . sodium chloride Stopped (12/08/16 0700)  . lactated ringers    . lactated ringers 10 mL/hr at 12/10/16 0600  . lactated ringers    . magnesium sulfate    . nitroGLYCERIN Stopped (12/07/16 1400)  . phenylephrine (NEO-SYNEPHRINE) Adult infusion Stopped (12/07/16 1415)   PRN Meds:.lactated ringers, metoprolol tartrate, midazolam, morphine injection, ondansetron (ZOFRAN) IV, oxyCODONE, sodium chloride flush, sodium chloride flush, traMADol  General appearance: alert, cooperative and no distress Neurologic: intact Heart: regular rate and rhythm, S1, S2 normal, no murmur, click, rub or gallop Lungs: diminished breath sounds bibasilar Abdomen: soft, non-tender; bowel sounds normal; no masses,  no organomegaly Extremities: extremities normal, atraumatic, no cyanosis or  edema and Homans sign is negative, no sign of DVT Wound: sternum intact  Lab Results: CBC: Recent Labs  12/09/16 0309 12/10/16 0305  WBC 7.9 7.9  HGB 8.9* 8.7*  HCT 26.8* 26.4*  PLT 148* 165   BMET:  Recent Labs  12/09/16 0309 12/10/16 0305  NA 136 138  K 4.0 3.6  CL 105 104  CO2 23 27  GLUCOSE 112* 74  BUN 6 10  CREATININE 0.64 0.77  CALCIUM 8.4* 8.4*    CMET: Lab Results  Component Value Date   WBC 7.9 12/10/2016   HGB 8.7 (L) 12/10/2016   HCT 26.4 (L) 12/10/2016   PLT 165 12/10/2016   GLUCOSE 74 12/10/2016   CHOL 47 11/30/2016   TRIG 75 11/30/2016   HDL 21 (L) 11/30/2016   LDLCALC 11 11/30/2016   ALT 16 (L) 11/29/2016   AST 21 11/29/2016   NA 138 12/10/2016   K 3.6 12/10/2016    CL 104 12/10/2016   CREATININE 0.77 12/10/2016   BUN 10 12/10/2016   CO2 27 12/10/2016   TSH 3.158 11/29/2016   INR 1.36 12/07/2016   HGBA1C 11.4 (H) 12/06/2016      PT/INR:  Recent Labs  12/07/16 1421  LABPROT 16.9*  INR 1.36   Radiology: Dg Chest Port 1 View  Result Date: 12/10/2016 CLINICAL DATA:  Status post chest tube removal EXAM: PORTABLE CHEST 1 VIEW COMPARISON:  12/09/2016 FINDINGS: Left-sided chest tube has been removed. Right jugular sheath remains in place. No pneumothorax is noted. Mild bibasilar atelectasis is seen. Postsurgical changes are again noted. Cardiac shadow is stable. IMPRESSION: No pneumothorax following chest tube removal. Persistent bibasilar atelectasis is noted Electronically Signed   By: Inez Catalina M.D.   On: 12/10/2016 07:12     Assessment/Plan: S/P Procedure(s) (LRB): CORONARY ARTERY BYPASS GRAFTING (CABG) X 5  WITH ENDOSCOPIC HARVESTING OF RIGHT SAPHENOUS VEIN , [place] SEQ SVG-OM1-OM2 SEQ SVG-PD-PL (N/A) TRANSESOPHAGEAL ECHOCARDIOGRAM (TEE) (N/A) Mobilize Diuresis Diabetes control d/c tubes/lines Plan for transfer to step-down: see transfer orders Replace kcl    [name] 12/10/2016 7:46 AM

## 2016-12-11 ENCOUNTER — Inpatient Hospital Stay (HOSPITAL_COMMUNITY): Payer: BLUE CROSS/BLUE SHIELD

## 2016-12-11 LAB — BASIC METABOLIC PANEL
Anion gap: 12 (ref 5–15)
BUN: 9 mg/dL (ref 6–20)
CO2: 22 mmol/L (ref 22–32)
Calcium: 8.4 mg/dL — ABNORMAL LOW (ref 8.9–10.3)
Chloride: 99 mmol/L — ABNORMAL LOW (ref 101–111)
Creatinine, Ser: 0.72 mg/dL (ref 0.61–1.24)
GFR calc Af Amer: >60 mL/min (ref >60)
GFR calc non Af Amer: >60 mL/min (ref >60)
Glucose, Bld: 173 mg/dL — ABNORMAL HIGH (ref 65–99)
Potassium: 3.7 mmol/L (ref 3.5–5.1)
Sodium: 133 mmol/L — ABNORMAL LOW (ref 135–145)

## 2016-12-11 LAB — CBC
HCT: 30.4 % — ABNORMAL LOW (ref 39.0–52.0)
Hemoglobin: 10.4 g/dL — ABNORMAL LOW (ref 13.0–17.0)
MCH: 28.9 pg (ref 26.0–34.0)
MCHC: 34.2 g/dL (ref 30.0–36.0)
MCV: 84.4 fL (ref 78.0–100.0)
Platelets: 232 10*3/uL (ref 150–400)
RBC: 3.6 MIL/uL — ABNORMAL LOW (ref 4.22–5.81)
RDW: 13.6 % (ref 11.5–15.5)
WBC: 8.1 10*3/uL (ref 4.0–10.5)

## 2016-12-11 LAB — GLUCOSE, CAPILLARY
GLUCOSE-CAPILLARY: 199 mg/dL — AB (ref 65–99)
GLUCOSE-CAPILLARY: 203 mg/dL — AB (ref 65–99)
GLUCOSE-CAPILLARY: 173 mg/dL — AB (ref 65–99)
Glucose-Capillary: 167 mg/dL — ABNORMAL HIGH (ref 65–99)

## 2016-12-11 MED ORDER — INSULIN DETEMIR 100 UNIT/ML ~~LOC~~ SOLN
23.0000 [IU] | Freq: Every day | SUBCUTANEOUS | Status: DC
  Filled 2016-12-11: qty 0.23

## 2016-12-11 MED ORDER — LINAGLIPTIN 5 MG PO TABS
5.0000 mg | ORAL_TABLET | Freq: Every day | ORAL | Status: DC
  Administered 2016-12-11 – 2016-12-12 (×2): 5 mg via ORAL
  Filled 2016-12-11: qty 1

## 2016-12-11 MED ORDER — LOSARTAN POTASSIUM 50 MG PO TABS
50.0000 mg | ORAL_TABLET | Freq: Every day | ORAL | Status: DC
  Administered 2016-12-12: 50 mg via ORAL
  Filled 2016-12-11: qty 1

## 2016-12-11 MED ORDER — FENOFIBRATE 160 MG PO TABS
160.0000 mg | ORAL_TABLET | Freq: Every day | ORAL | Status: DC
  Administered 2016-12-11 – 2016-12-12 (×2): 160 mg via ORAL
  Filled 2016-12-11: qty 1

## 2016-12-11 NOTE — Progress Notes (Signed)
CARDIAC REHAB PHASE I   PRE:  Rate/Rhythm: 108 st  BP:  Sitting: 123/75      SaO2: 96 ra  MODE:  Ambulation: 600 ft   POST:  Rate/Rhythm: 125 st  BP:  Sitting: 129/65     SaO2: 98 ra  1000-1055 Patient ambulated in hallway independently. Steady gait noted. Patient denied complaints. Post ambulation patient to chair with call bell and phone in reach. Per CVTS surgeon, patient may go home tomorrow. All CR discharge education complete and handouts reviewed. Patient stated he was not able to read very well. CR RN read education to him and encouraged patient to have family read education prior to discharge and ask primary RN any questions they may have. Patient verbalized understanding. He will go home with his brother and sister-in-law. With patients permission phase 2 referral sent to [facility].  Symone Cornman English PayneRN, BSN 12/11/2016 10:55 AM

## 2016-12-11 NOTE — Progress Notes (Addendum)
      [address]       [place] [address]             [phone]      4 Days Post-Op Procedure(s) (LRB): CORONARY ARTERY BYPASS GRAFTING (CABG) X 5  WITH ENDOSCOPIC HARVESTING OF RIGHT SAPHENOUS VEIN , [place] SEQ SVG-OM1-OM2 SEQ SVG-PD-PL (N/A) TRANSESOPHAGEAL ECHOCARDIOGRAM (TEE) (N/A) Subjective: No issues this morning.   Objective: Vital signs in last 24 hours: Temp:  [98.5 F (36.9 C)-99.8 F (37.7 C)] 99.7 F (37.6 C) (08/18 0424) Pulse Rate:  [83-120] 88 (08/18 0700) Cardiac Rhythm: Sinus tachycardia (08/17 1900) Resp:  [18-35] 21 (08/18 0700) BP: (101-153)/(53-74) 111/67 (08/18 0424) SpO2:  [89 %-96 %] 94 % (08/18 0700) Weight:  [73.2 kg (161 lb 6.4 oz)] 73.2 kg (161 lb 6.4 oz) (08/18 0424)     Intake/Output from previous day: 08/17 0701 - 08/18 0700 In: 360 [P.O.:360] Out: 525 [Urine:525] Intake/Output this shift: No intake/output data recorded.  General appearance: alert, cooperative and no distress Heart: sinus tachycardia Lungs: clear to auscultation bilaterally Abdomen: soft, non-tender; bowel sounds normal; no masses,  no organomegaly Extremities: extremities normal, atraumatic, no cyanosis or edema Wound: clean and dry  Lab Results:  Recent Labs  12/10/16 0305 12/11/16 0319  WBC 7.9 8.1  HGB 8.7* 10.4*  HCT 26.4* 30.4*  PLT 165 232   BMET:  Recent Labs  12/10/16 0305 12/11/16 0319  NA 138 133*  K 3.6 3.7  CL 104 99*  CO2 27 22  GLUCOSE 74 173*  BUN 10 9  CREATININE 0.77 0.72  CALCIUM 8.4* 8.4*    PT/INR: No results for input(s): LABPROT, INR in the last 72 hours. ABG    Component Value Date/Time   PHART 7.394 12/07/2016 1842   HCO3 22.7 12/07/2016 1842   TCO2 24 12/08/2016 1628   ACIDBASEDEF 2.0 12/07/2016 1842   O2SAT 96.0 12/07/2016 1842   CBG (last 3)   Recent Labs  12/10/16 1515 12/10/16 2004 12/11/16 0601  GLUCAP 216* 169* 167*    Assessment/Plan: S/P Procedure(s) (LRB): CORONARY ARTERY  BYPASS GRAFTING (CABG) X 5  WITH ENDOSCOPIC HARVESTING OF RIGHT SAPHENOUS VEIN , [place]-LAD SEQ SVG-OM1-OM2 SEQ SVG-PD-PL (N/A) TRANSESOPHAGEAL ECHOCARDIOGRAM (TEE) (N/A)  1. CV-NSR in the 90s. BP is well controlled on Lisinopril and Lopressor. Continue statin and ASA.  2. Pulm-CXR shows persistent left lung base opacity most likely atelectasis, no pneumo, no pulm edema. Tolerating room air with good oxygen saturation 3. Renal-creatinine is 0.72, electrolytes okay. Making good urine and weight continued to trend down. Continue Lasix 40mg  daily.  4. Endo-blood glucose with moderate control. On SSI and Levemir. Will increase Levemir to 23 units daily.  5. H and H stable, platelets trending up.  6. Anticoagulation: on plavix and asa, Lovenox for DVT proph   Plan: remove EPW today. Ambulate in the halls. Work on appetite. Work on Science writer. Likely home in 1-2 days.    LOS: 12 days    Grant Moran 12/11/2016  ACE add to meds  Add back po diabetic meds and decrease /d/c insulin  Sinus tach , 100 lopressor dose increased last pm so has only gotten one at increased dose  Home 1-2 days  I have seen and examined Grant Moran and agree with the above assessment  and plan.  [name] MD Beeper [phone] Office [phone] 12/11/2016 10:12 AM

## 2016-12-12 LAB — GLUCOSE, CAPILLARY
GLUCOSE-CAPILLARY: 156 mg/dL — AB (ref 65–99)
Glucose-Capillary: 200 mg/dL — ABNORMAL HIGH (ref 65–99)

## 2016-12-12 MED ORDER — FUROSEMIDE 40 MG PO TABS: 40.0000 mg | ORAL_TABLET | Freq: Every day | ORAL | 0 refills | Status: DC

## 2016-12-12 MED ORDER — OXYCODONE HCL 5 MG PO TABS: 5.0000 mg | ORAL_TABLET | ORAL | 0 refills | Status: DC | PRN

## 2016-12-12 MED ORDER — METOPROLOL TARTRATE 25 MG PO TABS: 25.0000 mg | ORAL_TABLET | Freq: Two times a day (BID) | ORAL | 1 refills | Status: DC

## 2016-12-12 MED ORDER — ASPIRIN 81 MG PO TBEC: 81.0000 mg | DELAYED_RELEASE_TABLET | Freq: Every day | ORAL | 1 refills | Status: DC

## 2016-12-12 MED ORDER — POTASSIUM CHLORIDE CRYS ER 20 MEQ PO TBCR: 20.0000 meq | EXTENDED_RELEASE_TABLET | Freq: Every day | ORAL | 0 refills | Status: DC

## 2016-12-12 NOTE — Progress Notes (Signed)
Discharged to home with family office visits in place teaching done  

## 2016-12-12 NOTE — Progress Notes (Addendum)
      [address]       [place] [address]             [phone]      5 Days Post-Op Procedure(s) (LRB): CORONARY ARTERY BYPASS GRAFTING (CABG) X 5  WITH ENDOSCOPIC HARVESTING OF RIGHT SAPHENOUS VEIN , LIMA-LAD SEQ SVG-OM1-OM2 SEQ SVG-PD-PL (N/A) TRANSESOPHAGEAL ECHOCARDIOGRAM (TEE) (N/A) Subjective: Feels good this morning and asking to go home.  Objective: Vital signs in last 24 hours: Temp:  [98.6 F (37 C)-99.4 F (37.4 C)] 98.6 F (37 C) (08/19 0400) Pulse Rate:  [79-101] 79 (08/19 0400) Cardiac Rhythm: Sinus tachycardia (08/18 1905) Resp:  [19-28] 19 (08/19 0400) BP: (104-137)/(50-71) 104/51 (08/19 0400) SpO2:  [93 %-98 %] 96 % (08/19 0400) Weight:  [74.3 kg (163 lb 12.8 oz)] 74.3 kg (163 lb 12.8 oz) (08/19 0400)     Intake/Output from previous day: 08/18 0701 - 08/19 0700 In: 600 [P.O.:600] Out: -  Intake/Output this shift: No intake/output data recorded.  General appearance: alert, cooperative and no distress Heart: sinus tachycardia Lungs: clear to auscultation bilaterally Abdomen: soft, non-tender; bowel sounds normal; no masses,  no organomegaly Extremities: extremities normal, atraumatic, no cyanosis or edema Wound: clean and dry  Lab Results:  Recent Labs  12/10/16 0305 12/11/16 0319  WBC 7.9 8.1  HGB 8.7* 10.4*  HCT 26.4* 30.4*  PLT 165 232   BMET:  Recent Labs  12/10/16 0305 12/11/16 0319  NA 138 133*  K 3.6 3.7  CL 104 99*  CO2 27 22  GLUCOSE 74 173*  BUN 10 9  CREATININE 0.77 0.72  CALCIUM 8.4* 8.4*    PT/INR: No results for input(s): LABPROT, INR in the last 72 hours. ABG    Component Value Date/Time   PHART 7.394 12/07/2016 1842   HCO3 22.7 12/07/2016 1842   TCO2 24 12/08/2016 1628   ACIDBASEDEF 2.0 12/07/2016 1842   O2SAT 96.0 12/07/2016 1842   CBG (last 3)   Recent Labs  12/11/16 1618 12/11/16 2102 12/12/16 0607  GLUCAP 203* 173* 156*    Assessment/Plan: S/P Procedure(s) (LRB): CORONARY  ARTERY BYPASS GRAFTING (CABG) X 5  WITH ENDOSCOPIC HARVESTING OF RIGHT SAPHENOUS VEIN , LIMA-LAD SEQ SVG-OM1-OM2 SEQ SVG-PD-PL (N/A) TRANSESOPHAGEAL ECHOCARDIOGRAM (TEE) (N/A)  1. CV-NSR in the 25s. BP is normal-low on losartan and lopressor. Continue statin and ASA. Remove EPW today.  2. Pulm-CXR shows persistent left lung base opacity most likely atelectasis, no pneumo, no pulm edema. Tolerating room air with good oxygen saturation 3. Renal-creatinine is 0.72, electrolytes okay. Making good urine and weight continued to trend down. Continue Lasix 40mg  daily.  4. Endo-blood glucose with moderate control. Changed back to PO medication. Insulin if needed 5. H and H stable, platelets trending up.  6. Anticoagulation: on plavix and asa, Lovenox for DVT proph  Plan: Remove EPW this morning. Likely home later today.     LOS: 13 days    Elgie Collard 12/12/2016  Plan d/c today  I have seen and examined Grant Moran and agree with the above assessment  and plan.  [name] MD Bee[place] [phone] Office [phone] 12/12/2016 12:19 PM

## 2016-12-15 ENCOUNTER — Telehealth (HOSPITAL_COMMUNITY): Payer: Self-pay | Admitting: Physician Assistant

## 2016-12-15 NOTE — Telephone Encounter (Signed)
      [address]       [place] [address]             [phone]    Grant Moran [id number]   S/P CABG x 5  performed on 8/14.  Discharged home on 8/19.  Medications: Current Outpatient Prescriptions on File Prior to Visit  Medication Sig Dispense Refill  . aspirin 81 MG EC tablet Take 1 tablet (81 mg total) by mouth daily. 30 tablet 1  . atorvastatin (LIPITOR) 80 MG tablet Take 1 tablet by mouth daily.  3  . calcium carbonate (TUMS - DOSED IN MG ELEMENTAL CALCIUM) 500 MG chewable tablet Chew 2 tablets by mouth daily as needed for indigestion or heartburn.    . clopidogrel (PLAVIX) 75 MG tablet Take 1 tablet (75 mg total) by mouth daily. 90 tablet 3  . fenofibrate 160 MG tablet Take 160 mg by mouth daily.    . furosemide (LASIX) 40 MG tablet Take 1 tablet (40 mg total) by mouth daily. 5 tablet 0  . glyBURIDE-metformin (GLUCOVANCE) 5-500 MG tablet Take 1 tablet by mouth 2 (two) times daily.    . INVOKANA 300 MG TABS tablet Take 300 mg by mouth daily.  0  . losartan (COZAAR) 50 MG tablet Take 1 tablet (50 mg total) by mouth daily. 30 tablet 0  . metoprolol tartrate (LOPRESSOR) 25 MG tablet Take 1 tablet (25 mg total) by mouth 2 (two) times daily. 60 tablet 1  . oxyCODONE (OXY IR/ROXICODONE) 5 MG immediate release tablet Take 1 tablet (5 mg total) by mouth every 4 (four) hours as needed for severe pain. 30 tablet 0  . potassium chloride SA (K-DUR,KLOR-CON) 20 MEQ tablet Take 1 tablet (20 mEq total) by mouth daily. 5 tablet 0  . sitaGLIPtin (JANUVIA) 100 MG tablet Take by mouth.     No current facility-administered medications on file prior to visit.     Coumadin:  INR check Yes/No  Problems/Concerns: Grant Moran is doing well. He denies shortness of breath. He does have some incisional chest pain but has not taken a pain pill.  Assessment:  Patient is doing well.  He did not have any issues getting his medications filled. He is not taking his pain medication. He  feels good and is happy to be home. He knows about his appointment in a few weeks and plans to attend. Contact office if concerns or problems develop.   Follow up Appointment:   Follow-up Information    [name], MD Follow up.   Specialty:  Cardiothoracic Surgery Why:  Your appointment is on 01/13/2017 at 11:00am. PLease arrive at 10:30am for a chest xray located at [place] which is on the first floor of our building.  Contact information: [address] [place] [address] [phone]     Nicholes Rough, PA-C

## 2016-12-29 ENCOUNTER — Encounter: Payer: Self-pay | Admitting: Physician Assistant

## 2016-12-29 ENCOUNTER — Ambulatory Visit (INDEPENDENT_AMBULATORY_CARE_PROVIDER_SITE_OTHER): Payer: BLUE CROSS/BLUE SHIELD | Admitting: Physician Assistant

## 2016-12-29 VITALS — BP 130/70 | HR 88 | Ht 63.0 in | Wt 158.1 lb

## 2016-12-29 DIAGNOSIS — I214 Non-ST elevation (NSTEMI) myocardial infarction: Secondary | ICD-10-CM | POA: Diagnosis not present

## 2016-12-29 DIAGNOSIS — I5022 Chronic systolic (congestive) heart failure: Secondary | ICD-10-CM

## 2016-12-29 DIAGNOSIS — I2510 Atherosclerotic heart disease of native coronary artery without angina pectoris: Secondary | ICD-10-CM

## 2016-12-29 DIAGNOSIS — I10 Essential (primary) hypertension: Secondary | ICD-10-CM

## 2016-12-29 DIAGNOSIS — E7800 Pure hypercholesterolemia, unspecified: Secondary | ICD-10-CM | POA: Diagnosis not present

## 2016-12-29 MED ORDER — CARVEDILOL 6.25 MG PO TABS: 6.2500 mg | ORAL_TABLET | Freq: Two times a day (BID) | ORAL | 3 refills | Status: DC

## 2016-12-29 MED ORDER — ASPIRIN EC 81 MG PO TBEC: 81.0000 mg | DELAYED_RELEASE_TABLET | Freq: Every day | ORAL | Status: AC

## 2016-12-29 NOTE — Patient Instructions (Signed)
Medication Instructions:  Stop Metoprolol Start Carvedilol 6.25 mg twice daily Restart 1 baby Aspirin daily  Labwork: Have labs drawn 01/13/2017 Nothing to eat or drink after Midnight.  Lab opens 7:30 a.m, come to [facility] before appt with Dr Servando Snare.  Testing/Procedures: Limited Echocardiogram to be done after March 10, 2017  Follow-Up: Follow up with Richardson Dopp PA in 1 month  Any Other Special Instructions Will Be Listed Below (If Applicable).     If you need a refill on your cardiac medications before your next appointment, please call your pharmacy.

## 2016-12-29 NOTE — Progress Notes (Signed)
Cardiology Office Note:    Date:  12/29/2016   ID:  JEDAIAH RATHBUN, DOB 23-Aug-1955, MRN [id number]  PCP:  System, Provider Not In  Cardiologist:  Dr. Liam Rogers   Neurologist: Dr. Rosalin Hawking  Referring MD: No ref. provider found   Chief Complaint  Patient presents with  . Hospitalization Follow-up    CABG x 5    History of Present Illness:    Grant Moran is a 61 y.o. male with a hx of CAD status post prior inferior MI in 2001 treated with BMS and recurrent stenosis tx with brachytherapy, prior CVA, HTN, HL, systolic HF.  He was previously followed by Dr. Minus Breeding and also saw Dr. Lauree Chandler in the past.  He is now followed by Dr. Liam Rogers and was last seen in 1/17.    He was admitted 8/6-8/19 with a non-STEMI. Cardiac catheterization demonstrated 3 vessel CAD and he was referred for CABG. Of note echocardiogram demonstrated EF 30%. He underwent CABG with Dr. Servando Snare (LIMA-LAD, SVG-OM1/distal LCx, SVG-PDA/PLB). Postoperative course was uneventful. He required diuresis for volume excess.  [name] returns for posthospitalization follow-up.  He is here today with his sister. He has been doing well since DC. He has been staying with his brother since leaving the hospital. His chest soreness is improving. He denies significant dyspnea. He is trying to walk some. He denies PND or edema. He denies syncope. His appetite is good. He denies fevers.  Prior CV studies:   The following studies were reviewed today:  Intraoperative TEE 12/07/16 EF 30-35, normal wall motion, trace MR  Carotid US 12/04/16 Bilateral ICA 1-39  ABIs 12/04/16 - Right ABI of 0.93 is suggestive of mild arterial occlusive disease at rest. - Left ABI could not be accurately obtained due to non-compressible arteries likely due to medial calcification.  Cardiac Catheterization 12/03/16 LAD ostial LAD, mid 70 LCx ostial 50, proximal 100 RCA ostial 95, proximal 90, mid stent patent with 30 ISR, distal  stent patent EF 25-35 Referred for CABG  Echocardiogram 11/30/16 Diffuse HK worse in the inferior wall, moderate LVH, EF 30, normal diastolic function, mild MR, moderate LAE, PASP 40  Event monitor 1/17 NSR  Past Medical History:  Diagnosis Date  . CAD (coronary artery disease) 11/16/2011   S/p inferior MI in 2001 treated with BMS to the RCA  //  restenosis treated with brachytherapy in 2001 // non-STEMI 8/18 >> LHC with 3 vessel CAD >> s/p CABG (LIMA-LAD, SVG-OM1/distal LCx, SVG-PDA/PLB) - Grant Moran  . Carotid stenosis    Preoperative carotid US 8/18: Bilateral ICA 1-39  . Chronic systolic CHF (congestive heart failure) ([place]) 11/29/2016   Ischemic CM // Echo 8/18: Diffuse HK worse in the inferior wall, moderate LVH, EF 30, normal diastolic function, mild MR, moderate LAE, PASP 40 // Intra op TEE 8/18: EF 30-35, normal wall motion, trace MR  . Diabetes mellitus ([facility])   . Hyperlipidemia   . Hypertension   . Myocardial infarction ([place])    OCT 2001-He under went angioplasty  and  stenting  of the RCA and distal RCA   . PAD (peripheral artery disease) ([facility])    Preoperative ABIs 8/18: R 0.93; L inaccurate due to calcification/noncompressible  . Stroke [facility])     Past Surgical History:  Procedure Laterality Date  . ANKLE SURGERY     Trauma  . CORONARY ARTERY BYPASS GRAFT N/A 12/07/2016   Procedure: CORONARY ARTERY BYPASS GRAFTING (CABG) X 5  WITH ENDOSCOPIC  HARVESTING OF RIGHT SAPHENOUS VEIN , LIMA-LAD SEQ SVG-OM1-OM2 SEQ SVG-PD-PL;  Surgeon: [name], MD;  Location: [facility];  Service: Open Heart Surgery;  Laterality: N/A;  . fx left arm    . RIGHT/LEFT HEART CATH AND CORONARY ANGIOGRAPHY N/A 12/03/2016   Procedure: RIGHT/LEFT HEART CATH AND CORONARY ANGIOGRAPHY;  Surgeon: Martinique, Peter M, MD;  Location: [facility];  Service: Cardiovascular;  Laterality: N/A;  . stent sx    . TEE WITHOUT CARDIOVERSION N/A 05/07/2015   Procedure: TRANSESOPHAGEAL ECHOCARDIOGRAM (TEE);  Surgeon:  Thayer Headings, MD;  Location: [facility];  Service: Cardiovascular;  Laterality: N/A;  . TEE WITHOUT CARDIOVERSION N/A 12/07/2016   Procedure: TRANSESOPHAGEAL ECHOCARDIOGRAM (TEE);  Surgeon: [name], MD;  Location: [facility];  Service: Open Heart Surgery;  Laterality: N/A;    Current Medications: No outpatient prescriptions have been marked as taking for the 12/29/16 encounter (Office Visit) with Richardson Dopp T, PA-C.     Allergies:   Patient has no known allergies.   Social History   Social History  . Marital status: Single    Spouse name: N/A  . Number of children: 0  . Years of education: N/A   Occupational History  . 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 Smoking status: Former Smoker    Packs/day: 2.00    Years: 20.00    Types: Cigarettes    Quit date: 06/18/1990  . Smokeless tobacco: Never Used  . Alcohol use 0.6 oz/week    1 Shots of liquor per week     Comment: occasioinally  . Drug use: No  . Sexual activity: Not Currently   Other Topics Concern  . Not on file   Social History Narrative   Notable for remote tobacco abuse, with a prior three-pack per - day - history  For 20 years . He  Has not smoked since 1992.     Family Hx: The patient's family history includes Cancer in his mother; Heart attack in his father, maternal grandfather, paternal grandfather, and sister.  ROS:   Please see the history of present illness.    ROS All other systems reviewed and are negative.   EKGs/Labs/Other Test Reviewed:    EKG:  EKG is  ordered today.  The ekg ordered today demonstrates NSR, HR 84, normal axis, T-wave inversions 1, aVL, QTc 451 ms  Recent Labs: 11/29/2016: ALT 16; TSH 3.158 11/30/2016: B Natriuretic Peptide 389.0 12/08/2016: Magnesium 2.4 12/11/2016: BUN 9; Creatinine, Ser 0.72; Hemoglobin 10.4; Platelets 232; Potassium 3.7; Sodium 133   Recent Lipid Panel Lab Results  Component Value Date/Time   CHOL 47 11/30/2016 05:03 AM   TRIG  75 11/30/2016 05:03 AM   HDL 21 (L) 11/30/2016 05:03 AM   CHOLHDL 2.2 11/30/2016 05:03 AM   LDLCALC 11 11/30/2016 05:03 AM    Physical Exam:    VS:  BP 130/70 (BP Location: Right Arm)   Pulse 88   Ht 5\' 3"  (1.6 m)   Wt 158 lb 1.9 oz (71.7 kg)   BMI 28.01 kg/m     Wt Readings from Last 3 Encounters:  12/29/16 158 lb 1.9 oz (71.7 kg)  12/12/16 163 lb 12.8 oz (74.3 kg)  08/01/15 176 lb (79.8 kg)     Physical Exam  Constitutional: He is oriented to person, place, and time. He appears well-developed and well-nourished. No distress.  HENT:  Head: Normocephalic and atraumatic.  Eyes: No scleral icterus.  Neck: Normal  range of motion. Neck supple.  Cardiovascular: Normal rate, regular rhythm, S1 normal and S2 normal.   No murmur heard. Pulmonary/Chest: Effort normal and breath sounds normal. He has no rhonchi. He has no rales.  Median sternotomy well healed without erythema or d/c  Abdominal: Soft. There is no hepatomegaly. There is no tenderness.  Musculoskeletal: He exhibits no edema.  Neurological: He is alert and oriented to person, place, and time.  Skin: Skin is warm and dry.  Psychiatric: He has a normal mood and affect.    ASSESSMENT:    1. NSTEMI (non-ST elevated myocardial infarction) ([place])   2. Coronary artery disease involving native coronary artery of native heart without angina pectoris   3. Chronic systolic CHF (congestive heart failure) ([place])   4. Essential hypertension   5. Pure hypercholesterolemia    PLAN:    In order of problems listed above:  1. NSTEMI (non-ST elevated myocardial infarction) [facility]) Recent presentation with non-STEMI. LHC demonstrated 3 vessel CAD and he underwent CABG. Continue aspirin, Plavix, atorvastatin, beta blocker, ARB. He will need dual antiplatelet therapy for 1 year.  2. Coronary artery disease involving native coronary artery of native heart without angina pectoris He is recovering well since his bypass surgery. I have  encouraged him to enroll in cardiac rehabilitation. Continue aspirin, Plavix, statin, beta blocker, ARB. He will follow-up with Dr. Servando Snare later this month.  3. Chronic systolic CHF (congestive heart failure) ([facility]) Volume appears stable. His systolic heart failure is secondary to ischemic cardiomyopathy. Continue ARB. I will change his metoprolol to carvedilol 6.25 mg twice a day. Follow-up in one month. If blood pressure will allow, consider adding spironolactone. Arrange follow-up echo 90 days post MI.  4. Essential hypertension Blood pressure controlled. Medications will be adjusted for heart failure.  5. Pure hypercholesterolemia   Continue statin. Arrange follow-up lipids and LFTs   Dispo:  Return in about 4 weeks (around 01/26/2017) for Close Follow Up, w/ Richardson Dopp, PA-C.   Medication Adjustments/Labs and Tests Ordered: Current medicines are reviewed at length with the patient today.  Concerns regarding medicines are outlined above.  Tests Ordered: Orders Placed This Encounter  Procedures  . Lipid Profile  . Hepatic function panel  . EKG 12-Lead  . ECHOCARDIOGRAM LIMITED   Medication Changes: Meds ordered this encounter  Medications  . DISCONTD: carvedilol (COREG) 6.25 MG tablet    Sig: Take 1 tablet (6.25 mg total) by mouth 2 (two) times daily.    Dispense:  180 tablet    Refill:  3  . aspirin EC 81 MG tablet    Sig: Take 1 tablet (81 mg total) by mouth daily.  . carvedilol (COREG) 6.25 MG tablet    Sig: Take 1 tablet (6.25 mg total) by mouth 2 (two) times daily.    Dispense:  180 tablet    Refill:  3    Signed, Richardson Dopp, PA-C  12/29/2016 5:33 PM    [facility] [address], [place], [place]  [address] Phone: [phone]; Fax: [phone]

## 2016-12-30 ENCOUNTER — Telehealth: Payer: Self-pay | Admitting: Physician Assistant

## 2016-12-30 ENCOUNTER — Other Ambulatory Visit: Payer: Self-pay

## 2016-12-30 MED ORDER — CARVEDILOL 6.25 MG PO TABS: 6.2500 mg | ORAL_TABLET | Freq: Two times a day (BID) | ORAL | 3 refills | Status: DC

## 2016-12-30 NOTE — Telephone Encounter (Signed)
New message ° ° ° ° °

## 2017-01-13 ENCOUNTER — Telehealth: Payer: Self-pay | Admitting: *Deleted

## 2017-01-13 ENCOUNTER — Ambulatory Visit: Payer: BLUE CROSS/BLUE SHIELD | Admitting: Cardiothoracic Surgery

## 2017-01-13 ENCOUNTER — Other Ambulatory Visit: Payer: BLUE CROSS/BLUE SHIELD | Admitting: *Deleted

## 2017-01-13 DIAGNOSIS — E7800 Pure hypercholesterolemia, unspecified: Secondary | ICD-10-CM

## 2017-01-13 LAB — HEPATIC FUNCTION PANEL
ALBUMIN: 4.3 g/dL (ref 3.6–4.8)
ALT: 9 IU/L (ref 0–44)
AST: 14 IU/L (ref 0–40)
Alkaline Phosphatase: 86 IU/L (ref 39–117)
Bilirubin Total: 0.3 mg/dL (ref 0.0–1.2)
Bilirubin, Direct: 0.1 mg/dL (ref 0.00–0.40)
Total Protein: 7.1 g/dL (ref 6.0–8.5)

## 2017-01-13 LAB — LIPID PANEL
CHOLESTEROL TOTAL: 128 mg/dL (ref 100–199)
Chol/HDL Ratio: 4.6 ratio (ref 0.0–5.0)
HDL: 28 mg/dL — AB (ref >39)
LDL CALC: 61 mg/dL (ref 0–99)
TRIGLYCERIDES: 194 mg/dL — AB (ref 0–149)
VLDL CHOLESTEROL CAL: 39 mg/dL (ref 5–40)

## 2017-01-13 NOTE — Telephone Encounter (Signed)
Pt has been notified of lab results by phone with verbal understanding. Pt advised to limit Simple Carbs and change over to more Complex Carbs (whole grains). Pt agreeable to plan of care. Pt thanked me for my call.

## 2017-01-13 NOTE — Telephone Encounter (Signed)
-----   Message from Liliane Shi, [place] sent at 01/13/2017  4:51 PM EDT ----- Please call the patient LDL at goal. LFTs normal. Triglycerides somewhat elevated. Continue to work on diet for triglycerides. Otherwise continue current medications and follow-up as planned. Richardson Dopp, PA-C    01/13/2017 4:51 PM

## 2017-01-14 ENCOUNTER — Other Ambulatory Visit: Payer: Self-pay | Admitting: Cardiothoracic Surgery

## 2017-01-14 DIAGNOSIS — Z951 Presence of aortocoronary bypass graft: Secondary | ICD-10-CM

## 2017-01-17 ENCOUNTER — Encounter: Payer: Self-pay | Admitting: Physician Assistant

## 2017-01-17 ENCOUNTER — Ambulatory Visit (INDEPENDENT_AMBULATORY_CARE_PROVIDER_SITE_OTHER): Payer: Self-pay | Admitting: Physician Assistant

## 2017-01-17 ENCOUNTER — Ambulatory Visit
Admission: RE | Admit: 2017-01-17 | Discharge: 2017-01-17 | Disposition: A | Discharge status: Home / Self Care | Payer: BLUE CROSS/BLUE SHIELD | Source: Ambulatory Visit | Attending: Cardiothoracic Surgery | Admitting: Cardiothoracic Surgery

## 2017-01-17 VITALS — BP 122/77 | HR 90 | Resp 16 | Ht 63.0 in | Wt 164.0 lb

## 2017-01-17 DIAGNOSIS — Z951 Presence of aortocoronary bypass graft: Secondary | ICD-10-CM

## 2017-01-17 DIAGNOSIS — I2510 Atherosclerotic heart disease of native coronary artery without angina pectoris: Secondary | ICD-10-CM

## 2017-01-17 NOTE — Progress Notes (Signed)
SURGICAL PROCEDURE:  Coronary artery bypass grafting x5 with the left internal mammary to the left anterior descending coronary artery, sequential reverse saphenous vein graft to the first obtuse marginal and distal circumflex, sequential reverse saphenous vein graft to the posterior descending and posterior lateral branches of the right coronary artery, with right thigh greater saphenous vein harvest by Dr. Servando Snare on 12/07/2016.  HPI:  Patient returns for routine postoperative follow-up having undergone a CABG x 5 (s/p NSTEMI) by Dr. Servando Snare on 12/07/2016. Since hospital discharge the patient reports he continues to get stronger. He denies shortness of breath or chest pain.   Current Outpatient Prescriptions  Medication Sig Dispense Refill  . aspirin EC 81 MG tablet Take 1 tablet (81 mg total) by mouth daily.    Marland Kitchen atorvastatin (LIPITOR) 80 MG tablet Take 1 tablet by mouth daily.  3  . carvedilol (COREG) 6.25 MG tablet Take 1 tablet (6.25 mg total) by mouth 2 (two) times daily. 180 tablet 3  . clopidogrel (PLAVIX) 75 MG tablet Take 1 tablet (75 mg total) by mouth daily. 90 tablet 3  . furosemide (LASIX) 40 MG tablet Take 1 tablet (40 mg total) by mouth daily. 5 tablet 0  . glyBURIDE-metformin (GLUCOVANCE) 5-500 MG tablet Take 1 tablet by mouth 2 (two) times daily.    . INVOKANA 300 MG TABS tablet Take 300 mg by mouth daily.  0  . losartan (COZAAR) 50 MG tablet Take 1 tablet (50 mg total) by mouth daily. 30 tablet 0  . sitaGLIPtin (JANUVIA) 100 MG tablet Take by mouth.    Vital Signs: BP 122/77, HR 90, RR 16, Oxygen saturation 97% on room air.   Physical Exam: CV- RRR Pulmonary- Clear to auscultation bilaterally Abdomen- Soft, protuberant, non tender, bowel sounds present  Extremities- Trace bilateral lower extremity edema Wounds-Clean and dry. Eschar at RLE wound. This was removed and there was a small piece of suture also removed. No sign of infection.  Diagnostic Tests: CLINICAL  DATA:  History of CABG  EXAM: CHEST  2 VIEW  COMPARISON:  12/11/2016  FINDINGS: Low volume film. Retrocardiac atelectasis/ effusion seen on the prior study has resolved. Cardiopericardial silhouette is at upper limits of normal for size. Interstitial markings are diffusely coarsened with chronic features. The visualized bony structures of the thorax are intact.  IMPRESSION: No active cardiopulmonary disease.   Electronically Signed   By: Misty Stanley M.D.   On: 01/17/2017 13:03  Impression and Plan: Overall, Grant Moran is recovering well from coronary artery bypass grafting surgery. He has already seen Grant Dopp PA-C in cardiology in follow up. A 2 D echo has been ordered to re evaluate LVEF as he had a NSTEMI prior to heart surgery. He is on  Lasix for chronic systolic heart failure. He has not been on a potassium supplement. I will defer to his next appointment with cardiology to check his potassium. He is not taking any narcotics for pain. He was instructed he may begin driving short distances (i.e. 30 minutes or less during the day for the next week). He may then increase his frequency and duration as tolerates. He was instructed to continue with sternal precautions (i.e. No lifting more than 10 pounds for the next 2 weeks). He was encouraged to participate in cardiac rehab, which he is agreeable. I discussed with him the importance of good diabetes control and eating properly. I told him to obtain a medical doctor to help follow his HGA1C. He will return in  4-6 weeks to see Dr. Servando Snare. 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     Nani Skillern, PA-C [facility] [phone]

## 2017-01-17 NOTE — Patient Instructions (Signed)
Make every effort to keep your diabetes under very tight control.  Follow up closely with your primary care physician or endocrinologist and strive to keep their hemoglobin A1c levels as low as possible, preferably near or below 6.0.  The long term benefits of strict control of diabetes are far reaching and critically important for your overall health and survival.  You may return to driving an automobile as long as you are no longer requiring oral narcotic pain relievers during the daytime.  It would be wise to start driving only short distances during the daylight and gradually increase from there as you feel comfortable.  Continue to avoid any heavy lifting or strenuous use of your arms or shoulders for at least a total of three months from the time of surgery.  After three months you may gradually increase how much you lift or otherwise use your arms or chest as tolerated, with limits based upon whether or not activities lead to the return of significant discomfort.  You are encouraged to enroll and participate in the outpatient cardiac rehab program beginning as soon as practical.

## 2017-01-28 ENCOUNTER — Encounter: Payer: Self-pay | Admitting: Physician Assistant

## 2017-01-28 ENCOUNTER — Ambulatory Visit (INDEPENDENT_AMBULATORY_CARE_PROVIDER_SITE_OTHER): Payer: BLUE CROSS/BLUE SHIELD | Admitting: Physician Assistant

## 2017-01-28 VITALS — BP 132/60 | HR 85 | Ht 63.0 in | Wt 165.4 lb

## 2017-01-28 DIAGNOSIS — E7800 Pure hypercholesterolemia, unspecified: Secondary | ICD-10-CM | POA: Diagnosis not present

## 2017-01-28 DIAGNOSIS — I2510 Atherosclerotic heart disease of native coronary artery without angina pectoris: Secondary | ICD-10-CM

## 2017-01-28 DIAGNOSIS — I10 Essential (primary) hypertension: Secondary | ICD-10-CM

## 2017-01-28 DIAGNOSIS — I5022 Chronic systolic (congestive) heart failure: Secondary | ICD-10-CM | POA: Diagnosis not present

## 2017-01-28 MED ORDER — SPIRONOLACTONE 25 MG PO TABS: 12.5000 mg | ORAL_TABLET | Freq: Every day | ORAL | 3 refills | Status: AC

## 2017-01-28 NOTE — Progress Notes (Signed)
Cardiology Office Note:    Date:  01/28/2017   ID:  Grant Moran, DOB Aug 30, 1955, MRN [id number]  PCP:  System, Provider Not In  Cardiologist:  Dr. Liam Rogers    Referring MD: No ref. provider found   Chief Complaint  Patient presents with  . Congestive Heart Failure    follow up    History of Present Illness:    Grant Moran is a 61 y.o. male with a hx of CAD status post prior inferior MI in 2001 treated with BMS and recurrent stenosis tx with brachytherapy, prior CVA, HTN, HL, systolic HF.  He was previously followed by Dr. Minus Moran and also saw Dr. Lauree Moran in the past.  He is now followed by Dr. Liam Rogers.  He was admitted 8/18 with a non-STEMI. Echocardiogram demonstrated EF 30%. Cardiac catheterization demonstrated 3 vessel CAD and he underwent CABG with Dr. Servando Snare (LIMA-LAD, SVG-OM1/distal LCx, SVG-PDA/PLB).  Last seen 12/29/16.    [name] returns for follow up.  He is walking almost every day at [facility].  He feels tired.  This is improving slowly.  His chest soreness is improved.  He has not passed out.  He denies paroxysmal nocturnal dyspnea, edema.  His R leg wound is still painful.  A scab came off at the wound near his knee recently.  He denies fevers.   Prior CV studies:   The following studies were reviewed today:  Intraoperative TEE 12/07/16 EF 30-35, normal wall motion, trace MR  Carotid US 12/04/16 Bilateral ICA 1-39  ABIs 12/04/16 - Right ABI of 0.93 is suggestive of mild arterial occlusive disease at rest. - Left ABI could not be accurately obtained due to non-compressible arteries likely due to medial calcification.  Cardiac Catheterization 12/03/16 LAD ostial LAD, mid 70 LCx ostial 50, proximal 100 RCA ostial 95, proximal 90, mid stent patent with 30 ISR, distal stent patent EF 25-35 Referred for CABG  Echocardiogram 11/30/16 Diffuse HK worse in the inferior wall, moderate LVH, EF 30, normal diastolic function, mild MR, moderate  LAE, PASP 40  Event monitor 1/17 NSR  Past Medical History:  Diagnosis Date  . CAD (coronary artery disease) 11/16/2011   S/p inferior MI in 2001 treated with BMS to the RCA  //  restenosis treated with brachytherapy in 2001 // non-STEMI 8/18 >> LHC with 3 vessel CAD >> s/p CABG (LIMA-LAD, SVG-OM1/distal LCx, SVG-PDA/PLB) - Grant Moran  . Carotid stenosis    Preoperative carotid US 8/18: Bilateral ICA 1-39  . Chronic systolic CHF (congestive heart failure) ([place]) 11/29/2016   Ischemic CM // Echo 8/18: Diffuse HK worse in the inferior wall, moderate LVH, EF 30, normal diastolic function, mild MR, moderate LAE, PASP 40 // Intra op TEE 8/18: EF 30-35, normal wall motion, trace MR  . Diabetes mellitus ([place])   . Hyperlipidemia   . Hypertension   . Myocardial infarction ([place])    OCT 2001-He under went angioplasty  and  stenting  of the RCA and distal RCA   . PAD (peripheral artery disease) (HCC)    Preoperative ABIs 8/18: R 0.93; L inaccurate due to calcification/noncompressible  . Stroke [facility])     Past Surgical History:  Procedure Laterality Date  . ANKLE SURGERY     Trauma  . CORONARY ARTERY BYPASS GRAFT N/A 12/07/2016   Procedure: CORONARY ARTERY BYPASS GRAFTING (CABG) X 5  WITH ENDOSCOPIC HARVESTING OF RIGHT SAPHENOUS VEIN , LIMA-LAD SEQ SVG-OM1-OM2 SEQ SVG-PD-PL;  Surgeon: [name], MD;  Location: [facility] OR;  Service: Open Heart Surgery;  Laterality: N/A;  . fx left arm    . RIGHT/LEFT HEART CATH AND CORONARY ANGIOGRAPHY N/A 12/03/2016   Procedure: RIGHT/LEFT HEART CATH AND CORONARY ANGIOGRAPHY;  Surgeon: Martinique, Peter M, MD;  Location: [facility];  Service: Cardiovascular;  Laterality: N/A;  . stent sx    . TEE WITHOUT CARDIOVERSION N/A 05/07/2015   Procedure: TRANSESOPHAGEAL ECHOCARDIOGRAM (TEE);  Surgeon: Thayer Headings, MD;  Location: [facility];  Service: Cardiovascular;  Laterality: N/A;  . TEE WITHOUT CARDIOVERSION N/A 12/07/2016   Procedure: TRANSESOPHAGEAL  ECHOCARDIOGRAM (TEE);  Surgeon: [name], MD;  Location: [facility];  Service: Open Heart Surgery;  Laterality: N/A;    Current Medications: Current Meds  Medication Sig  . aspirin EC 81 MG tablet Take 1 tablet (81 mg total) by mouth daily.  Marland Kitchen atorvastatin (LIPITOR) 80 MG tablet Take 1 tablet by mouth daily.  . carvedilol (COREG) 6.25 MG tablet Take 1 tablet (6.25 mg total) by mouth 2 (two) times daily.  . clopidogrel (PLAVIX) 75 MG tablet Take 1 tablet (75 mg total) by mouth daily.  . furosemide (LASIX) 40 MG tablet Take 1 tablet (40 mg total) by mouth daily.  Marland Kitchen glyBURIDE-metformin (GLUCOVANCE) 5-500 MG tablet Take 1 tablet by mouth 2 (two) times daily.  . INVOKANA 300 MG TABS tablet Take 300 mg by mouth daily.  Marland Kitchen losartan (COZAAR) 50 MG tablet Take 1 tablet (50 mg total) by mouth daily.  . sitaGLIPtin (JANUVIA) 100 MG tablet Take by mouth.     Allergies:   Patient has no known allergies.   Social History   Social History  . Marital status: Single    Spouse name: N/A  . Number of children: 0  . Years of education: N/A   Occupational History  . [employer]ile [employer] [place] History Main Topics  . Smoking status: Former Smoker    Packs/day: 2.00    Years: 20.00    Types: Cigarettes    Quit date: 06/18/1990  . Smokeless tobacco: Never Used  . Alcohol use 0.6 oz/week    1 Shots of liquor per week     Comment: occasioinally  . Drug use: No  . Sexual activity: Not Currently   Other Topics Concern  . None   Social History Narrative   Notable for remote tobacco abuse, with a prior three-pack per - day - history  For 20 years . He  Has not smoked since 1992.     Family Hx: The patient's family history includes Cancer in his mother; Heart attack in his father, maternal grandfather, paternal grandfather, and sister.  ROS:   Please see the history of present illness.    ROS All other systems reviewed and are negative.   EKGs/Labs/Other Test Reviewed:    EKG:  EKG  is  ordered today.  The ekg ordered today demonstrates NSR, HR 85, normal axis, TWI 1, aVL, septal Q waves, QTc 456 ms  Recent Labs: 11/29/2016: TSH 3.158 11/30/2016: B Natriuretic Peptide 389.0 12/08/2016: Magnesium 2.4 12/11/2016: BUN 9; Creatinine, Ser 0.72; Hemoglobin 10.4; Platelets 232; Potassium 3.7; Sodium 133 01/13/2017: ALT 9   Recent Lipid Panel Lab Results  Component Value Date/Time   CHOL 128 01/13/2017 10:01 AM   TRIG 194 (H) 01/13/2017 10:01 AM   HDL 28 (L) 01/13/2017 10:01 AM   CHOLHDL 4.6 01/13/2017 10:01 AM   CHOLHDL 2.2 11/30/2016 05:03 AM   LDLCALC 61 01/13/2017 10:01  AM    Physical Exam:    VS:  BP 132/60   Pulse 85   Ht 5\' 3"  (1.6 m)   Wt 165 lb 6.4 oz (75 kg)   BMI 29.30 kg/m     Wt Readings from Last 3 Encounters:  01/28/17 165 lb 6.4 oz (75 kg)  01/17/17 164 lb (74.4 kg)  12/29/16 158 lb 1.9 oz (71.7 kg)     Physical Exam  Constitutional: He is oriented to person, place, and time. He appears well-developed and well-nourished. No distress.  HENT:  Head: Normocephalic and atraumatic.  Neck: No JVD present.  Cardiovascular: Normal rate and regular rhythm.   No murmur heard. Pulmonary/Chest: Effort normal. He has no rales.  Abdominal: Soft.  Musculoskeletal: He exhibits no edema.  R leg with SVG harvest site slow to heal; no drainage or significant erythema   Neurological: He is alert and oriented to person, place, and time.  Skin: Skin is warm and dry.  Psychiatric: He has a normal mood and affect.    ASSESSMENT:    1. Coronary artery disease involving native coronary artery of native heart without angina pectoris   2. Chronic systolic CHF (congestive heart failure) ([place])   3. Essential hypertension   4. Pure hypercholesterolemia    PLAN:    In order of problems listed above:  1. Coronary artery disease involving native coronary artery of native heart without angina pectoris Prior inferior MI in 2001 treated with bare metal stent and  recurrent stenosis treated with brachytherapy. He was admitted 8/18 with non-STEMI and went on to CABG. He continues to slowly progress. He has not had recurrent angina. Continue aspirin, Plavix, statin, beta blocker, ARB. We will see if the hospital close to his home ([facility]) has cardiac rehabilitation. If so, we will refer him. His leg wound does not look infected. I have asked him to keep it clean and dry and he may use topical antibiotic as needed.  2. Chronic systolic CHF (congestive heart failure) (HCC)  Volume appears stable. He is New York Heart Association class II-IIb. Continue current dose of beta blocker, ARB. I will start him on spironolactone 12.5 mg daily. Obtain BMET today. Repeat BMET every week 2. Plan follow-up echocardiogram in November, 90 days out from bypass, to reassess LV function.   3. Essential hypertension  Blood pressure controlled on current regimen.  4. Pure hypercholesterolemia LDL optimal on most recent lab work.  Continue current Rx.    Dispo:  Return in about 3 months (around 04/30/2017) for Routine Follow Up, w/ Dr. Acie Fredrickson.   Medication Adjustments/Labs and Tests Ordered: Current medicines are reviewed at length with the patient today.  Concerns regarding medicines are outlined above.  Tests Ordered: Orders Placed This Encounter  Procedures  . Basic metabolic panel  . EKG 12-Lead  . ECHOCARDIOGRAM COMPLETE   Medication Changes: Meds ordered this encounter  Medications  . spironolactone (ALDACTONE) 25 MG tablet    Sig: Take 0.5 tablets (12.5 mg total) by mouth daily.    Dispense:  45 tablet    Refill:  3    Signed, Richardson Dopp, PA-C  01/28/2017 1:36 PM    [facility] [place], [place],   [address] Phone: [phone]; Fax: [phone]

## 2017-01-28 NOTE — Patient Instructions (Signed)
Medication Instructions:  Please start Spironolactone 25 mg 1/2 tablet a day. Continue all other medications as listed.  Labwork: Please have blood work today (BMP) Repeat around 10/12 and again 10/19 with results faxed to [phone].  Testing/Procedures: Your physician has requested that you have an echocardiogram (after 11/15). Echocardiography is a painless test that uses sound waves to create images of your heart. It provides your doctor with information about the size and shape of your heart and how well your heart's chambers and valves are working. This procedure takes approximately one hour. There are no restrictions for this procedure.  You are being referred to Cardiac Rehab at [facility].  Follow-Up: Follow up in 3 months with Dr Acie Fredrickson.  If you need a refill on your cardiac medications before your next appointment, please call your pharmacy.  Thank you for choosing [facility]!!

## 2017-01-29 LAB — BASIC METABOLIC PANEL
BUN / CREAT RATIO: 18 (ref 10–24)
BUN: 12 mg/dL (ref 8–27)
CO2: 19 mmol/L — ABNORMAL LOW (ref 20–29)
CREATININE: 0.67 mg/dL — AB (ref 0.76–1.27)
Calcium: 9.7 mg/dL (ref 8.6–10.2)
Chloride: 97 mmol/L (ref 96–106)
GFR calc non Af Amer: 104 mL/min/{1.73_m2} (ref >59)
GFR, EST AFRICAN AMERICAN: 120 mL/min/{1.73_m2} (ref >59)
GLUCOSE: 366 mg/dL — AB (ref 65–99)
Potassium: 4.5 mmol/L (ref 3.5–5.2)
Sodium: 134 mmol/L (ref 134–144)

## 2017-01-31 ENCOUNTER — Telehealth: Payer: Self-pay | Admitting: *Deleted

## 2017-01-31 NOTE — Telephone Encounter (Signed)
-----   Message from Liliane Shi, [place] sent at 01/30/2017 12:56 PM EDT ----- Please call the patient Kidney function is normal. Glucose is elevated. He should follow up with his primary care doctor for diabetes. O/w, Continue with current treatment plan. Richardson Dopp, PA-C   01/30/2017 12:56 PM

## 2017-01-31 NOTE — Telephone Encounter (Signed)
Pt has been notified of lab results by phone with verbal understanding. Pt advised to f/u with PCP Dr.Sanders in [facility] to f/u on diabetes, glucose level 366. I will fax results to PCP as well. Pt thanked me for my call today.

## 2017-02-02 ENCOUNTER — Other Ambulatory Visit: Payer: Self-pay | Admitting: *Deleted

## 2017-02-02 DIAGNOSIS — I214 Non-ST elevation (NSTEMI) myocardial infarction: Secondary | ICD-10-CM

## 2017-02-02 DIAGNOSIS — I2510 Atherosclerotic heart disease of native coronary artery without angina pectoris: Secondary | ICD-10-CM

## 2017-02-02 DIAGNOSIS — I5022 Chronic systolic (congestive) heart failure: Secondary | ICD-10-CM

## 2017-02-24 ENCOUNTER — Ambulatory Visit: Payer: Self-pay | Admitting: Cardiothoracic Surgery

## 2017-03-03 ENCOUNTER — Ambulatory Visit (INDEPENDENT_AMBULATORY_CARE_PROVIDER_SITE_OTHER): Payer: Self-pay | Admitting: Cardiothoracic Surgery

## 2017-03-03 ENCOUNTER — Encounter: Payer: Self-pay | Admitting: Cardiothoracic Surgery

## 2017-03-03 ENCOUNTER — Other Ambulatory Visit: Payer: Self-pay

## 2017-03-03 VITALS — BP 122/83 | HR 111 | Ht 63.0 in | Wt 165.0 lb

## 2017-03-03 DIAGNOSIS — I2510 Atherosclerotic heart disease of native coronary artery without angina pectoris: Secondary | ICD-10-CM

## 2017-03-03 DIAGNOSIS — Z951 Presence of aortocoronary bypass graft: Secondary | ICD-10-CM

## 2017-03-03 NOTE — Progress Notes (Signed)
[address]       [place] [address]             [phone]      Grant Moran [facility] Medical Record #[id number] Date of Birth: [birth year]  Referring: Moran, Grant Cheng, MD Primary Care: Grant Leash, PA-C  Chief Complaint:   POST OP FOLLOW UP Coronary artery bypass grafting x5 with the left internal mammary to the left anterior descending coronary artery, sequential reverse saphenous vein graft to the first obtuse marginal and distal circumflex, sequential reverse saphenous vein graft to the posterior descending and posterior lateral branches of the right coronary artery, with right thigh greater saphenous vein harvest   Dr. Servando Moran on 12/07/2016.  LV function at the time of cardiac catheterization 25-35% Intraoperative 30%  History of Present Illness:      Patient originally presented with acute heart failure while at work, ultimately underwent coronary artery bypass grafting in August.  Since that time he is gradually improved.  On his current medications he denies any pedal edema, or significant shortness of breath.  He does have an appointment for an echocardiogram next week noted in the epic chart, though the patient was unaware of it.  Patient does note that he has some difficulty swallowing.  He is now on insulin in addition to p.o. diabetic medications.  He says his glucose still remains over 200 all the time.      Past Medical History:  Diagnosis Date  . CAD (coronary artery disease) 11/16/2011   S/p inferior MI in 2001 treated with BMS to the RCA  //  restenosis treated with brachytherapy in 2001 // non-STEMI 8/18 >> LHC with 3 vessel CAD >> s/p CABG (LIMA-LAD, SVG-OM1/distal LCx, SVG-PDA/PLB) - Grant Moran  . Carotid stenosis    Preoperative carotid US 8/18: Bilateral ICA 1-39  . Chronic systolic CHF (congestive heart failure) ([place]) 11/29/2016   Ischemic CM // Echo 8/18: Diffuse HK worse in the inferior wall, moderate LVH, EF 30, normal  diastolic function, mild MR, moderate LAE, PASP 40 // Intra op TEE 8/18: EF 30-35, normal wall motion, trace MR  . Diabetes mellitus ([place])   . Hyperlipidemia   . Hypertension   . Myocardial infarction ([place])    OCT 2001-He under went angioplasty  and  stenting  of the RCA and distal RCA   . PAD (peripheral artery disease) (HCC)    Preoperative ABIs 8/18: R 0.93; L inaccurate due to calcification/noncompressible  . Stroke [facility])      Social History   Tobacco Use  Smoking Status Former Smoker  . Packs/day: 2.00  . Years: 20.00  . Pack years: 40.00  . Types: Cigarettes  . Last attempt to quit: 06/18/1990  . Years since quitting: 26.7  Smokeless Tobacco Never Used    Social History   Substance and Sexual Activity  Alcohol Use Yes  . Alcohol/week: 0.6 oz  . Types: 1 Shots of liquor per week   Comment: occasioinally     No Known Allergies  Current Outpatient Medications  Medication Sig Dispense Refill  . aspirin EC 81 MG tablet Take 1 tablet (81 mg total) by mouth daily.    Marland Kitchen atorvastatin (LIPITOR) 80 MG tablet Take 1 tablet by mouth daily.  3  . carvedilol (COREG) 6.25 MG tablet Take 1 tablet (6.25 mg total) by mouth 2 (two) times daily. 180 tablet 3  . clopidogrel (PLAVIX) 75 MG tablet Take 1 tablet (75 mg  total) by mouth daily. 90 tablet 3  . furosemide (LASIX) 40 MG tablet Take 1 tablet (40 mg total) by mouth daily. 5 tablet 0  . glyBURIDE-metformin (GLUCOVANCE) 5-500 MG tablet Take 1 tablet by mouth 2 (two) times daily.    . INVOKANA 300 MG TABS tablet Take 300 mg by mouth daily.  0  . losartan (COZAAR) 50 MG tablet Take 1 tablet (50 mg total) by mouth daily. 30 tablet 0  . spironolactone (ALDACTONE) 25 MG tablet Take 0.5 tablets (12.5 mg total) by mouth daily. 45 tablet 3  . sitaGLIPtin (JANUVIA) 100 MG tablet Take by mouth.     No current facility-administered medications for this visit.        Physical Exam: BP 122/83   Pulse (!) 111   Ht 5\' 3"  (1.6 m)   Wt  165 lb (74.8 kg)   SpO2 98%   BMI 29.23 kg/m   General appearance: alert and cooperative Neurologic: intact Heart: regular rate and rhythm, S1, S2 normal, no murmur, click, rub or gallop Lungs: clear to auscultation bilaterally Abdomen: soft, non-tender; bowel sounds normal; no masses,  no organomegaly Extremities: extremities normal, atraumatic, no cyanosis or edema and Homans sign is negative, no sign of DVT Wound: Sternum is stable and well-healed, vein harvest sites also well-healed, patient has no pedal edema   Diagnostic Studies & Laboratory data:     Recent Radiology Findings:   No results found.    Recent Lab Findings: Lab Results  Component Value Date   WBC 8.1 12/11/2016   HGB 10.4 (L) 12/11/2016   HCT 30.4 (L) 12/11/2016   PLT 232 12/11/2016   GLUCOSE 366 (H) 01/28/2017   CHOL 128 01/13/2017   TRIG 194 (H) 01/13/2017   HDL 28 (L) 01/13/2017   LDLCALC 61 01/13/2017   ALT 9 01/13/2017   AST 14 01/13/2017   NA 134 01/28/2017   K 4.5 01/28/2017   CL 97 01/28/2017   CREATININE 0.67 (L) 01/28/2017   BUN 12 01/28/2017   CO2 19 (L) 01/28/2017   TSH 3.158 11/29/2016   INR 1.36 12/07/2016   HGBA1C 11.4 (H) 12/06/2016      Assessment / Plan:   Patient doing well following coronary artery bypass grafting, with current medications and revascularization he is staying out of heart failure.  He does have a follow-up echocardiogram for next week to evaluate LV function 3 months after surgery.  At that time decision about further evaluation for possible AICD can be discussed with the patient.   Patient will return to see his primary care doctor for further aggressive treatment of his diabetes  He would discuss with his primary care doctor for further evaluation of swallowing difficulties.   Grant Isaac MD      [address] [place] [address] Office [phone]   [facility] [phone]  03/03/2017 10:51 AM

## 2017-03-14 ENCOUNTER — Other Ambulatory Visit: Payer: Self-pay

## 2017-03-14 ENCOUNTER — Ambulatory Visit (HOSPITAL_COMMUNITY): Payer: BLUE CROSS/BLUE SHIELD | Attending: Cardiology

## 2017-03-14 DIAGNOSIS — I2510 Atherosclerotic heart disease of native coronary artery without angina pectoris: Secondary | ICD-10-CM | POA: Diagnosis not present

## 2017-03-14 DIAGNOSIS — I214 Non-ST elevation (NSTEMI) myocardial infarction: Secondary | ICD-10-CM | POA: Insufficient documentation

## 2017-03-14 DIAGNOSIS — I5022 Chronic systolic (congestive) heart failure: Secondary | ICD-10-CM | POA: Diagnosis not present

## 2017-03-15 ENCOUNTER — Telehealth: Payer: Self-pay | Admitting: Nurse Practitioner

## 2017-03-15 NOTE — Telephone Encounter (Signed)
-----   Message from Burtis Junes, NP sent at 03/15/2017 10:20 AM EST ----- Reviewed for Richardson Dopp, PA Reviewed by Dr. Calvert Cantor to report. Unfortunately his EF has not improved. Needs referral to EP for discussion of ICD implant.

## 2017-03-15 NOTE — Telephone Encounter (Signed)
Reviewed results and plan of care with patient. He verbalized agreement with the plan and is aware that someone from our office will call him to schedule an appointment with one of our EP doctors.  I sent a message to Lorenda Hatchet our scheduler for EP team

## 2017-03-16 ENCOUNTER — Telehealth: Payer: Self-pay | Admitting: Cardiovascular Disease

## 2017-03-16 NOTE — Telephone Encounter (Signed)
Release on Information mailed to pt home address.

## 2017-03-29 ENCOUNTER — Telehealth: Payer: Self-pay | Admitting: Cardiovascular Disease

## 2017-03-29 NOTE — Telephone Encounter (Signed)
Grant Moran is calling because his insurance company called and told him that we have released him to go back to work and he has no idea about this. Please call

## 2017-03-29 NOTE — Telephone Encounter (Signed)
Attempted to call patient, no voice mail set up so unable to leave a message

## 2017-03-29 NOTE — Telephone Encounter (Signed)
Patient called back and said that his disability insurance had contacted him that we have released him to go back to work. I advised that our office has not received any paperwork from his employer or insurance company but that I can see in his chart that Dr. Everrett Coombe office (cardiac surgeon) has released him to return to work. I advised that he will need to have paperwork faxed to [place] because we are the cardiology office which is different from the surgeon's office. I asked if he would like our fax number and he said no. He stated that I need to call [employer] and I advised that I would not be the one calling, that he needs to call and direct them to fax paperwork to [place] and again offered him our fax number. He said he did not need it and thanked me for the call.

## 2017-04-06 ENCOUNTER — Institutional Professional Consult (permissible substitution): Payer: BLUE CROSS/BLUE SHIELD | Admitting: Internal Medicine

## 2017-04-07 ENCOUNTER — Telehealth: Payer: Self-pay

## 2017-04-07 ENCOUNTER — Telehealth: Payer: Self-pay | Admitting: Internal Medicine

## 2017-04-07 NOTE — Telephone Encounter (Signed)
Spoke with patient and advised him to call Dr. Everrett Coombe office at [phone] regarding the time that he was told to be out of work. The patient was scheduled to see Dr. Caryl Comes yesterday to discuss ICD but his appointment was cancelled due to snow. He verbalized understanding of the information and thanked me for the call.

## 2017-04-07 NOTE — Telephone Encounter (Signed)
Grant Moran was cleared, from a surgical stand point to return to work on 03/03/2017. ( see Office Note). If he needs continued disability, the form should be completed by the treating MD.

## 2017-04-07 NOTE — Telephone Encounter (Signed)
Left message for Grant Moran at [facility] to please call back to give [place] more information about this patient's request

## 2017-04-07 NOTE — Telephone Encounter (Signed)
New message  Pt verbalized that she is calling for the rn    he said that his insurance needs a lettter stating why he can not work    He said he has not received a check

## 2017-04-07 NOTE — Telephone Encounter (Signed)
F/U call:  Patient calling, states that he called Dr. Everrett Coombe office and was told that his office had released him and sent him back to [place]. Patient is requesting that we call [employer] at [phone], so that he can get check.

## 2017-04-27 ENCOUNTER — Encounter: Payer: Self-pay | Admitting: Internal Medicine

## 2017-04-27 ENCOUNTER — Ambulatory Visit: Payer: BLUE CROSS/BLUE SHIELD | Admitting: Internal Medicine

## 2017-04-27 VITALS — BP 122/84 | HR 111 | Ht 63.0 in | Wt 173.6 lb

## 2017-04-27 DIAGNOSIS — I2510 Atherosclerotic heart disease of native coronary artery without angina pectoris: Secondary | ICD-10-CM

## 2017-04-27 DIAGNOSIS — I5022 Chronic systolic (congestive) heart failure: Secondary | ICD-10-CM

## 2017-04-27 LAB — CBC WITH DIFFERENTIAL/PLATELET
BASOS ABS: 0 10*3/uL (ref 0.0–0.2)
Basos: 0 %
EOS (ABSOLUTE): 0.3 10*3/uL (ref 0.0–0.4)
EOS: 4 %
HEMOGLOBIN: 13.6 g/dL (ref 13.0–17.7)
Hematocrit: 38.6 % (ref 37.5–51.0)
Immature Grans (Abs): 0 10*3/uL (ref 0.0–0.1)
Immature Granulocytes: 0 %
LYMPHS: 26 %
Lymphocytes Absolute: 2.3 10*3/uL (ref 0.7–3.1)
MCH: 28.6 pg (ref 26.6–33.0)
MCHC: 35.2 g/dL (ref 31.5–35.7)
MCV: 81 fL (ref 79–97)
Monocytes Absolute: 1 10*3/uL — ABNORMAL HIGH (ref 0.1–0.9)
Monocytes: 12 %
NEUTROS ABS: 5 10*3/uL (ref 1.4–7.0)
Neutrophils: 58 %
Platelets: 282 10*3/uL (ref 150–379)
RBC: 4.75 x10E6/uL (ref 4.14–5.80)
RDW: 15.3 % (ref 12.3–15.4)
WBC: 8.7 10*3/uL (ref 3.4–10.8)

## 2017-04-27 LAB — BASIC METABOLIC PANEL
BUN/Creatinine Ratio: 21 (ref 10–24)
BUN: 17 mg/dL (ref 8–27)
CO2: 21 mmol/L (ref 20–29)
CREATININE: 0.81 mg/dL (ref 0.76–1.27)
Calcium: 10.2 mg/dL (ref 8.6–10.2)
Chloride: 101 mmol/L (ref 96–106)
GFR calc Af Amer: 110 mL/min/{1.73_m2} (ref >59)
GFR calc non Af Amer: 95 mL/min/{1.73_m2} (ref >59)
GLUCOSE: 196 mg/dL — AB (ref 65–99)
Potassium: 4.5 mmol/L (ref 3.5–5.2)
Sodium: 141 mmol/L (ref 134–144)

## 2017-04-27 LAB — TSH: TSH: 5.46 u[IU]/mL — AB (ref 0.450–4.500)

## 2017-04-27 MED ORDER — CARVEDILOL 12.5 MG PO TABS: 12.5000 mg | ORAL_TABLET | Freq: Two times a day (BID) | ORAL | 3 refills | Status: DC

## 2017-04-27 NOTE — Patient Instructions (Signed)
Medication Instructions: ,Your physician has recommended you make the following change in your medication:  -1) INCREASE Carvedilol (Coreg) 12.5 mg - Take 1 tablet by mouth twice daily  Labwork: Your physician has recommended that you have lab work today: CBC, BMET, TSH  Procedures/Testing: None Ordered  Follow-Up: Your physician recommends that you schedule a follow-up appointment in: 4-6 weeks with Richardson Dopp, PA-C   If you need a refill on your cardiac medications before your next appointment, please call your pharmacy.

## 2017-04-27 NOTE — Progress Notes (Signed)
ELECTROPHYSIOLOGY CONSULT NOTE  Patient ID: Grant Moran, MRN: [id number], DOB/AGE: 63-Jan-1957 62 y.o. Admit date: (Not on file) Date of Consult: 04/27/2017  Primary Physician: Candi Leash, PA-C Primary Cardiologist: SW/PNa     Grant Moran is a 62 y.o. male who is being seen today for the evaluation of ICD at the request of PNa /SW.    HPI Grant Moran is a 62 y.o. male with hx of ischemic cardiomyopathy with prior MI 2001 and nSTEMI 8/18 >>3V CAD and underwent CABG x 3.    He denies interval problems with chest pain.  He has modest shortness of breath noticed after he carries in his first bag of groceries.  He denies nocturnal dyspnea orthopnea or peripheral edema.  He has had no syncope or palpitations.  He is unaware of his rapid heart rate.  Last hemoglobin A1c per his PCP in [place] is elevated   DATE TEST    8/18 Cath  3V CAD  8/18 Echo   EF 30 %   11/18 Echo    EF 30 %          Date Cr K Hgb  10/18 0.67 4.5 10.4(8/18)             Past Medical History:  Diagnosis Date  . CAD (coronary artery disease) 11/16/2011   S/p inferior MI in 2001 treated with BMS to the RCA  //  restenosis treated with brachytherapy in 2001 // non-STEMI 8/18 >> LHC with 3 vessel CAD >> s/p CABG (LIMA-LAD, SVG-OM1/distal LCx, SVG-PDA/PLB) - [facility]  . Carotid stenosis    Preoperative carotid US 8/18: Bilateral ICA 1-39  . Chronic systolic CHF (congestive heart failure) ([place]) 11/29/2016   Ischemic CM // Echo 8/18: Diffuse HK worse in the inferior wall, moderate LVH, EF 30, normal diastolic function, mild MR, moderate LAE, PASP 40 // Intra op TEE 8/18: EF 30-35, normal wall motion, trace MR  . Diabetes mellitus ([place])   . Hyperlipidemia   . Hypertension   . Myocardial infarction ([place])    OCT 2001-He under went angioplasty  and  stenting  of the RCA and distal RCA   . PAD (peripheral artery disease) (HCC)    Preoperative ABIs 8/18: R 0.93; L inaccurate due to  calcification/noncompressible  . Stroke [facility])       Surgical History:  Past Surgical History:  Procedure Laterality Date  . ANKLE SURGERY     Trauma  . CORONARY ARTERY BYPASS GRAFT N/A 12/07/2016   Procedure: CORONARY ARTERY BYPASS GRAFTING (CABG) X 5  WITH ENDOSCOPIC HARVESTING OF RIGHT SAPHENOUS VEIN , LIMA-LAD SEQ SVG-OM1-OM2 SEQ SVG-PD-PL;  Surgeon: [name], MD;  Location: [place];  Service: Open Heart Surgery;  Laterality: N/A;  . fx left arm    . RIGHT/LEFT HEART CATH AND CORONARY ANGIOGRAPHY N/A 12/03/2016   Procedure: RIGHT/LEFT HEART CATH AND CORONARY ANGIOGRAPHY;  Surgeon: Martinique, Peter M, MD;  Location: [place] CV LAB;  Service: Cardiovascular;  Laterality: N/A;  . stent sx    . TEE WITHOUT CARDIOVERSION N/A 05/07/2015   Procedure: TRANSESOPHAGEAL ECHOCARDIOGRAM (TEE);  Surgeon: Thayer Headings, MD;  Location: [place];  Service: Cardiovascular;  Laterality: N/A;  . TEE WITHOUT CARDIOVERSION N/A 12/07/2016   Procedure: TRANSESOPHAGEAL ECHOCARDIOGRAM (TEE);  Surgeon: [name], MD;  Location: [place];  Service: Open Heart Surgery;  Laterality: N/A;     Home Meds: Prior to Admission medications   Medication Sig Start  Date End Date Taking? Authorizing Provider  aspirin EC 81 MG tablet Take 1 tablet (81 mg total) by mouth daily. 12/29/16   Richardson Dopp T, PA-C  atorvastatin (LIPITOR) 80 MG tablet Take 1 tablet by mouth daily. 10/31/16   [provider]  carvedilol (COREG) 6.25 MG tablet Take 1 tablet (6.25 mg total) by mouth 2 (two) times daily. 12/30/16   Richardson Dopp T, PA-C  clopidogrel (PLAVIX) 75 MG tablet Take 1 tablet (75 mg total) by mouth daily. 06/03/15   Rosalin Hawking, MD  furosemide (LASIX) 40 MG tablet Take 1 tablet (40 mg total) by mouth daily. 12/12/16 03/03/17  Elgie Collard, PA-C  glyBURIDE-metformin (GLUCOVANCE) 5-500 MG tablet Take 1 tablet by mouth 2 (two) times daily.    [provider]  INVOKANA 300 MG TABS tablet Take 300 mg by  mouth daily. 04/16/15   [provider]  losartan (COZAAR) 50 MG tablet Take 1 tablet (50 mg total) by mouth daily. 05/03/15   Thurnell Lose, MD  sitaGLIPtin (JANUVIA) 100 MG tablet Take by mouth. 05/30/15 01/28/17  [provider]  spironolactone (ALDACTONE) 25 MG tablet Take 0.5 tablets (12.5 mg total) by mouth daily. 01/28/17 04/28/17  Richardson Dopp T, PA-C    Allergies: No Known Allergies  Social History   Socioeconomic History  . Marital status: Single    Spouse name: Not on file  . Number of children: 0  . Years of education: Not on file  . Highest education level: Not on file  Social Needs  . Financial resource strain: Not on file  . Food insecurity - worry: Not on file  . Food insecurity - inability: Not on file  . Transportation needs - medical: Not on file  . Transportation needs - non-medical: Not on file  Occupational History  . Occupation: Scientist, research (medical): [employer]  Tobacco Use  . Smoking status: Former Smoker    Packs/day: 2.00    Years: 20.00    Pack years: 40.00    Types: Cigarettes    Last attempt to quit: 06/18/1990    Years since quitting: 26.8  . Smokeless tobacco: Never Used  Substance and Sexual Activity  . Alcohol use: Yes    Alcohol/week: 0.6 oz    Types: 1 Shots of liquor per week    Comment: occasioinally  . Drug use: No  . Sexual activity: Not Currently  Other Topics Concern  . Not on file  Social History Narrative   Notable for remote tobacco abuse, with a prior three-pack per - day - history  For 20 years . He  Has not smoked since 1992.     Family History  Problem Relation Age of Onset  . Cancer Mother        Colon  . Heart attack Father        CABG  . Heart attack Sister   . Heart attack Maternal Grandfather   . Heart attack Paternal Grandfather      ROS:  Please see the history of present illness.      All other systems reviewed and negative.    Physical Exam:   Blood pressure 122/84, pulse (!) 111,  height 5\' 3"  (1.6 m), weight 173 lb 9.6 oz (78.7 kg), SpO2 97 %. General: Well developed, well nourished male in no acute distress. Head: Normocephalic, atraumatic, sclera non-icteric, no xanthomas, nares are without discharge. EENT: normal  Lymph Nodes:  none Neck: Negative for carotid bruits. JVD  not elevated. Back:without scoliosis kyphosis  Lungs: Clear bilaterally to auscultation without wheezes, rales, or rhonchi. Breathing is unlabored. Heart: R rapid but RRR with S1 S2. No   murmur . No rubs, or gallops appreciated. Abdomen: Soft, non-tender, non-distended with normoactive bowel sounds. No hepatomegaly. No rebound/guarding. No obvious abdominal masses. Msk:  Strength and tone appear normal for age. Extremities: No clubbing or cyanosis. No  edema.  Distal pedal pulses are 2+ and equal bilaterally. Skin: Warm and Dry Neuro: Alert and oriented X 3. CN III-XII intact Grossly normal sensory and motor function . Psych:  Responds to questions appropriately with a normal affect.      Labs: Cardiac Enzymes No results for input(s): CKTOTAL, CKMB, TROPONINI in the last 72 hours. CBC Lab Results  Component Value Date   WBC 8.1 12/11/2016   HGB 10.4 (L) 12/11/2016   HCT 30.4 (L) 12/11/2016   MCV 84.4 12/11/2016   PLT 232 12/11/2016   PROTIME: No results for input(s): LABPROT, INR in the last 72 hours. Chemistry No results for input(s): NA, K, CL, CO2, BUN, CREATININE, CALCIUM, PROT, BILITOT, ALKPHOS, ALT, AST, GLUCOSE in the last 168 hours.  Invalid input(s): LABALBU Lipids Lab Results  Component Value Date   CHOL 128 01/13/2017   HDL 28 (L) 01/13/2017   LDLCALC 61 01/13/2017   TRIG 194 (H) 01/13/2017   BNP No results found for: PROBNP Thyroid Function Tests: No results for input(s): TSH, T4TOTAL, T3FREE, THYROIDAB in the last 72 hours.  Invalid input(s): FREET3 Miscellaneous Lab Results  Component Value Date   DDIMER 0.28 11/29/2016    Radiology/Studies:  No  results found.  EKG: Sinus at 111 Interval 16/11/34  ECG 10/18 sinus at 85 with a similar P wave morphology  Assessment and Plan:  Ischemic Cardiomyopathy  S/p CABG  CHF chronic systolic class 2 b  Diabetes Mellitus    Anemia  Sinus tachycardia   Patient may well be a candidate for ICD for primary prevention.  However, there are a couple of concerns to be addressed.  The first is the cause of his sinus tachycardia and whether it might be contributing to his cardiomyopathy.  To assess the former, we will check a CBC and a TSH.  The latter was normal in August.  The former was low when last checked in August.  The P wave morphologies of his tachycardia are consistent with a having a sinus origin.  In October the heart rate was 85 suggesting a reactive tachycardia.  In addition we will increase his carvedilol with a target of 25 mg twice daily.  Alternatively, low-dose of metoprolol succinate could be added but I think, this would be challenging for the patient.  He may well benefit from corlanor and would undertake a trial of it prior to pursuing ICD implantation.  He may also benefit from the use of Entresto.  We will check his blood work today and increase his carvedilol.  I will asked that he follow-up with Rudene Re and will be glad to see him again as needed        Virl Axe

## 2017-06-01 ENCOUNTER — Encounter: Payer: Self-pay | Admitting: Physician Assistant

## 2017-06-07 NOTE — Progress Notes (Signed)
Cardiology Office Note:    Date:  06/08/2017   ID:  Grant Moran, DOB 1955/07/04, MRN [id number]  PCP:  Candi Leash, PA-C  Cardiologist:  Mertie Moores, MD   Referring MD: Candi Leash, PA-C   Chief Complaint  Patient presents with  . Follow-up    CHF    History of Present Illness:    Grant Moran is a 62 y.o. male with a hx of coronary artery disease status post inferior myocardial infarction in 2001 treated with bare-metal stent and recurrent stenosis treated with brachytherapy, prior stroke, systolic heart failure, hypertension, hyperlipidemia.  Grant Moran was admitted in August 2018 with a non-ST elevation myocardial infarction.  Cardiac catheterization demonstrated three-vessel coronary artery disease and Grant Moran underwent bypass surgery with Dr. Servando Snare.  Echo demonstrated EF 30%.  Follow-up echo in November 2018 demonstrated an EF of 30-35%.  Grant Moran was referred to electrophysiology.  Grant Moran saw Dr. Caryl Comes who noted relative sinus tachycardia.  His beta-blocker was adjusted.  It was felt that Grant Moran needed further titration of his heart failure medications prior to pursuing ICD implantation.  Grant Moran returns for follow-up and further management of congestive heart failure.  Grant Moran is here alone.  Grant Moran remains short of breath with mild to moderate activity.  Grant Moran denies orthopnea or PND.  Grant Moran denies increasing abdominal girth or lower extremity edema.  Grant Moran denies syncope or chest pain.  Grant Moran denies any bleeding issues.  Grant Moran does admit to a diet high in salt.  Grant Moran states Grant Moran eats out at [employer] every morning.  Prior CV studies:   The following studies were reviewed today:  Echo 03/14/17 EF 02-72, grade 1 diastolic dysfunction  Intraoperative TEE 12/07/16 EF 30-35, normal wall motion, trace MR   Carotid US 12/04/16 Bilateral ICA 1-39   ABIs 12/04/16 - Right ABI of 0.93 is suggestive of mild arterial occlusive disease at rest. - Left ABI could not be accurately obtained due to non-compressible  arteries likely due to medial calcification.   Cardiac Catheterization 12/03/16 LAD ostial LAD, mid 70 LCx ostial 50, proximal 100 RCA ostial 95, proximal 90, mid stent patent with 30 ISR, distal stent patent EF 25-35 Referred for CABG   Echocardiogram 11/30/16 Diffuse HK worse in the inferior wall, moderate LVH, EF 30, normal diastolic function, mild MR, moderate LAE, PASP 40   Event monitor 1/17 NSR   Past Medical History:  Diagnosis Date  . CAD (coronary artery disease) 11/16/2011   S/p inferior MI in 2001 treated with BMS to the RCA  //  restenosis treated with brachytherapy in 2001 // non-STEMI 8/18 >> LHC with 3 vessel CAD >> s/p CABG (LIMA-LAD, SVG-OM1/distal LCx, SVG-PDA/PLB) - [facility]  . Carotid stenosis    Preoperative carotid US 8/18: Bilateral ICA 1-39  . Chronic systolic CHF (congestive heart failure) ([place]) 11/29/2016   Ischemic CM // Echo 8/18: Diffuse HK worse in the inferior wall, moderate LVH, EF 30, normal diastolic function, mild MR, moderate LAE, PASP 40 // Intra op TEE 8/18: EF 30-35, normal wall motion, trace MR  . Diabetes mellitus ([place])   . Hyperlipidemia   . Hypertension   . Myocardial infarction ([place])    OCT 2001-Grant Moran under went angioplasty  and  stenting  of the RCA and distal RCA   . PAD (peripheral artery disease) (HCC)    Preoperative ABIs 8/18: R 0.93; L inaccurate due to calcification/noncompressible  . Stroke [facility])     Past Surgical History:  Procedure Laterality Date  .  ANKLE SURGERY     Trauma  . CORONARY ARTERY BYPASS GRAFT N/A 12/07/2016   Procedure: CORONARY ARTERY BYPASS GRAFTING (CABG) X 5  WITH ENDOSCOPIC HARVESTING OF RIGHT SAPHENOUS VEIN , LIMA-LAD SEQ SVG-OM1-OM2 SEQ SVG-PD-PL;  Surgeon: [name], MD;  Location: [place];  Service: Open Heart Surgery;  Laterality: N/A;  . fx left arm    . RIGHT/LEFT HEART CATH AND CORONARY ANGIOGRAPHY N/A 12/03/2016   Procedure: RIGHT/LEFT HEART CATH AND CORONARY ANGIOGRAPHY;  Surgeon: Martinique, Peter  M, MD;  Location: [place] CV LAB;  Service: Cardiovascular;  Laterality: N/A;  . stent sx    . TEE WITHOUT CARDIOVERSION N/A 05/07/2015   Procedure: TRANSESOPHAGEAL ECHOCARDIOGRAM (TEE);  Surgeon: Thayer Headings, MD;  Location: [place];  Service: Cardiovascular;  Laterality: N/A;  . TEE WITHOUT CARDIOVERSION N/A 12/07/2016   Procedure: TRANSESOPHAGEAL ECHOCARDIOGRAM (TEE);  Surgeon: [name], MD;  Location: [place];  Service: Open Heart Surgery;  Laterality: N/A;    Current Medications: Current Meds  Medication Sig  . aspirin EC 81 MG tablet Take 1 tablet (81 mg total) by mouth daily.  Marland Kitchen atorvastatin (LIPITOR) 80 MG tablet Take 1 tablet by mouth daily.  . carvedilol (COREG) 12.5 MG tablet Take 1 tablet (12.5 mg total) by mouth 2 (two) times daily.  . clopidogrel (PLAVIX) 75 MG tablet Take 1 tablet (75 mg total) by mouth daily.  Marland Kitchen glyBURIDE-metformin (GLUCOVANCE) 5-500 MG tablet Take 1 tablet by mouth 2 (two) times daily.  Marland Kitchen HUMALOG KWIKPEN 100 UNIT/ML KiwkPen Inject 6-8 mg as directed 2 (two) times daily.  Marland Kitchen losartan (COZAAR) 50 MG tablet Take 1 tablet (50 mg total) by mouth daily.  Marland Kitchen NOVOFINE 30G X 8 MM MISC   . sitaGLIPtin (JANUVIA) 100 MG tablet Take 100 mg by mouth daily.   Marland Kitchen spironolactone (ALDACTONE) 25 MG tablet Take 0.5 tablets (12.5 mg total) by mouth daily.  Claris Che 100-3.6 UNIT-MG/ML SOPN Inject 30 mg as directed daily.  . [DISCONTINUED] furosemide (LASIX) 40 MG tablet Take 1 tablet (40 mg total) by mouth daily.     Allergies:   Patient has no known allergies.   Social History   Tobacco Use  . Smoking status: Former Smoker    Packs/day: 2.00    Years: 20.00    Pack years: 40.00    Types: Cigarettes    Last attempt to quit: 06/18/1990    Years since quitting: 26.9  . Smokeless tobacco: Never Used  Substance Use Topics  . Alcohol use: Yes    Alcohol/week: 0.6 oz    Types: 1 Shots of liquor per week    Comment: occasioinally  . Drug use: No      Family Hx: The patient's family history includes Cancer in his mother; Heart attack in his father, maternal grandfather, paternal grandfather, and sister.  ROS:   Please see the history of present illness.    Review of Systems  Respiratory: Positive for cough and shortness of breath.   Neurological: Positive for headaches.   All other systems reviewed and are negative.   EKGs/Labs/Other Test Reviewed:    EKG: Rhythm strip is ordered today.  This demonstrates sinus tachycardia with a heart rate of approximately 100-110  Recent Labs: 11/30/2016: B Natriuretic Peptide 389.0 12/08/2016: Magnesium 2.4 01/13/2017: ALT 9 04/27/2017: BUN 17; Creatinine, Ser 0.81; Hemoglobin 13.6; Platelets 282; Potassium 4.5; Sodium 141; TSH 5.460   Recent Lipid Panel Lab Results  Component Value Date/Time   CHOL 128  01/13/2017 10:01 AM   TRIG 194 (H) 01/13/2017 10:01 AM   HDL 28 (L) 01/13/2017 10:01 AM   CHOLHDL 4.6 01/13/2017 10:01 AM   CHOLHDL 2.2 11/30/2016 05:03 AM   LDLCALC 61 01/13/2017 10:01 AM    Physical Exam:    VS:  BP 120/60   Pulse (!) 104   Ht 5\' 3"  (1.6 m)   Wt 179 lb 12.8 oz (81.6 kg)   SpO2 90%   BMI 31.85 kg/m     Wt Readings from Last 3 Encounters:  06/08/17 179 lb 12.8 oz (81.6 kg)  04/27/17 173 lb 9.6 oz (78.7 kg)  03/03/17 165 lb (74.8 kg)     Physical Exam  Constitutional: Grant Moran is oriented to person, place, and time. Grant Moran appears well-developed and well-nourished. No distress.  HENT:  Head: Normocephalic and atraumatic.  Neck: No JVD present.  Cardiovascular: Normal rate and regular rhythm.  No murmur heard. Pulmonary/Chest: Effort normal. Grant Moran has no rales.  Abdominal: Grant Moran exhibits distension.  Musculoskeletal: Grant Moran exhibits no edema.  Neurological: Grant Moran is alert and oriented to person, place, and time.  Skin: Skin is warm and dry.    ASSESSMENT & PLAN:    1.  Acute on chronic systolic CHF (congestive heart failure) (HCC) EF 30-35.  NYHA 3.  His weight is up 6  pounds since last seen.  Grant Moran has dyspnea with mild to moderate activities.  Grant Moran has a diet high in salt.  Although his exam is not suggestive of volume excess, I suspect Grant Moran is volume overloaded.    -I will obtain a BNP, BMET today.    -I will increase his Lasix to 40 mg twice a day for 3 days.    -If his BNP is significantly elevated, I will keep him on Lasix 40 mg twice daily until follow-up.   -Repeat BMET 1 week.    -Follow-up with me in 1 week.  -Low-salt diet  -As volume improves, adjust beta-blocker therapy vs add Corlanor for improved heart rate  2.  Coronary artery disease History of inferior MI in 2001 treated bare-metal stent followed by brachytherapy for restenosis and status post CABG in the setting of non-ST elevation myocardial infarction in August 2018.  Grant Moran denies anginal symptoms.  Continue aspirin, clopidogrel, statin, beta-blocker, ARB.  3.  Essential hypertension  The patient's blood pressure is controlled on his current regimen.  Continue current therapy.   4.  Pure hypercholesterolemia LDL optimal on most recent lab work.  Continue current Rx.     Dispo:  Return in about 1 week (around 06/15/2017) for Close Follow Up, w/ Richardson Dopp, PA-C.   Medication Adjustments/Labs and Tests Ordered: Current medicines are reviewed at length with the patient today.  Concerns regarding medicines are outlined above.  Tests Ordered: Orders Placed This Encounter  Procedures  . Basic metabolic panel  . Pro b natriuretic peptide (BNP)  . Basic metabolic panel  . EKG 12-Lead   Medication Changes: Meds ordered this encounter  Medications  . furosemide (LASIX) 40 MG tablet    Sig: Take 1 tablet twice daily for 3 days, then resume 40 mg once daily    Dispense:  60 tablet    Refill:  11    Order Specific Question:   Supervising Provider    Answer:   Lauree Chandler D [[id number]]    Signed, Richardson Dopp, PA-C  06/08/2017 8:38 AM    [facility] [place], [place]  [address] Phone: [phone]; Fax: [phone]

## 2017-06-08 ENCOUNTER — Encounter: Payer: Self-pay | Admitting: Physician Assistant

## 2017-06-08 ENCOUNTER — Ambulatory Visit: Payer: BLUE CROSS/BLUE SHIELD | Admitting: Physician Assistant

## 2017-06-08 ENCOUNTER — Encounter (INDEPENDENT_AMBULATORY_CARE_PROVIDER_SITE_OTHER): Payer: Self-pay

## 2017-06-08 VITALS — BP 120/60 | HR 104 | Ht 63.0 in | Wt 179.8 lb

## 2017-06-08 DIAGNOSIS — E7800 Pure hypercholesterolemia, unspecified: Secondary | ICD-10-CM

## 2017-06-08 DIAGNOSIS — I10 Essential (primary) hypertension: Secondary | ICD-10-CM | POA: Diagnosis not present

## 2017-06-08 DIAGNOSIS — I5023 Acute on chronic systolic (congestive) heart failure: Secondary | ICD-10-CM

## 2017-06-08 DIAGNOSIS — I2510 Atherosclerotic heart disease of native coronary artery without angina pectoris: Secondary | ICD-10-CM

## 2017-06-08 LAB — BASIC METABOLIC PANEL
BUN/Creatinine Ratio: 15 (ref 10–24)
BUN: 13 mg/dL (ref 8–27)
CALCIUM: 9.5 mg/dL (ref 8.6–10.2)
CHLORIDE: 100 mmol/L (ref 96–106)
CO2: 20 mmol/L (ref 20–29)
Creatinine, Ser: 0.85 mg/dL (ref 0.76–1.27)
GFR calc non Af Amer: 93 mL/min/{1.73_m2} (ref >59)
GFR, EST AFRICAN AMERICAN: 108 mL/min/{1.73_m2} (ref >59)
Glucose: 215 mg/dL — ABNORMAL HIGH (ref 65–99)
Potassium: 4.4 mmol/L (ref 3.5–5.2)
Sodium: 139 mmol/L (ref 134–144)

## 2017-06-08 LAB — PRO B NATRIURETIC PEPTIDE: NT-Pro BNP: 440 pg/mL — ABNORMAL HIGH (ref 0–210)

## 2017-06-08 MED ORDER — FUROSEMIDE 40 MG PO TABS: ORAL_TABLET | ORAL | 11 refills | Status: DC

## 2017-06-08 NOTE — Patient Instructions (Signed)
Medication Instructions:  Increase your Lasix (Furosemide) to 40 mg twice daily for 3 days (you can take it 6 hours apart) After 3 days, resume Lasix 40 mg Once daily   Labwork: Today - BMET, BNP 1 week - BMET   Testing/Procedures: None   Follow-Up: Richardson Dopp, PA-C in 1 week  Any Other Special Instructions Will Be Listed Below (If Applicable). You need to reduce salt in your diet.  This will help you shortness of breath.  Fast food restaurants have a lot of salt in their foods.  If you need a refill on your cardiac medications before your next appointment, please call your pharmacy.    Low-Sodium Eating Plan Sodium, which is an element that makes up salt, helps you maintain a healthy balance of fluids in your body. Too much sodium can increase your blood pressure and cause fluid and waste to be held in your body. Your health care provider or dietitian may recommend following this plan if you have high blood pressure (hypertension), kidney disease, liver disease, or heart failure. Eating less sodium can help lower your blood pressure, reduce swelling, and protect your heart, liver, and kidneys. What are tips for following this plan? General guidelines  Most people on this plan should limit their sodium intake to 1,500-2,000 mg (milligrams) of sodium each day. Reading food labels  The Nutrition Facts label lists the amount of sodium in one serving of the food. If you eat more than one serving, you must multiply the listed amount of sodium by the number of servings.  Choose foods with less than 140 mg of sodium per serving.  Avoid foods with 300 mg of sodium or more per serving. Shopping  Look for lower-sodium products, often labeled as "low-sodium" or "no salt added."  Always check the sodium content even if foods are labeled as "unsalted" or "no salt added".  Buy fresh foods. ? Avoid canned foods and premade or frozen meals. ? Avoid canned, cured, or processed meats  Buy  breads that have less than 80 mg of sodium per slice. Cooking  Eat more home-cooked food and less restaurant, buffet, and fast food.  Avoid adding salt when cooking. Use salt-free seasonings or herbs instead of table salt or sea salt. Check with your health care provider or pharmacist before using salt substitutes.  Cook with plant-based oils, such as canola, sunflower, or olive oil. Meal planning  When eating at a restaurant, ask that your food be prepared with less salt or no salt, if possible.  Avoid foods that contain MSG (monosodium glutamate). MSG is sometimes added to [place] food, bouillon, and some canned foods. What foods are recommended? The items listed may not be a complete list. Talk with your dietitian about what dietary choices are best for you. Grains Low-sodium cereals, including oats, puffed wheat and rice, and shredded wheat. Low-sodium crackers. Unsalted rice. Unsalted pasta. Low-sodium bread. Whole-grain breads and whole-grain pasta. Vegetables Fresh or frozen vegetables. "No salt added" canned vegetables. "No salt added" tomato sauce and paste. Low-sodium or reduced-sodium tomato and vegetable juice. Fruits Fresh, frozen, or canned fruit. Fruit juice. Meats and other protein foods Fresh or frozen (no salt added) meat, poultry, seafood, and fish. Low-sodium canned tuna and salmon. Unsalted nuts. Dried peas, beans, and lentils without added salt. Unsalted canned beans. Eggs. Unsalted nut butters. Dairy Milk. Soy milk. Cheese that is naturally low in sodium, such as ricotta cheese, fresh mozzarella, or Swiss cheese Low-sodium or reduced-sodium cheese. Cream cheese. Yogurt.  Fats and oils Unsalted butter. Unsalted margarine with no trans fat. Vegetable oils such as canola or olive oils. Seasonings and other foods Fresh and dried herbs and spices. Salt-free seasonings. Low-sodium mustard and ketchup. Sodium-free salad dressing. Sodium-free light mayonnaise. Fresh or  refrigerated horseradish. Lemon juice. Vinegar. Homemade, reduced-sodium, or low-sodium soups. Unsalted popcorn and pretzels. Low-salt or salt-free chips. What foods are not recommended? The items listed may not be a complete list. Talk with your dietitian about what dietary choices are best for you. Grains Instant hot cereals. Bread stuffing, pancake, and biscuit mixes. Croutons. Seasoned rice or pasta mixes. Noodle soup cups. Boxed or frozen macaroni and cheese. Regular salted crackers. Self-rising flour. Vegetables Sauerkraut, pickled vegetables, and relishes. Olives. [place] fries. Onion rings. Regular canned vegetables (not low-sodium or reduced-sodium). Regular canned tomato sauce and paste (not low-sodium or reduced-sodium). Regular tomato and vegetable juice (not low-sodium or reduced-sodium). Frozen vegetables in sauces. Meats and other protein foods Meat or fish that is salted, canned, smoked, spiced, or pickled. Bacon, ham, sausage, hotdogs, corned beef, chipped beef, packaged lunch meats, salt pork, jerky, pickled herring, anchovies, regular canned tuna, sardines, salted nuts. Dairy Processed cheese and cheese spreads. Cheese curds. Blue cheese. Feta cheese. String cheese. Regular cottage cheese. Buttermilk. Canned milk. Fats and oils Salted butter. Regular margarine. Ghee. Bacon fat. Seasonings and other foods Onion salt, garlic salt, seasoned salt, table salt, and sea salt. Canned and packaged gravies. Worcestershire sauce. Tartar sauce. Barbecue sauce. Teriyaki sauce. Soy sauce, including reduced-sodium. Steak sauce. Fish sauce. Oyster sauce. Cocktail sauce. Horseradish that you find on the shelf. Regular ketchup and mustard. Meat flavorings and tenderizers. Bouillon cubes. Hot sauce and Tabasco sauce. Premade or packaged marinades. Premade or packaged taco seasonings. Relishes. Regular salad dressings. Salsa. Potato and tortilla chips. Corn chips and puffs. Salted popcorn and pretzels.  Canned or dried soups. Pizza. Frozen entrees and pot pies. Summary  Eating less sodium can help lower your blood pressure, reduce swelling, and protect your heart, liver, and kidneys.  Most people on this plan should limit their sodium intake to 1,500-2,000 mg (milligrams) of sodium each day.  Canned, boxed, and frozen foods are high in sodium. Restaurant foods, fast foods, and pizza are also very high in sodium. You also get sodium by adding salt to food.  Try to cook at home, eat more fresh fruits and vegetables, and eat less fast food, canned, processed, or prepared foods. This information is not intended to replace advice given to you by your health care provider. Make sure you discuss any questions you have with your health care provider. Document Released: 10/02/2001 Document Revised: 04/05/2016 Document Reviewed: 04/05/2016 Elsevier Interactive Patient Education  [employer].

## 2017-06-15 ENCOUNTER — Encounter: Payer: Self-pay | Admitting: Physician Assistant

## 2017-06-15 ENCOUNTER — Ambulatory Visit: Payer: BLUE CROSS/BLUE SHIELD | Admitting: Physician Assistant

## 2017-06-15 ENCOUNTER — Other Ambulatory Visit: Payer: BLUE CROSS/BLUE SHIELD | Admitting: *Deleted

## 2017-06-15 VITALS — BP 122/60 | HR 104 | Ht 63.0 in | Wt 182.0 lb

## 2017-06-15 DIAGNOSIS — I10 Essential (primary) hypertension: Secondary | ICD-10-CM | POA: Diagnosis not present

## 2017-06-15 DIAGNOSIS — I5022 Chronic systolic (congestive) heart failure: Secondary | ICD-10-CM | POA: Diagnosis not present

## 2017-06-15 DIAGNOSIS — I2510 Atherosclerotic heart disease of native coronary artery without angina pectoris: Secondary | ICD-10-CM | POA: Diagnosis not present

## 2017-06-15 LAB — BASIC METABOLIC PANEL
BUN / CREAT RATIO: 15 (ref 10–24)
BUN: 13 mg/dL (ref 8–27)
CHLORIDE: 98 mmol/L (ref 96–106)
CO2: 21 mmol/L (ref 20–29)
CREATININE: 0.89 mg/dL (ref 0.76–1.27)
Calcium: 9.6 mg/dL (ref 8.6–10.2)
GFR calc Af Amer: 106 mL/min/{1.73_m2} (ref >59)
GFR calc non Af Amer: 92 mL/min/{1.73_m2} (ref >59)
GLUCOSE: 249 mg/dL — AB (ref 65–99)
Potassium: 4.1 mmol/L (ref 3.5–5.2)
SODIUM: 139 mmol/L (ref 134–144)

## 2017-06-15 MED ORDER — CARVEDILOL 12.5 MG PO TABS: 18.7500 mg | ORAL_TABLET | Freq: Two times a day (BID) | ORAL | 11 refills | Status: AC

## 2017-06-15 NOTE — Patient Instructions (Addendum)
Medication Instructions:  INCREASE Coreg (Carvedilol) 12.5 mg - take 1 and 1/2 tablets (18.75 mg) twice daily (12 hours apart)  Labwork: Today - BMET   Testing/Procedures: None   Follow-Up: Richardson Dopp, PA-C ON 07/06/17 @ 8:15   Any Other Special Instructions Will Be Listed Below (If Applicable).  If you need a refill on your cardiac medications before your next appointment, please call your pharmacy.

## 2017-06-15 NOTE — Progress Notes (Signed)
Cardiology Office Note:    Date:  06/15/2017   ID:  Grant Moran, DOB 27-Jun-1955, MRN [id number]  PCP:  Candi Leash, PA-C  Cardiologist:  Mertie Moores, MD   Referring MD: Candi Leash, PA-C   Chief Complaint  Patient presents with  . Follow-up    Heart failure    History of Present Illness:    Grant Moran is a 62 y.o. male with coronary artery disease status post inferior myocardial infarction in 2001 treated with bare-metal stent and recurrent stenosis treated with brachytherapy, prior stroke, systolic heart failure, hypertension, hyperlipidemia.  He was admitted in August 2018 with a non-ST elevation myocardial infarction.  Cardiac catheterization demonstrated three-vessel coronary artery disease and he underwent bypass surgery with Dr. Servando Snare.  Echo demonstrated EF 30%.  Follow-up echo in November 2018 demonstrated an EF of 30-35%.  He was referred to electrophysiology.  He saw Dr. Caryl Comes who noted relative sinus tachycardia.  His beta-blocker was adjusted.  It was felt that he needed further titration of his heart failure medications prior to pursuing ICD implantation.  [name] returns for follow-up of congestive heart failure.  He was last seen 06/08/17.  He remained volume overloaded and I adjusted his Lasix.  He also admitted to a diet high in salt and we discussed the importance of low-sodium diet.  Today, he is here alone.  He is doing about the same.  He continues to eat at fast food restaurants ([employer]).  He denies orthopnea or PND.  He denies significant dyspnea.  He denies anginal chest discomfort.  He denies edema or syncope.  Prior CV studies:   The following studies were reviewed today:  Echo 03/14/17 EF 46-27, grade 1 diastolic dysfunction  Intraoperative TEE 12/07/16 EF 30-35, normal wall motion, trace MR  Carotid US 12/04/16 Bilateral ICA 1-39  ABIs 12/04/16 - Right ABI of 0.93 is suggestive of mild arterial occlusive disease at rest. - Left ABI  could not be accurately obtained due to non-compressible arteries likely due to medial calcification.  Cardiac Catheterization 12/03/16 LAD ostial LAD, mid 70 LCx ostial 50, proximal 100 RCA ostial 95, proximal 90, mid stent patent with 30 ISR, distal stent patent EF 25-35 Referred for CABG  Echocardiogram 11/30/16 Diffuse HK worse in the inferior wall, moderate LVH, EF 30, normal diastolic function, mild MR, moderate LAE, PASP 40  Event monitor 1/17 NSR  Past Medical History:  Diagnosis Date  . CAD (coronary artery disease) 11/16/2011   S/p inferior MI in 2001 treated with BMS to the RCA  //  restenosis treated with brachytherapy in 2001 // non-STEMI 8/18 >> LHC with 3 vessel CAD >> s/p CABG (LIMA-LAD, SVG-OM1/distal LCx, SVG-PDA/PLB) - [facility]  . Carotid stenosis    Preoperative carotid US 8/18: Bilateral ICA 1-39  . Chronic systolic CHF (congestive heart failure) ([place]) 11/29/2016   Ischemic CM // Echo 8/18: Diffuse HK worse in the inferior wall, moderate LVH, EF 30, normal diastolic function, mild MR, moderate LAE, PASP 40 // Intra op TEE 8/18: EF 30-35, normal wall motion, trace MR  . Diabetes mellitus ([place])   . Hyperlipidemia   . Hypertension   . Myocardial infarction ([place])    OCT 2001-He under went angioplasty  and  stenting  of the RCA and distal RCA   . PAD (peripheral artery disease) (HCC)    Preoperative ABIs 8/18: R 0.93; L inaccurate due to calcification/noncompressible  . Stroke [facility])     Past Surgical History:  Procedure  Laterality Date  . ANKLE SURGERY     Trauma  . CORONARY ARTERY BYPASS GRAFT N/A 12/07/2016   Procedure: CORONARY ARTERY BYPASS GRAFTING (CABG) X 5  WITH ENDOSCOPIC HARVESTING OF RIGHT SAPHENOUS VEIN , LIMA-LAD SEQ SVG-OM1-OM2 SEQ SVG-PD-PL;  Surgeon: [name], MD;  Location: [facility];  Service: Open Heart Surgery;  Laterality: N/A;  . fx left arm    . RIGHT/LEFT HEART CATH AND CORONARY ANGIOGRAPHY N/A 12/03/2016   Procedure: RIGHT/LEFT HEART  CATH AND CORONARY ANGIOGRAPHY;  Surgeon: [place], Peter M, MD;  Location: [facility];  Service: Cardiovascular;  Laterality: N/A;  . stent sx    . TEE WITHOUT CARDIOVERSION N/A 05/07/2015   Procedure: TRANSESOPHAGEAL ECHOCARDIOGRAM (TEE);  Surgeon: Thayer Headings, MD;  Location: [facility];  Service: Cardiovascular;  Laterality: N/A;  . TEE WITHOUT CARDIOVERSION N/A 12/07/2016   Procedure: TRANSESOPHAGEAL ECHOCARDIOGRAM (TEE);  Surgeon: [name], MD;  Location: [facility];  Service: Open Heart Surgery;  Laterality: N/A;    Current Medications: Current Meds  Medication Sig  . aspirin EC 81 MG tablet Take 1 tablet (81 mg total) by mouth daily.  Marland Kitchen atorvastatin (LIPITOR) 80 MG tablet Take 1 tablet by mouth daily.  . clopidogrel (PLAVIX) 75 MG tablet Take 1 tablet (75 mg total) by mouth daily.  . furosemide (LASIX) 40 MG tablet Take 40 mg by mouth daily.  Marland Kitchen glyBURIDE-metformin (GLUCOVANCE) 5-500 MG tablet Take 1 tablet by mouth 2 (two) times daily.  Marland Kitchen HUMALOG KWIKPEN 100 UNIT/ML KiwkPen Inject 6-8 mg as directed 2 (two) times daily.  Marland Kitchen losartan (COZAAR) 50 MG tablet Take 1 tablet (50 mg total) by mouth daily.  Marland Kitchen NOVOFINE 30G X 8 MM MISC   . sitaGLIPtin (JANUVIA) 100 MG tablet Take 100 mg by mouth daily.   Marland Kitchen spironolactone (ALDACTONE) 25 MG tablet Take 0.5 tablets (12.5 mg total) by mouth daily.  Claris Che 100-3.6 UNIT-MG/ML SOPN Inject 30 mg as directed daily.  . [DISCONTINUED] carvedilol (COREG) 12.5 MG tablet Take 1 tablet (12.5 mg total) by mouth 2 (two) times daily.     Allergies:   Patient has no known allergies.   Social History   Tobacco Use  . Smoking status: Former Smoker    Packs/day: 2.00    Years: 20.00    Pack years: 40.00    Types: Cigarettes    Last attempt to quit: 06/18/1990    Years since quitting: 27.0  . Smokeless tobacco: Never Used  Substance Use Topics  . Alcohol use: Yes    Alcohol/week: 0.6 oz    Types: 1 Shots of liquor per week    Comment:  occasioinally  . Drug use: No     Family Hx: The patient's family history includes Cancer in his mother; Heart attack in his father, maternal grandfather, paternal grandfather, and sister.  ROS:   Please see the history of present illness.    ROS All other systems reviewed and are negative.   EKGs/Labs/Other Test Reviewed:    EKG:  EKG is not ordered today.    Recent Labs: 11/30/2016: B Natriuretic Peptide 389.0 12/08/2016: Magnesium 2.4 01/13/2017: ALT 9 04/27/2017: Hemoglobin 13.6; Platelets 282; TSH 5.460 06/08/2017: BUN 13; Creatinine, Ser 0.85; NT-Pro BNP 440; Potassium 4.4; Sodium 139   Recent Lipid Panel Lab Results  Component Value Date/Time   CHOL 128 01/13/2017 10:01 AM   TRIG 194 (H) 01/13/2017 10:01 AM   HDL 28 (L) 01/13/2017 10:01 AM   CHOLHDL 4.6  01/13/2017 10:01 AM   CHOLHDL 2.2 11/30/2016 05:03 AM   LDLCALC 61 01/13/2017 10:01 AM    Physical Exam:    VS:  BP 122/60   Pulse (!) 104   Ht 5\' 3"  (1.6 m)   Wt 182 lb (82.6 kg)   SpO2 99%   BMI 32.24 kg/m     Wt Readings from Last 3 Encounters:  06/15/17 182 lb (82.6 kg)  06/08/17 179 lb 12.8 oz (81.6 kg)  04/27/17 173 lb 9.6 oz (78.7 kg)     Physical Exam  Constitutional: He is oriented to person, place, and time. He appears well-developed and well-nourished. No distress.  HENT:  Head: Normocephalic and atraumatic.  Eyes: No scleral icterus.  Neck: No JVD present.  Cardiovascular: Normal rate and regular rhythm.  No murmur heard. Pulmonary/Chest: Effort normal. He has no rales.  Abdominal: Soft.  Musculoskeletal: He exhibits no edema.  Neurological: He is alert and oriented to person, place, and time.  Skin: Skin is warm and dry.    ASSESSMENT & PLAN:    1.  Chronic systolic CHF (congestive heart failure) (HCC) His weight continues to go up.  However, his symptoms seem to be improved.  He is NYHA 2.  Most recent echo demonstrated EF 30-35.  He continues to have a diet high in salt.  We again  discussed the importance of a low-salt diet today.  We discussed the potential for worsening volume overload and possibly admission to the hospital if his diet remains high in salt.  He needs better heart rate control.  He is only taking Coreg once a day.  It is unclear if he ever took a higher dose of Lasix as directed at last visit.  -Continue current dose of losartan, spironolactone, furosemide  -Increase carvedilol to 18.75 mg twice daily  -Follow-up 3 weeks  -Consider Ivabridine if heart rate remains >70 on maximum tolerated beta-blocker dose  2.  Coronary artery disease History of inferior MI in 2001 treated bare-metal stent followed by brachytherapy for restenosis and status post CABG in the setting of non-ST elevation myocardial infarction in August 2018.  He denies anginal symptoms.  Continue aspirin, Plavix, beta-blocker, ARB, statin.  3.  Essential hypertension Blood pressure at target.  I did advise him to contact me if he develops symptomatic hypotension with the adjustment his beta-blocker therapy.  We may need to consider decreasing his losartan dose.   Dispo:  Return in about 3 weeks (around 07/06/2017) for Close Follow Up, w/ Richardson Dopp, PA-C.   Medication Adjustments/Labs and Tests Ordered: Current medicines are reviewed at length with the patient today.  Concerns regarding medicines are outlined above.  Tests Ordered: No orders of the defined types were placed in this encounter.  Medication Changes: Meds ordered this encounter  Medications  . carvedilol (COREG) 12.5 MG tablet    Sig: Take 1.5 tablets (18.75 mg total) by mouth 2 (two) times daily.    Dispense:  90 tablet    Refill:  11    Order Specific Question:   Supervising Provider    Answer:   Lauree Chandler D [[id number]]    Signed, Richardson Dopp, PA-C  06/15/2017 10:52 AM    [facility] [place], [place], [place]  [address] Phone: [phone]; Fax: [phone]

## 2017-07-06 ENCOUNTER — Ambulatory Visit: Payer: BLUE CROSS/BLUE SHIELD | Admitting: Physician Assistant

## 2017-09-21 IMAGING — DX DG CHEST 1V PORT
1 series · 1 of 1 positions shown · non-contrast
Comparison: Portable chest x-ray December 07, 2016

CLINICAL DATA: Status post CABG yesterday.

EXAM:
PORTABLE CHEST 1 VIEW

[chest]
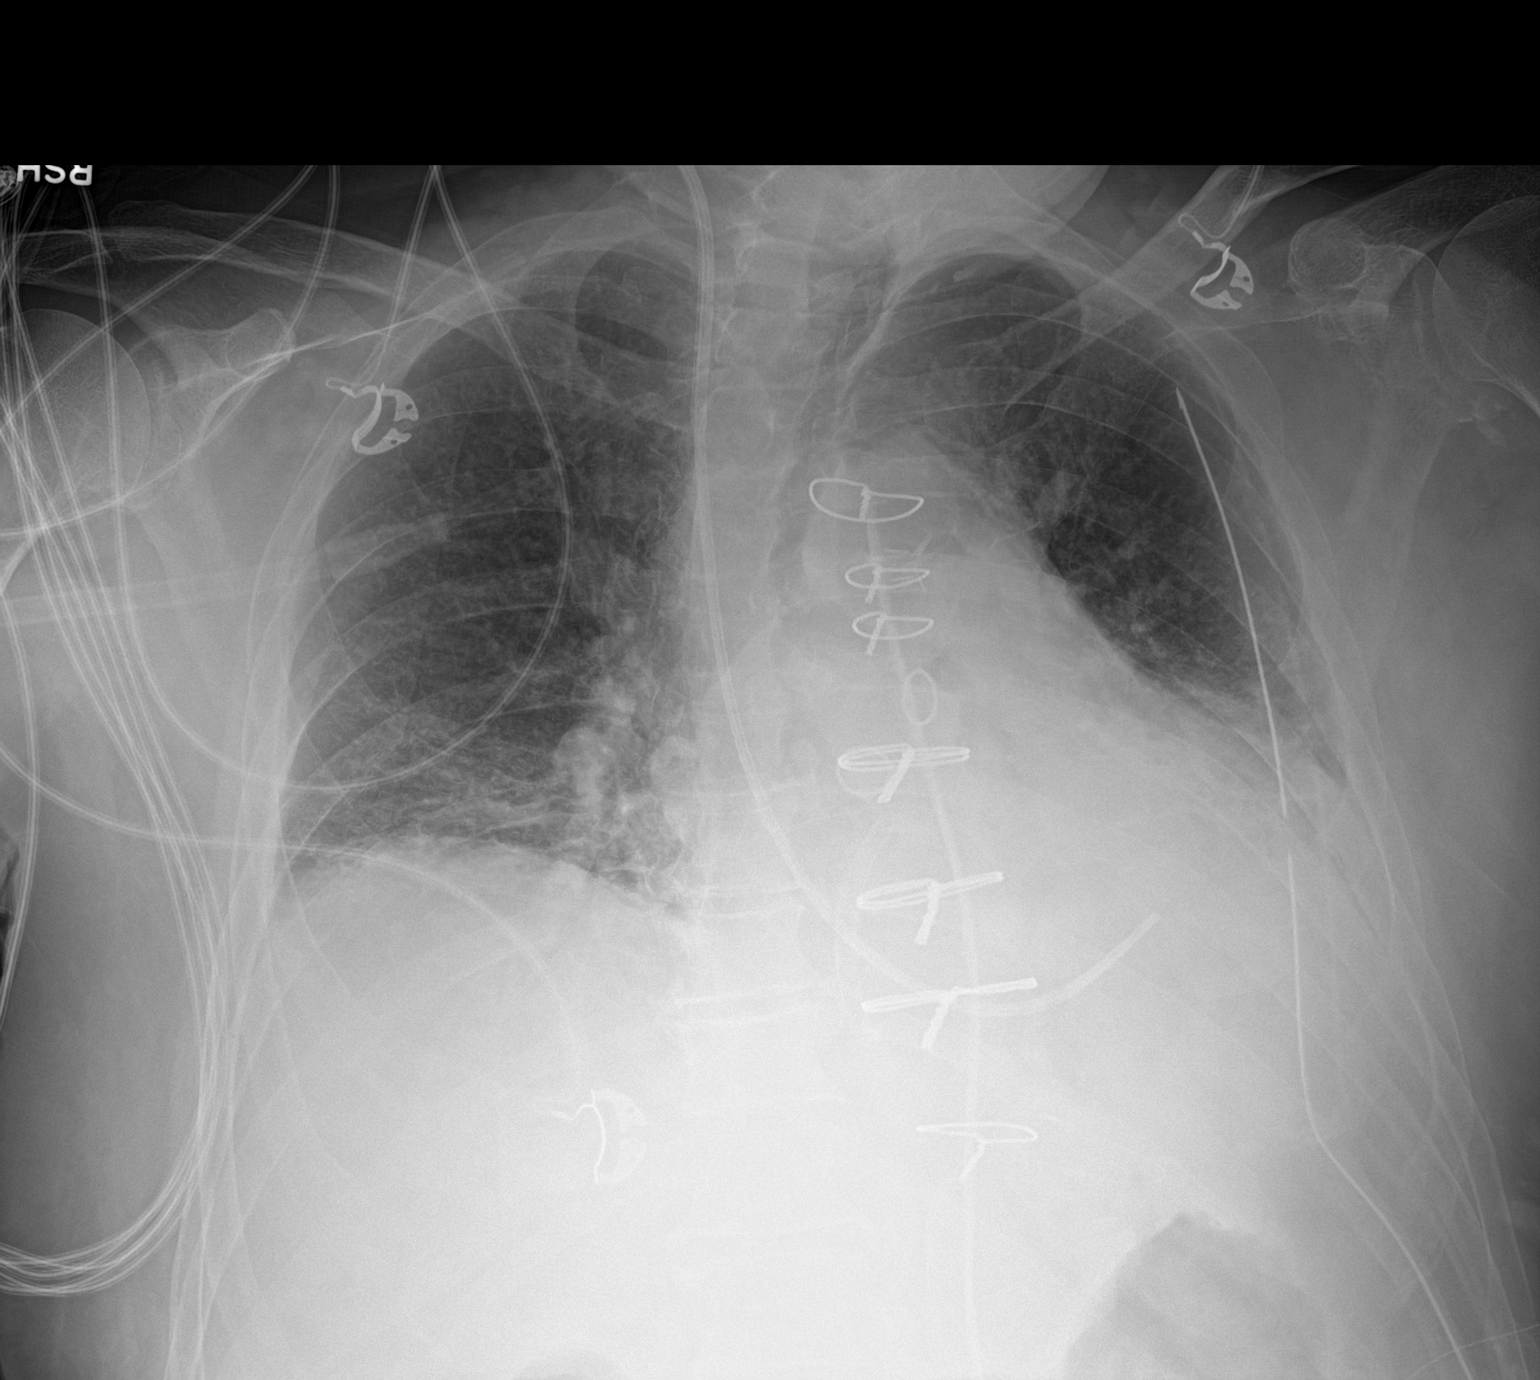

[1 of 1 positions shown; findings below may reference images not displayed]

FINDINGS: There has been interval extubation of the trachea and of the
esophagus. The lungs are slightly better inflated but remain
hypoinflated. There is no pneumothorax. The mediastinal drain and
the left chest tube are in stable position. The cardiac silhouette
is mildly enlarged. A right coronary artery stent is visible. The
retrocardiac region is slightly more dense today. The Swan-Ganz
catheter has been withdrawn such that the tip lies in the left
ventricle.
IMPRESSION: Slight interval improvement in inflation of both lungs. Persistent
mild interstitial edema and bibasilar atelectasis. No pneumothorax.

Positioning of the Swan-Ganz catheter tip is now in the left
ventricular chamber. Advancement or removal of the catheter is
recommended. Critical Value/emergent results were called by
telephone at the time of interpretation on 12/08/2016 at [DATE] to
Zadi Tiger, RN, who verbally acknowledged these results.

## 2017-09-24 IMAGING — CR DG CHEST 2V
2 series · 2 of 2 positions shown · non-contrast
Comparison: 12/10/2016

CLINICAL DATA: Patient had 5 bypass heart surgery on [REDACTED] of
This week. Has had a cough and congestion, with a yellow discharge,
since [REDACTED] after the surgery. No SOB. Pain in center chest but
only with a hard cough. Diabetic takes medication/insulin. CAD.
Chart shows HTN but he said he does not. Prior Myocardial infarction
January 2000. Heart cath 12/03/2016. Coronary artery bypass graft
12/07/2016. Qmones without cardioversion 05/07/2015, and 12/07/2016.

EXAM:
CHEST  2 VIEW

[chest lat]
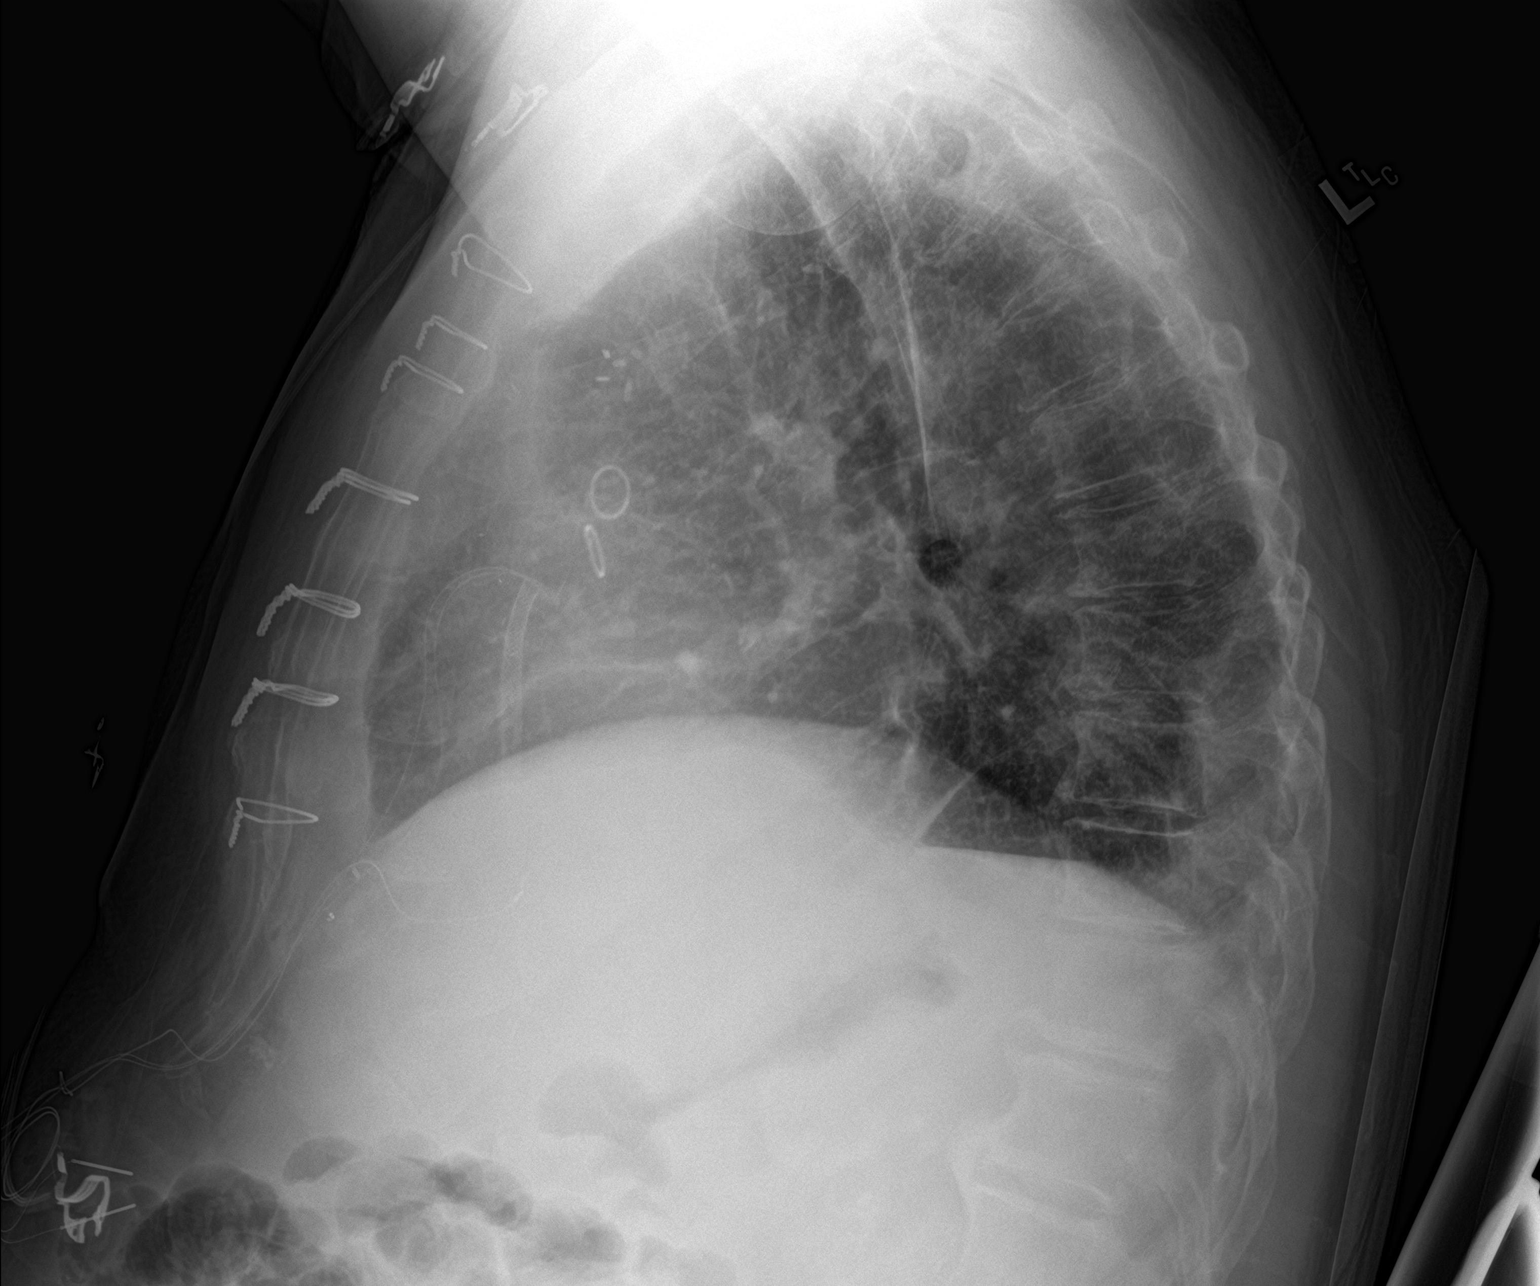

[chest ap]
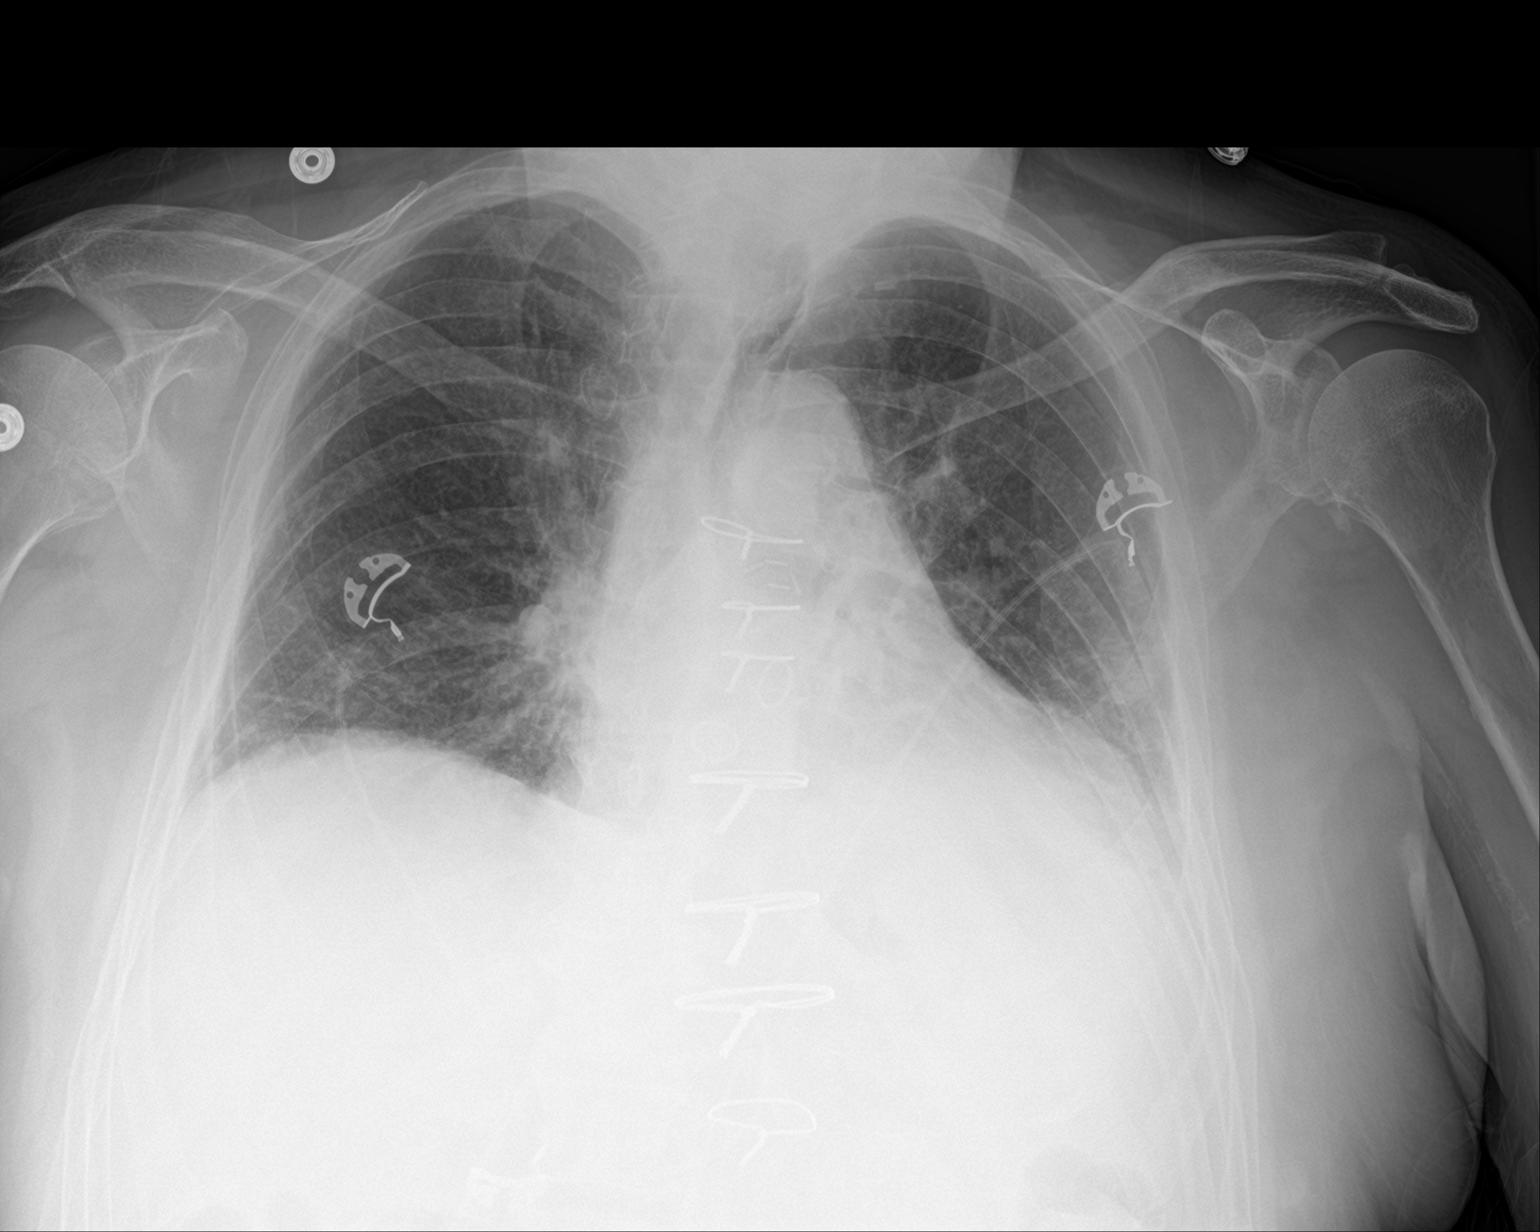

[2 of 2 positions shown; findings below may reference images not displayed]

FINDINGS: There is persistent left lung base opacity which is likely
atelectasis. Pneumonia is possible. Remainder of the lungs is clear.
No convincing pleural effusion. No pneumothorax.

Cardiac silhouette is normal in size. Stable changes from CABG
surgery. No mediastinal or hilar masses. No mediastinal widening.

Skeletal structures are demineralized but intact.

All lines and tubes have been removed.
IMPRESSION: 1. Persistent left lung base opacity most likely atelectasis.
2. Lungs otherwise clear.  No pulmonary edema.  No pneumothorax.

## 2018-12-22 ENCOUNTER — Telehealth: Payer: Self-pay | Admitting: *Deleted

## 2018-12-22 NOTE — Telephone Encounter (Signed)
   [facility] Pre-operative Risk Assessment    Request for surgical clearance:  1. What type of surgery is being performed? ENDOSCOPY / AND OR / COLONOSCOPY  2. When is this surgery scheduled?  01-08-2019   3. What type of clearance is required (medical clearance vs. Pharmacy clearance to hold med vs. Both)?  PHARMACY  4. Are there any medications that need to be held prior to surgery and how long? PLAVIX X'S 4 DAYS   5. Practice name and name of physician performing surgery?  [facility]   6. What is your office phone number [phone]    7.   What is your office fax number [phone]  8.   Anesthesia type (None, local, MAC, general) ?  N/A   Jeanann Lewandowsky 12/22/2018, 3:28 PM  _________________________________________________________________   (provider comments below)

## 2018-12-24 NOTE — Telephone Encounter (Signed)
   Primary Cardiologist:Philip Nahser, MD  Chart reviewed as part of pre-operative protocol coverage. Because of FLORES BARRIERE past medical history and time since last visit, he/she will require a follow-up visit in order to better assess preoperative cardiovascular risk.  Pre-op covering staff: - Please schedule appointment and call patient to inform them. - Please contact requesting surgeon's office via preferred method (i.e, phone, fax) to inform them of need for appointment prior to surgery.  If applicable, this message will also be routed to pharmacy pool and/or primary cardiologist for input on holding anticoagulant/antiplatelet agent as requested below so that this information is available at time of patient's appointment.   [place], [place]  12/24/2018, 4:42 PM

## 2018-12-25 NOTE — Telephone Encounter (Signed)
Spoke with to schedule appt for pre-op clearance. Pt has an appt with Pecolia Ades on 9/9 at 8 am. Pt verbalized understanding and thanked me for the call.  I will send fax over to requesting surgeon's office and remove for pre-op pool callback.

## 2019-01-03 ENCOUNTER — Ambulatory Visit: Payer: BLUE CROSS/BLUE SHIELD | Admitting: Cardiology

## 2019-01-03 NOTE — Progress Notes (Deleted)
Cardiology Office Note:    Date:  01/03/2019   ID:  Grant Moran, DOB March 26, 1956, MRN [id number]  PCP:  Candi Leash, PA-C  Cardiologist:  Mertie Moores, MD  Referring MD: Candi Leash, PA-C   No chief complaint on file. ***  History of Present Illness:    Grant Moran is a 63 y.o. male with a past medical history significant for CAD s/p inferior MI 2001 treated with BMS and recurrent stenosis treated with brachytherapy, NSTEMI 11/2016 with LHC showing 3V Dz requiring CABG, chronic systolic and diastolic heart failure with EF 30%, hypertension, hyperlipidemia.  Patient had a reduced EF of 30% at the time of his CABG.  Follow-up echocardiogram in 02/2017 demonstrated EF of 30-35% he was referred to electrophysiology and seen by Dr. Caryl Comes who noted relative sinus tachycardia.  His beta-blocker was adjusted.  It was felt he needed further titration of his heart failure medications prior to pursuing ICD implantation.  [name] was last seen in the office on 06/15/2017 by Richardson Dopp, PA at which time he remained volume overloaded.  Lasix and carvedilol were being adjusted.  He was continued on losartan and Spironolactone.  He was also counseled on sodium restriction and dietary improvements.  He was to return in 3 weeks, however this did not occur.  He has not been seen in the office since that time.  Our office received a request to hold Plavix for planned endoscopy and/or colonoscopy.   CBC, BMET, lipids   Past Medical History:  Diagnosis Date  . CAD (coronary artery disease) 11/16/2011   S/p inferior MI in 2001 treated with BMS to the RCA  //  restenosis treated with brachytherapy in 2001 // non-STEMI 8/18 >> LHC with 3 vessel CAD >> s/p CABG (LIMA-LAD, SVG-OM1/distal LCx, SVG-PDA/PLB) - [facility]  . Carotid stenosis    Preoperative carotid US 8/18: Bilateral ICA 1-39  . Chronic systolic CHF (congestive heart failure) ([place]) 11/29/2016   Ischemic CM // Echo 8/18: Diffuse HK worse in  the inferior wall, moderate LVH, EF 30, normal diastolic function, mild MR, moderate LAE, PASP 40 // Intra op TEE 8/18: EF 30-35, normal wall motion, trace MR  . Diabetes mellitus ([place])   . Hyperlipidemia   . Hypertension   . Myocardial infarction ([place])    OCT 2001-He under went angioplasty  and  stenting  of the RCA and distal RCA   . PAD (peripheral artery disease) ([facility])    Preoperative ABIs 8/18: R 0.93; L inaccurate due to calcification/noncompressible  . Stroke [facility])     Past Surgical History:  Procedure Laterality Date  . ANKLE SURGERY     Trauma  . CORONARY ARTERY BYPASS GRAFT N/A 12/07/2016   Procedure: CORONARY ARTERY BYPASS GRAFTING (CABG) X 5  WITH ENDOSCOPIC HARVESTING OF RIGHT SAPHENOUS VEIN , LIMA-LAD SEQ SVG-OM1-OM2 SEQ SVG-PD-PL;  Surgeon: [name], MD;  Location: [facility];  Service: Open Heart Surgery;  Laterality: N/A;  . fx left arm    . RIGHT/LEFT HEART CATH AND CORONARY ANGIOGRAPHY N/A 12/03/2016   Procedure: RIGHT/LEFT HEART CATH AND CORONARY ANGIOGRAPHY;  Surgeon: Martinique, Peter M, MD;  Location: [place] [facility];  Service: Cardiovascular;  Laterality: N/A;  . stent sx    . TEE WITHOUT CARDIOVERSION N/A 05/07/2015   Procedure: TRANSESOPHAGEAL ECHOCARDIOGRAM (TEE);  Surgeon: Thayer Headings, MD;  Location: [facility];  Service: Cardiovascular;  Laterality: N/A;  . TEE WITHOUT CARDIOVERSION N/A 12/07/2016   Procedure: TRANSESOPHAGEAL ECHOCARDIOGRAM (TEE);  Surgeon: [name], MD;  Location: [facility];  Service: Open Heart Surgery;  Laterality: N/A;    Current Medications: No outpatient medications have been marked as taking for the 01/03/19 encounter (Appointment) with [name], NP.     Allergies:   Patient has no known allergies.   Social History   Socioeconomic History  . Marital status: Single    Spouse name: Not on file  . Number of children: 0  . Years of education: Not on file  . Highest education level: Not on file  Occupational  History  . Occupation: Scientist, research (medical): [employer]  . Financial resource strain: Not on file  . Food insecurity    Worry: Not on file    Inability: Not on file  . Transportation needs    Medical: Not on file    Non-medical: Not on file  Tobacco Use  . Smoking status: Former Smoker    Packs/day: 2.00    Years: 20.00    Pack years: 40.00    Types: Cigarettes    Quit date: 06/18/1990    Years since quitting: 28.5  . Smokeless tobacco: Never Used  Substance and Sexual Activity  . Alcohol use: Yes    Alcohol/week: 1.0 standard drinks    Types: 1 Shots of liquor per week    Comment: occasioinally  . Drug use: No  . Sexual activity: Not Currently  Lifestyle  . Physical activity    Days per week: Not on file    Minutes per session: Not on file  . Stress: Not on file  Relationships  . Social Herbalist on phone: Not on file    Gets together: Not on file    Attends religious service: Not on file    Active member of club or organization: Not on file    Attends meetings of clubs or organizations: Not on file    Relationship status: Not on file  Other Topics Concern  . Not on file  Social History Narrative   Notable for remote tobacco abuse, with a prior three-pack per - day - history  For 20 years . He  Has not smoked since 1992.     Family History: The patient's family history includes Cancer in his mother; Heart attack in his father, maternal grandfather, paternal grandfather, and sister. ROS:   Please see the history of present illness.     All other systems reviewed and are negative.  EKGs/Labs/Other Studies Reviewed:    The following studies were reviewed today:  Echo 03/14/17 EF [id number], grade 1 diastolic dysfunction  Intraoperative TEE 12/07/16 EF 30-35, normal wall motion, trace MR  Carotid US 12/04/16 Bilateral ICA 1-39  ABIs 12/04/16 - Right ABI of 0.93 is suggestive of mild arterial occlusive disease at rest. - Left ABI could  not be accurately obtained due to non-compressible arteries likely due to medial calcification.  Cardiac Catheterization 12/03/16 LAD ostial LAD, mid 70 LCx ostial 50, proximal 100 RCA ostial 95, proximal 90, mid stent patent with 30 ISR, distal stent patent EF 25-35 Referred for CABG  Echocardiogram 11/30/16 Diffuse HK worse in the inferior wall, moderate LVH, EF 30, normal diastolic function, mild MR, moderate LAE, PASP 40  Event monitor 1/17 NSR  EKG:  EKG is *** ordered today.  The ekg ordered today demonstrates ***  Recent Labs: No results found for requested labs within last 8760 hours.   Recent Lipid Panel  Component Value Date/Time   CHOL 128 01/13/2017 1001   TRIG 194 (H) 01/13/2017 1001   HDL 28 (L) 01/13/2017 1001   CHOLHDL 4.6 01/13/2017 1001   CHOLHDL 2.2 11/30/2016 0503   VLDL 15 11/30/2016 0503   LDLCALC 61 01/13/2017 1001    Physical Exam:    VS:  There were no vitals taken for this visit.    Wt Readings from Last 3 Encounters:  06/15/17 182 lb (82.6 kg)  06/08/17 179 lb 12.8 oz (81.6 kg)  04/27/17 173 lb 9.6 oz (78.7 kg)     Physical Exam***   ASSESSMENT:    1. Chronic combined systolic and diastolic heart failure ([place])   2. Coronary artery disease involving native coronary artery of native heart without angina pectoris   3. Hyperlipidemia, unspecified hyperlipidemia type   4. Essential (primary) hypertension   5. Preop cardiovascular exam    PLAN:    In order of problems listed above:  Chronic combined systolic and diastolic heart failure -EF was found to be decreased at time of MI in 2018 requiring CABG surgery.  Follow-up echo in 02/2017 with EF 30-35%. -Patient was seen by electrophysiology, Dr. Caryl Comes, who advised optimization of medical therapy prior to consideration for ICD. -Patient was last seen in the office in 05/2017 at which time we were striving for medical optimization and he was still volume overloaded.  He has not been seen  since that time. -Carvedilol 18.75 mg twice daily, Lasix 40 mg daily, losartan 50 mg daily, spironolactone 25 mg daily  -We will continue optimization of medical therapy and plan for repeat echocardiogram once he achieves the benefit of optimal medical therapy.  CAD -History of MI in 2001 treated with BMS followed by brachii therapy for restenosis, status post CABG in the setting of non-STEMI in 11/2016. -Patient continues on aspirin, Plavix, beta-blocker, ARB and statin  Hyperlipidemia, goal LDL <70 -On atorvastatin 80 mg daily  Hypertension -  Plan for endoscopy/colonoscopy -   Medication Adjustments/Labs and Tests Ordered: Current medicines are reviewed at length with the patient today.  Concerns regarding medicines are outlined above. Labs and tests ordered and medication changes are outlined in the patient instructions below:  There are no Patient Instructions on file for this visit.   Signed, [name], NP  01/03/2019 4:44 AM    [place]

## 2021-02-24 DEATH — deceased
